# Patient Record
Sex: Male | Born: 1970 | Race: White | Hispanic: No | Smoking: Never smoker
Health system: Southern US, Community
[De-identification: ages and names within clinical notes are randomized; demographics above are authoritative.]

## PROBLEM LIST (undated history)

## (undated) DIAGNOSIS — F410 Panic disorder [episodic paroxysmal anxiety] without agoraphobia: Secondary | ICD-10-CM

## (undated) DIAGNOSIS — I872 Venous insufficiency (chronic) (peripheral): Secondary | ICD-10-CM

## (undated) DIAGNOSIS — K602 Anal fissure, unspecified: Secondary | ICD-10-CM

## (undated) DIAGNOSIS — G4730 Sleep apnea, unspecified: Secondary | ICD-10-CM

## (undated) DIAGNOSIS — M549 Dorsalgia, unspecified: Secondary | ICD-10-CM

## (undated) DIAGNOSIS — F1010 Alcohol abuse, uncomplicated: Secondary | ICD-10-CM

## (undated) DIAGNOSIS — I2510 Atherosclerotic heart disease of native coronary artery without angina pectoris: Secondary | ICD-10-CM

## (undated) DIAGNOSIS — R002 Palpitations: Secondary | ICD-10-CM

## (undated) DIAGNOSIS — I10 Essential (primary) hypertension: Secondary | ICD-10-CM

## (undated) DIAGNOSIS — R12 Heartburn: Secondary | ICD-10-CM

## (undated) DIAGNOSIS — R7303 Prediabetes: Secondary | ICD-10-CM

## (undated) DIAGNOSIS — K219 Gastro-esophageal reflux disease without esophagitis: Secondary | ICD-10-CM

## (undated) DIAGNOSIS — R569 Unspecified convulsions: Secondary | ICD-10-CM

## (undated) DIAGNOSIS — M7989 Other specified soft tissue disorders: Secondary | ICD-10-CM

## (undated) DIAGNOSIS — K921 Melena: Secondary | ICD-10-CM

## (undated) DIAGNOSIS — K580 Irritable bowel syndrome with diarrhea: Secondary | ICD-10-CM

## (undated) DIAGNOSIS — R079 Chest pain, unspecified: Secondary | ICD-10-CM

## (undated) DIAGNOSIS — E785 Hyperlipidemia, unspecified: Secondary | ICD-10-CM

## (undated) DIAGNOSIS — J45909 Unspecified asthma, uncomplicated: Secondary | ICD-10-CM

## (undated) DIAGNOSIS — R5600 Simple febrile convulsions: Secondary | ICD-10-CM

## (undated) DIAGNOSIS — R0602 Shortness of breath: Secondary | ICD-10-CM

## (undated) DIAGNOSIS — F329 Major depressive disorder, single episode, unspecified: Secondary | ICD-10-CM

## (undated) DIAGNOSIS — F419 Anxiety disorder, unspecified: Secondary | ICD-10-CM

## (undated) DIAGNOSIS — T7840XA Allergy, unspecified, initial encounter: Secondary | ICD-10-CM

## (undated) DIAGNOSIS — F1990 Other psychoactive substance use, unspecified, uncomplicated: Secondary | ICD-10-CM

## (undated) DIAGNOSIS — F32A Depression, unspecified: Secondary | ICD-10-CM

## (undated) HISTORY — DX: Unspecified asthma, uncomplicated: J45.909

## (undated) HISTORY — DX: Alcohol abuse, uncomplicated: F10.10

## (undated) HISTORY — DX: Depression, unspecified: F32.A

## (undated) HISTORY — DX: Major depressive disorder, single episode, unspecified: F32.9

## (undated) HISTORY — DX: Chest pain, unspecified: R07.9

## (undated) HISTORY — DX: Palpitations: R00.2

## (undated) HISTORY — PX: CARDIAC CATHETERIZATION: SHX172

## (undated) HISTORY — DX: Simple febrile convulsions: R56.00

## (undated) HISTORY — DX: Dorsalgia, unspecified: M54.9

## (undated) HISTORY — DX: Hyperlipidemia, unspecified: E78.5

## (undated) HISTORY — DX: Anal fissure, unspecified: K60.2

## (undated) HISTORY — DX: Prediabetes: R73.03

## (undated) HISTORY — DX: Melena: K92.1

## (undated) HISTORY — DX: Other specified soft tissue disorders: M79.89

## (undated) HISTORY — DX: Allergy, unspecified, initial encounter: T78.40XA

## (undated) HISTORY — DX: Sleep apnea, unspecified: G47.30

## (undated) HISTORY — DX: Panic disorder (episodic paroxysmal anxiety): F41.0

## (undated) HISTORY — PX: HEMORRHOID BANDING: SHX5850

## (undated) HISTORY — PX: SIGMOIDOSCOPY: SUR1295

## (undated) HISTORY — DX: Shortness of breath: R06.02

## (undated) HISTORY — DX: Heartburn: R12

## (undated) HISTORY — DX: Essential (primary) hypertension: I10

## (undated) HISTORY — DX: Gastro-esophageal reflux disease without esophagitis: K21.9

## (undated) HISTORY — DX: Venous insufficiency (chronic) (peripheral): I87.2

## (undated) HISTORY — DX: Anxiety disorder, unspecified: F41.9

## (undated) HISTORY — DX: Unspecified convulsions: R56.9

## (undated) HISTORY — DX: Other psychoactive substance use, unspecified, uncomplicated: F19.90

## (undated) HISTORY — DX: Irritable bowel syndrome with diarrhea: K58.0

---
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
## 2003-12-29 HISTORY — PX: COLONOSCOPY: SHX174

## 2012-11-22 ENCOUNTER — Ambulatory Visit (INDEPENDENT_AMBULATORY_CARE_PROVIDER_SITE_OTHER): Payer: BC Managed Care – PPO | Admitting: Family Medicine

## 2012-11-22 ENCOUNTER — Encounter: Payer: Self-pay | Admitting: Family Medicine

## 2012-11-22 VITALS — BP 136/82 | HR 72 | Temp 97.6°F | Resp 12 | Ht 69.0 in | Wt 278.0 lb

## 2012-11-22 DIAGNOSIS — F32A Depression, unspecified: Secondary | ICD-10-CM | POA: Insufficient documentation

## 2012-11-22 DIAGNOSIS — E669 Obesity, unspecified: Secondary | ICD-10-CM

## 2012-11-22 DIAGNOSIS — I10 Essential (primary) hypertension: Secondary | ICD-10-CM

## 2012-11-22 DIAGNOSIS — Z8719 Personal history of other diseases of the digestive system: Secondary | ICD-10-CM | POA: Insufficient documentation

## 2012-11-22 DIAGNOSIS — Z87898 Personal history of other specified conditions: Secondary | ICD-10-CM | POA: Insufficient documentation

## 2012-11-22 DIAGNOSIS — F410 Panic disorder [episodic paroxysmal anxiety] without agoraphobia: Secondary | ICD-10-CM | POA: Insufficient documentation

## 2012-11-22 DIAGNOSIS — F329 Major depressive disorder, single episode, unspecified: Secondary | ICD-10-CM | POA: Insufficient documentation

## 2012-11-22 DIAGNOSIS — Z0000 Encounter for general adult medical examination without abnormal findings: Secondary | ICD-10-CM

## 2012-11-22 DIAGNOSIS — K219 Gastro-esophageal reflux disease without esophagitis: Secondary | ICD-10-CM | POA: Insufficient documentation

## 2012-11-22 LAB — POCT URINALYSIS DIPSTICK
Bilirubin, UA: NEGATIVE
Blood, UA: NEGATIVE
Glucose, UA: NEGATIVE
Ketones, UA: NEGATIVE
Leukocytes, UA: NEGATIVE
Nitrite, UA: NEGATIVE
Protein, UA: NEGATIVE
Spec Grav, UA: 1.02
Urobilinogen, UA: 0.2
pH, UA: 7

## 2012-11-22 LAB — BASIC METABOLIC PANEL WITH GFR
BUN: 21 mg/dL (ref 6–23)
CO2: 26 meq/L (ref 19–32)
Calcium: 8.9 mg/dL (ref 8.4–10.5)
Chloride: 104 meq/L (ref 96–112)
Creatinine, Ser: 1.1 mg/dL (ref 0.4–1.5)
GFR: 76.75 mL/min
Glucose, Bld: 93 mg/dL (ref 70–99)
Potassium: 3.7 meq/L (ref 3.5–5.1)
Sodium: 138 meq/L (ref 135–145)

## 2012-11-22 LAB — HEPATIC FUNCTION PANEL
ALT: 38 U/L (ref 0–53)
AST: 29 U/L (ref 0–37)
Albumin: 4 g/dL (ref 3.5–5.2)
Alkaline Phosphatase: 65 U/L (ref 39–117)
Bilirubin, Direct: 0.1 mg/dL (ref 0.0–0.3)
Total Bilirubin: 0.9 mg/dL (ref 0.3–1.2)
Total Protein: 7.2 g/dL (ref 6.0–8.3)

## 2012-11-22 LAB — CBC WITH DIFFERENTIAL/PLATELET
Basophils Absolute: 0 10*3/uL (ref 0.0–0.1)
Eosinophils Absolute: 0.2 10*3/uL (ref 0.0–0.7)
Lymphocytes Relative: 27.3 % (ref 12.0–46.0)
MCHC: 33.6 g/dL (ref 30.0–36.0)
Neutrophils Relative %: 62 % (ref 43.0–77.0)
RBC: 5.01 Mil/uL (ref 4.22–5.81)
RDW: 12.7 % (ref 11.5–14.6)

## 2012-11-22 LAB — TSH: TSH: 1.44 u[IU]/mL (ref 0.35–5.50)

## 2012-11-22 LAB — LIPID PANEL
Cholesterol: 193 mg/dL (ref 0–200)
LDL Cholesterol: 129 mg/dL — ABNORMAL HIGH (ref 0–99)
Triglycerides: 151 mg/dL — ABNORMAL HIGH (ref 0.0–149.0)
VLDL: 30.2 mg/dL (ref 0.0–40.0)

## 2012-11-22 NOTE — Patient Instructions (Signed)
Work on weight loss and overall calorie restriction. We will call you with nutrition referral.

## 2012-11-22 NOTE — Progress Notes (Signed)
Subjective:    Patient ID: Hector [name], male    DOB: 11-Aug-1971, 41 y.o.   MRN: [id number]  HPI  New to establish. Patient requesting wellness visit. Past medical history reviewed. Past history of anal fissures. He's had anxiety disorder with what sounds like panic disorder. Occasional episode of depression. He is maintained on Prozac 20 mg daily for several years and this is doing a fairly good job suppressing anxiety symptoms. He has occasional breakthrough symptoms and has used low-dose alprazolam for that. He has history of GERD currently stable off medication. History of physiologic jaundice at birth. Hypertension treated with beta blocker- Bystolic 10 mg once daily. Patient also has history of febrile seizures in childhood but no seizure since then. Only surgery was wisdom teeth extraction.  Family history significant for mother with Alzheimer's disease. His father had history of hypertension and type 2 diabetes. Hyperlipidemia in both parents.  Gradual weight gain over the past several years. No regular exercise. Nonsmoker. Occasional alcohol use. Tetanus 2010. Flu vaccine earlier this year. Colonoscopy 2005 to evaluate his rectal bleeding with apparently no significant abnormalities.  Past Medical History  Diagnosis Date  . Hypertension   . Depression   . Anxiety   . Seizures     febrile  . Jaundice, newborn   . Blood in stool   . Panic disorder   . Allergy    Past Surgical History  Procedure Date  . Wisdom tooth extraction 1991    reports that he has never smoked. He does not have any smokeless tobacco history on file. His alcohol and drug histories not on file. family history includes Dementia in his mother; Diabetes in his father; Heart disease (age of onset:58) in his father; Hyperlipidemia in his father; Hypertension in his mother; Skin cancer in his mother; and Stroke in his father and mother. No Known Allergies    Review of Systems  Constitutional: Positive for  unexpected weight change (Weight gain, not really unexpected). Negative for fever, activity change, appetite change and fatigue.  HENT: Negative for ear pain, congestion and trouble swallowing.   Eyes: Negative for pain and visual disturbance.  Respiratory: Negative for cough, shortness of breath and wheezing.   Cardiovascular: Negative for chest pain and palpitations.  Gastrointestinal: Negative for nausea, vomiting, abdominal pain, diarrhea, constipation, blood in stool, abdominal distention and rectal pain.  Genitourinary: Negative for dysuria, hematuria and testicular pain.  Musculoskeletal: Negative for joint swelling and arthralgias.  Skin: Negative for rash.  Neurological: Negative for dizziness, syncope and headaches.  Hematological: Negative for adenopathy.  Psychiatric/Behavioral: Negative for confusion and dysphoric mood.       Objective:   Physical Exam  Constitutional: He is oriented to person, place, and time. He appears well-developed and well-nourished. No distress.       Obese pleasant gentleman in no distress  HENT:  Head: Normocephalic and atraumatic.  Right Ear: External ear normal.  Left Ear: External ear normal.  Mouth/Throat: Oropharynx is clear and moist.  Eyes: Conjunctivae normal and EOM are normal. Pupils are equal, round, and reactive to light.  Neck: Normal range of motion. Neck supple. No thyromegaly present.  Cardiovascular: Normal rate, regular rhythm and normal heart sounds.   No murmur heard. Pulmonary/Chest: No respiratory distress. He has no wheezes. He has no rales.  Abdominal: Soft. Bowel sounds are normal. He exhibits no distension and no mass. There is no tenderness. There is no rebound and no guarding.  Musculoskeletal: He exhibits no edema.  Lymphadenopathy:    He has no cervical adenopathy.  Neurological: He is alert and oriented to person, place, and time. He displays normal reflexes. No cranial nerve deficit.  Skin: No rash noted.    Psychiatric: He has a normal mood and affect.          Assessment & Plan:  Health maintenance. Patient has history of hypertension which has been marginally controlled. Consistent weight gain apparently over several years. Obtain screening labs. We talked at some length about strategies for weight loss. Set up nutrition referral. Routine followup 6 months  Anxiety disorder (?panic disorder) mostly controlled with Prozac with prn alprazolam.

## 2012-11-23 NOTE — Progress Notes (Signed)
Quick Note:  Pt informed on personally identified ______ 

## 2012-11-29 ENCOUNTER — Encounter: Payer: Self-pay | Admitting: Family Medicine

## 2012-12-06 ENCOUNTER — Encounter: Payer: Self-pay | Admitting: Family Medicine

## 2012-12-08 MED ORDER — METOPROLOL SUCCINATE ER 50 MG PO TB24: 50.0000 mg | ORAL_TABLET | Freq: Every day | ORAL | Status: DC

## 2012-12-08 NOTE — Telephone Encounter (Signed)
Pt informed Rx sent to [facility]

## 2012-12-30 ENCOUNTER — Encounter: Payer: BC Managed Care – PPO | Attending: Family Medicine | Admitting: Dietician

## 2012-12-30 ENCOUNTER — Encounter: Payer: Self-pay | Admitting: Dietician

## 2012-12-30 VITALS — Ht 69.5 in | Wt 279.6 lb

## 2012-12-30 DIAGNOSIS — Z713 Dietary counseling and surveillance: Secondary | ICD-10-CM | POA: Insufficient documentation

## 2012-12-30 DIAGNOSIS — E669 Obesity, unspecified: Secondary | ICD-10-CM

## 2012-12-30 NOTE — Patient Instructions (Addendum)
Goals:  Pt will incorporate 30 minutes for 5 days a week of physical activity such as walking. These should be done in at least 10 minutes increments ideally. Pt will reduce portion size of meats to 4-5 oz. (approximately 1.5 decks of cards), and follow plate method to make half of dinner plate vegetables, 1/4 starch, and 1/4 protein.  Pt will incorporate extra vegetables into meals that traditionally have little- such as large salads with pizza or lots of vegetable toppings with pizzas.  Because pt dislikes leftovers, he and his wife will cook smaller amounts of meats, starches for dinner to reduce waste, encourage eating of more vegetables for fullness. Pt will eat more fruit in response to cravings for sweets, eating fruit at least once per day.  Pt will choose leaner items for breakfast, such as an egg with fruit, or whole grain cereals such as Kashi and Honey Nut Cheerios. Pt may consider slim fast shake as meal replacement for on-the-go eating. Pt will pack lunch 3 days per week. In his lunch, pt will include a whole grain item (such as whole corn or corn tortilla, whole wheat bread, or baked potato), one or more veg/fruit, and one lean protein (fish, poultry, tofu, or loin cut of red meat).  Pt will continue to choose lighter items for dessert, and will control portion to single servings (one pudding cup, for example). Pt will choose lighter forms of fatty dressing, such as oil-based salad dressing and plain yogurt in favor of sour cream.

## 2012-12-30 NOTE — Progress Notes (Signed)
Medical Nutrition Therapy:  Appt start time: 1030 end time:  1130.  Assessment:  Primary concerns today: wt gain.   Pt reports ankle/foot injury during last attempt at wt reduction (2011- was down to 243 from about 270), which has reduced PA to essentially none, and gradual wt gain since then to current wt.  MEDICATIONS: see list.   DIETARY INTAKE:  Usual eating pattern includes 3 meals and 1 snacks per day.  Everyday foods include granola bars, diet sodas.  Avoided foods include shellfish, cauliflower, broccoli, carrots.  24-hr recall:  B ( AM): chicken biscuit, with clif bar or chewy granola bar. Sometimes has a smartones breakfast sandwich. Banana Snk ( AM): none  L ( PM): goes out basically every day for lunch: [employer] sub (italian night club on french bread + chips and pickle), indian (rice, naan, chix, rice pudding- 2 plates worth) or [place] food (chix or beef curries with rice- 2 plates), [place] food ([place] chix or beef, lots of chips and salsa, plus rice and refried beans). Snk ( PM): candy bar (normal size), but recently has stopped D ( PM): wife cooks- usually cooks vegetarian chili with frozen chix, pasta with red sauce, or light cream sauce, frozen pizza, tilapia to grill, breaded filet of fish, tuna casserole, honey mustard chix or meatloaf on occasion. Starches- mashed potatoes, frozen french fries, white rice or jasmine rice- mainly mashed potatoes and french fries as starch, corn, sometimes garlic bread. Veg- corn as a veg, tomatoes, green beans, asparagus, garlic, onions, spicy and mild peppers.  Snk ( PM): ice cream (light). Craves sweets after dinner, so ate froot loops. Sometimes eats granola bar. Fat free or sugar free pudding.  Beverages: unsweetened almond milk, artificially sweetened soda (with stevia). Sometimes gets regular soda when he goes out to eat. Drinks about 20 oz. Bottled water per day. Drinks some sparkling water.   Pt states he likes a lot of  fruits, but doesn't eat them regularly.   Pt states significant cravings for a number of foods, which he attempts to control.  Usual physical activity: essentially none.   Progress Towards Goal(s):  In progress.   Nutritional Diagnosis:  NI-1.5 Excessive energy intake As related to very low PA, large portion sizes.  As evidenced by BMI>30.    Intervention:  Nutrition counseling provided regarding portion sizes, meal composition, water, fruit and vegetable consumption. Pt counseled regarding choosing lower fat options, reducing kcal density of foods generally, controlling portions, especially of meat and starch.   Pt counseled regarding increasing physical activity as tolerated.   Goals:  Pt will incorporate 30 minutes for 5 days a week of physical activity such as walking. These should be done in at least 10 minutes increments ideally. Pt will reduce portion size of meats to 4-5 oz. (approximately 1.5 decks of cards), and follow plate method to make half of dinner plate vegetables, 1/4 starch, and 1/4 protein.  Pt will incorporate extra vegetables into meals that traditionally have little- such as large salads with pizza or lots of vegetable toppings with pizzas.  Because pt dislikes leftovers, he and his wife will cook smaller amounts of meats, starches for dinner to reduce waste, encourage eating of more vegetables for fullness. Pt will eat more fruit in response to cravings for sweets, eating fruit at least once per day.  Pt will choose leaner items for breakfast, such as an egg with fruit, or whole grain cereals such as Kashi and Honey Nut Cheerios. Pt may  consider slim fast shake as meal replacement for on-the-go eating. Pt will pack lunch 3 days per week. In his lunch, pt will include a whole grain item (such as whole corn or corn tortilla, whole wheat bread, or baked potato), one or more veg/fruit, and one lean protein (fish, poultry, tofu, or loin cut of red meat).  Pt will continue  to choose lighter items for dessert, and will control portion to single servings (one pudding cup, for example). Pt will choose lighter forms of fatty dressing, such as oil-based salad dressing and plain yogurt in favor of sour cream.  Handouts given during visit include:  serving size card  Plate method  Monitoring/Evaluation:  Dietary intake, exercise, portion control, and body weight in 1 month(s).

## 2013-02-03 ENCOUNTER — Ambulatory Visit: Payer: BC Managed Care – PPO | Admitting: Dietician

## 2013-02-12 ENCOUNTER — Other Ambulatory Visit: Payer: Self-pay

## 2013-04-24 ENCOUNTER — Telehealth: Payer: Self-pay | Admitting: Family Medicine

## 2013-04-24 MED ORDER — FLUOXETINE HCL 20 MG PO CAPS: 20.0000 mg | ORAL_CAPSULE | Freq: Every day | ORAL | Status: DC

## 2013-04-24 NOTE — Telephone Encounter (Signed)
[address] [place], [place] [address] p. [phone] f. [phone] To: [facility] (After Hours Triage) Fax: [phone] From: [facility] Date/ Time: 04/21/2013 6:15 PM Taken By: Jethro Bolus: [place] Facility: not collected Patient: Hector [name], Hector [name] DOB: July 16, 1971 Phone: [phone] Reason for Call: He is out of his Ativan 0.5mg  tabs. Has been taking an expired script all week and told his wife this morning. He called but didn't leave a message per [facility]. She said that he will be fine over the weekend, just needs to get a short term amount called to [facility] [address] Monday am and then a script faxed to [facility] for mail order. Also needs a script sent to [facility] for the Prozac. Has 6 pills left to get through the weekend. Neither of these scripts have previously been ordered by Dr. Caryl Never, just discussed on his initial visit. DMM/RN/CAN Regarding Appointment: Appt Date: Appt Time: Unknown Confidential

## 2013-04-24 NOTE — Telephone Encounter (Signed)
This was a new patient to establish on 11/22/12 and you had requested a return OV in 6 months.  These meds have not been filled by [place] yet, so he would be due for OV end of May.   Please advise

## 2013-04-24 NOTE — Telephone Encounter (Signed)
Refill Prozac for one month and schedule follow up. Our med list states alprazolam, not Ativan.

## 2013-05-16 ENCOUNTER — Telehealth: Payer: Self-pay | Admitting: Family Medicine

## 2013-05-16 MED ORDER — NEBIVOLOL HCL 10 MG PO TABS: 10.0000 mg | ORAL_TABLET | Freq: Every day | ORAL | Status: DC

## 2013-05-16 NOTE — Telephone Encounter (Signed)
Patient's wife called stating that her husband needs 3 of the bystolic 10 mg 1poqd sent to [facility] [address] until he receives his mail order rx. Please assist.

## 2013-05-26 ENCOUNTER — Other Ambulatory Visit: Payer: Self-pay | Admitting: Family Medicine

## 2013-05-29 ENCOUNTER — Encounter: Payer: Self-pay | Admitting: Family Medicine

## 2013-05-29 ENCOUNTER — Ambulatory Visit (INDEPENDENT_AMBULATORY_CARE_PROVIDER_SITE_OTHER): Payer: BC Managed Care – PPO | Admitting: Family Medicine

## 2013-05-29 VITALS — BP 120/78 | HR 68 | Temp 97.5°F | Resp 18 | Wt 272.0 lb

## 2013-05-29 DIAGNOSIS — I10 Essential (primary) hypertension: Secondary | ICD-10-CM

## 2013-05-29 DIAGNOSIS — F410 Panic disorder [episodic paroxysmal anxiety] without agoraphobia: Secondary | ICD-10-CM

## 2013-05-29 MED ORDER — LORAZEPAM 1 MG PO TABS: 1.0000 mg | ORAL_TABLET | Freq: Four times a day (QID) | ORAL | Status: DC | PRN

## 2013-05-29 MED ORDER — FLUOXETINE HCL 20 MG PO CAPS: ORAL_CAPSULE | ORAL | Status: DC

## 2013-05-29 MED ORDER — NEBIVOLOL HCL 10 MG PO TABS: 10.0000 mg | ORAL_TABLET | Freq: Every day | ORAL | Status: DC

## 2013-05-29 NOTE — Progress Notes (Signed)
  Subjective:    Patient ID: Hector [name], male    DOB: 02-06-1971, 42 y.o.   MRN: [id number]  HPI Medical followup. Patient has history of hypertension, panic disorder, GERD. Takes fluoxetine 20 mg daily. Panic disorder generally fairly well controlled. In the past has taken alprazolam as needed. Generally has about one panic attack every 2-4 weeks. He's also use benzodiazepines for thing like flying. Job requires travel.  Hypertension treated with by Bystolic 10 mg daily. Blood pressure well controlled. No dizziness. No side effects.  Patient is nonsmoker. Recently joined [employer].  Past Medical History  Diagnosis Date  . Hypertension   . Depression   . Anxiety   . Seizures     febrile  . Jaundice, newborn   . Blood in stool   . Panic disorder   . Allergy    Past Surgical History  Procedure Laterality Date  . Wisdom tooth extraction  1991    reports that he has never smoked. He does not have any smokeless tobacco history on file. His alcohol and drug histories are not on file. family history includes Dementia in his mother; Diabetes in his father; Heart disease (age of onset: 59) in his father; Hyperlipidemia in his father; Hypertension in his mother; Skin cancer in his mother; and Stroke in his father and mother. No Known Allergies    Review of Systems  Constitutional: Negative for fatigue.  Eyes: Negative for visual disturbance.  Respiratory: Negative for cough, chest tightness and shortness of breath.   Cardiovascular: Negative for chest pain, palpitations and leg swelling.  Neurological: Negative for dizziness, syncope, weakness, light-headedness and headaches.       Objective:   Physical Exam  Constitutional: He appears well-developed and well-nourished.  HENT:  Right Ear: External ear normal.  Left Ear: External ear normal.  Mouth/Throat: Oropharynx is clear and moist.  Neck: Neck supple. No thyromegaly present.  Cardiovascular: Normal rate and  regular rhythm.  Exam reveals no gallop.   No murmur heard. Pulmonary/Chest: Effort normal and breath sounds normal. No respiratory distress. He has no wheezes. He has no rales.  Musculoskeletal: He exhibits no edema.  Lymphadenopathy:    He has no cervical adenopathy.          Assessment & Plan:  #1 hypertension. Adequate control. Refill medication for one year #2 history of panic disorder. Generally well-controlled. Refill fluoxetine for one year. Limited lorazepam 1 mg when necessary for severe anxiety. He'll also use pre-flying

## 2013-11-02 ENCOUNTER — Other Ambulatory Visit: Payer: Self-pay | Admitting: Family Medicine

## 2013-11-02 ENCOUNTER — Other Ambulatory Visit: Payer: Self-pay

## 2013-12-11 ENCOUNTER — Encounter: Payer: Self-pay | Admitting: Physician Assistant

## 2013-12-11 ENCOUNTER — Ambulatory Visit (INDEPENDENT_AMBULATORY_CARE_PROVIDER_SITE_OTHER): Payer: BC Managed Care – PPO | Admitting: Physician Assistant

## 2013-12-11 VITALS — BP 128/74 | HR 65 | Temp 97.7°F | Resp 20 | Wt 275.0 lb

## 2013-12-11 DIAGNOSIS — J020 Streptococcal pharyngitis: Secondary | ICD-10-CM

## 2013-12-11 DIAGNOSIS — J029 Acute pharyngitis, unspecified: Secondary | ICD-10-CM

## 2013-12-11 MED ORDER — AMOXICILLIN 875 MG PO TABS: 875.0000 mg | ORAL_TABLET | Freq: Two times a day (BID) | ORAL | Status: DC

## 2013-12-11 NOTE — Patient Instructions (Signed)
Increase fluid intake.  Rest.  Take antibiotic as prescribed with food.  Saline nasal spray.  Daily claritin.  Warm liquids, salt-water gargles and Tylenol for throat pain.  Change out toothbrush in 48 hours of being on antibiotic.

## 2013-12-12 ENCOUNTER — Encounter: Payer: Self-pay | Admitting: Physician Assistant

## 2013-12-12 DIAGNOSIS — J020 Streptococcal pharyngitis: Secondary | ICD-10-CM | POA: Insufficient documentation

## 2013-12-12 NOTE — Assessment & Plan Note (Signed)
Rapid strep positive.  Rx Amoxicillin 875 BID x 10 days.  Probiotic.  Rest.  Increase fluid intake.  Tylenol, salt-water gargles for throat pain.  Humidifier in bedroom.  Saline nasal spray.  Patient contagious for 24 hours after initiation of antibiotic therapy.  Note given for work.  Change out toothbrush after being on antibiotics to avoid re-inoculation.

## 2013-12-12 NOTE — Progress Notes (Signed)
Patient ID: Hector [name], male   DOB: [birth year], 42 y.o.   MRN: [id number]  Patient presents to clinic today c/o 2 days of a severely sore throat, intermittent fevers and sinus pressure.  Patient also endorses a mild, dry cough since this morning.  Patient denies hx of asthma, denies shortness of breath or wheezing.  Denies sinus pain.  Endorses mild R ear pain.  Denies rash.  Daughter recently with strep throat.  Denies recent travel.   Past Medical History  Diagnosis Date  . Hypertension   . Depression   . Anxiety   . Seizures     febrile  . Jaundice, newborn   . Blood in stool   . Panic disorder   . Allergy     Current Outpatient Prescriptions on File Prior to Visit  Medication Sig Dispense Refill  . FLUoxetine (PROZAC) 20 MG capsule TAKE ONE CAPSULE BY MOUTH DAILY  15 capsule  0  . FLUoxetine (PROZAC) 20 MG capsule TAKE 1 CAPSULE DAILY  90 capsule  1  . LORazepam (ATIVAN) 1 MG tablet Take 1 tablet (1 mg total) by mouth every 6 (six) hours as needed for anxiety.  30 tablet  2  . nebivolol (BYSTOLIC) 10 MG tablet Take 1 tablet (10 mg total) by mouth daily.  90 tablet  1  . clotrimazole-betamethasone (LOTRISONE) cream Apply 1 application topically 2 (two) times daily as needed.        No current facility-administered medications on file prior to visit.    No Known Allergies  Family History  Problem Relation Age of Onset  . Dementia Mother   . Stroke Mother   . Hypertension Mother   . Skin cancer Mother     basal and squamos  . Diabetes Father   . Hyperlipidemia Father   . Stroke Father   . Heart disease Father 57    History   Social History  . Marital Status: Married    Spouse Name: N/A    Number of Children: N/A  . Years of Education: N/A   Social History Main Topics  . Smoking status: Never Smoker   . Smokeless tobacco: Not on file  . Alcohol Use: Not on file  . Drug Use: Not on file  . Sexual Activity: Not on file   Other Topics Concern  . Not on file    Social History Narrative  . No narrative on file   Review of Systems - See HPI.  All other ROS are negative.  Filed Vitals:   12/11/13 1642  BP: 128/74  Pulse: 65  Temp: 97.7 F (36.5 C)  Resp: 20   Physical Exam  Vitals reviewed. Constitutional: He is oriented to person, place, and time and well-developed, well-nourished, and in no distress.  HENT:  Head: Normocephalic and atraumatic.  Right Ear: External ear normal.  Left Ear: External ear normal.  Nose: Nose normal.  Mouth/Throat: Oropharyngeal exudate present.  TM within normal limits bilaterally.  No tenderness to percussion of sinuses noted.  Positive posterior oropharyngeal erythema, tonsillar adenopathy and tonsillar exudate.  Uvula midline.  No evidence of peritonsillar abscess on examination.  Eyes: Conjunctivae are normal.  Neck: Neck supple.  Cardiovascular: Normal rate, regular rhythm, normal heart sounds and intact distal pulses.   Pulmonary/Chest: Effort normal and breath sounds normal. No respiratory distress. He has no wheezes. He has no rales. He exhibits no tenderness.  Lymphadenopathy:    He has cervical adenopathy.  Neurological: He is alert and  oriented to person, place, and time.  Skin: Skin is warm and dry. No rash noted.  Psychiatric: Affect normal.     Recent Results (from the past 2160 hour(s))  POCT RAPID STREP A (OFFICE)     Status: Abnormal   Collection Time    12/11/13  5:09 PM      Result Value Range   Rapid Strep A Screen Positive (*) Negative    Assessment/Plan: Streptococcal sore throat Rapid strep positive.  Rx Amoxicillin 875 BID x 10 days.  Probiotic.  Rest.  Increase fluid intake.  Tylenol, salt-water gargles for throat pain.  Humidifier in bedroom.  Saline nasal spray.  Patient contagious for 24 hours after initiation of antibiotic therapy.  Note given for work.  Change out toothbrush after being on antibiotics to avoid re-inoculation.

## 2014-01-08 ENCOUNTER — Telehealth: Payer: Self-pay | Admitting: Family Medicine

## 2014-01-08 ENCOUNTER — Other Ambulatory Visit: Payer: Self-pay | Admitting: Family Medicine

## 2014-01-08 NOTE — Telephone Encounter (Signed)
Refill once 

## 2014-01-08 NOTE — Telephone Encounter (Signed)
Last visit 05/29/13 Last refill 05/29/13 #30 2 refill

## 2014-01-08 NOTE — Telephone Encounter (Addendum)
Pt request refill of LORazepam (ATIVAN) 1 MG tablet Pt has cpe scheduled 01/22/14 [facility]

## 2014-01-09 MED ORDER — LORAZEPAM 1 MG PO TABS: 1.0000 mg | ORAL_TABLET | Freq: Four times a day (QID) | ORAL | Status: DC | PRN

## 2014-01-09 NOTE — Telephone Encounter (Signed)
RX called into pharmacy

## 2014-01-15 ENCOUNTER — Other Ambulatory Visit: Payer: Self-pay | Admitting: Family Medicine

## 2014-01-17 ENCOUNTER — Other Ambulatory Visit (INDEPENDENT_AMBULATORY_CARE_PROVIDER_SITE_OTHER): Payer: BC Managed Care – PPO

## 2014-01-17 DIAGNOSIS — Z0000 Encounter for general adult medical examination without abnormal findings: Secondary | ICD-10-CM

## 2014-01-17 LAB — POCT URINALYSIS DIPSTICK
BILIRUBIN UA: NEGATIVE
Blood, UA: NEGATIVE
GLUCOSE UA: NEGATIVE
Ketones, UA: NEGATIVE
LEUKOCYTES UA: NEGATIVE
NITRITE UA: NEGATIVE
Protein, UA: NEGATIVE
Spec Grav, UA: >=1.03
Urobilinogen, UA: 0.2
pH, UA: 5.5

## 2014-01-17 LAB — CBC WITH DIFFERENTIAL/PLATELET
BASOS ABS: 0 10*3/uL (ref 0.0–0.1)
BASOS PCT: 0.2 % (ref 0.0–3.0)
EOS ABS: 0.2 10*3/uL (ref 0.0–0.7)
Eosinophils Relative: 2.7 % (ref 0.0–5.0)
HCT: 44.4 % (ref 39.0–52.0)
Hemoglobin: 15.5 g/dL (ref 13.0–17.0)
Lymphocytes Relative: 25 % (ref 12.0–46.0)
Lymphs Abs: 1.6 10*3/uL (ref 0.7–4.0)
MCHC: 34.9 g/dL (ref 30.0–36.0)
MCV: 87.8 fl (ref 78.0–100.0)
MONO ABS: 0.4 10*3/uL (ref 0.1–1.0)
Monocytes Relative: 6 % (ref 3.0–12.0)
NEUTROS ABS: 4.2 10*3/uL (ref 1.4–7.7)
NEUTROS PCT: 66.1 % (ref 43.0–77.0)
Platelets: 169 10*3/uL (ref 150.0–400.0)
RBC: 5.06 Mil/uL (ref 4.22–5.81)
RDW: 12.7 % (ref 11.5–14.6)
WBC: 6.3 10*3/uL (ref 4.5–10.5)

## 2014-01-17 LAB — BASIC METABOLIC PANEL
BUN: 15 mg/dL (ref 6–23)
CHLORIDE: 106 meq/L (ref 96–112)
CO2: 28 meq/L (ref 19–32)
CREATININE: 1.1 mg/dL (ref 0.4–1.5)
Calcium: 8.9 mg/dL (ref 8.4–10.5)
GFR: 79.59 mL/min (ref >60.00)
GLUCOSE: 88 mg/dL (ref 70–99)
Potassium: 3.9 mEq/L (ref 3.5–5.1)
Sodium: 140 mEq/L (ref 135–145)

## 2014-01-17 LAB — LIPID PANEL
CHOL/HDL RATIO: 5
Cholesterol: 175 mg/dL (ref 0–200)
HDL: 32.2 mg/dL — ABNORMAL LOW (ref >39.00)
LDL Cholesterol: 117 mg/dL — ABNORMAL HIGH (ref 0–99)
TRIGLYCERIDES: 131 mg/dL (ref 0.0–149.0)
VLDL: 26.2 mg/dL (ref 0.0–40.0)

## 2014-01-17 LAB — TSH: TSH: 1.8 u[IU]/mL (ref 0.35–5.50)

## 2014-01-17 LAB — HEPATIC FUNCTION PANEL
ALK PHOS: 70 U/L (ref 39–117)
ALT: 26 U/L (ref 0–53)
AST: 22 U/L (ref 0–37)
Albumin: 3.9 g/dL (ref 3.5–5.2)
BILIRUBIN DIRECT: 0.1 mg/dL (ref 0.0–0.3)
TOTAL PROTEIN: 7.1 g/dL (ref 6.0–8.3)
Total Bilirubin: 0.9 mg/dL (ref 0.3–1.2)

## 2014-01-22 ENCOUNTER — Encounter: Payer: Self-pay | Admitting: Family Medicine

## 2014-01-22 ENCOUNTER — Ambulatory Visit (INDEPENDENT_AMBULATORY_CARE_PROVIDER_SITE_OTHER): Payer: BC Managed Care – PPO | Admitting: Family Medicine

## 2014-01-22 VITALS — BP 128/70 | HR 58 | Temp 97.4°F | Ht 69.0 in | Wt 275.0 lb

## 2014-01-22 DIAGNOSIS — Z0000 Encounter for general adult medical examination without abnormal findings: Secondary | ICD-10-CM

## 2014-01-22 DIAGNOSIS — Z6839 Body mass index (BMI) 39.0-39.9, adult: Secondary | ICD-10-CM | POA: Insufficient documentation

## 2014-01-22 NOTE — Progress Notes (Signed)
   Subjective:    Patient ID: Hector [name], male    DOB: [birth year], 43 y.o.   MRN: [id number]  HPI Patient here for complete physical He has hypertension treated with Bystolic and fluoxetine for history of depression. Both are stable. His weight is relatively unchanged from last year. He does not exercise. He has previously done Weight Watchers but not currently. Poor compliance at times.  Family history is reviewed as below.  has a brother that has been diagnosed with ulcerative colitis. Patient has not had any diarrhea or stool changes. He had previous colonoscopy 2005 reportedly normal.  Past Medical History  Diagnosis Date  . Hypertension   . Depression   . Anxiety   . Seizures     febrile  . Jaundice, newborn   . Blood in stool   . Panic disorder   . Allergy    Past Surgical History  Procedure Laterality Date  . Wisdom tooth extraction  1991    reports that he has never smoked. He does not have any smokeless tobacco history on file. His alcohol and drug histories are not on file. family history includes Colitis in his brother; Dementia in his mother; Diabetes in his father; Heart disease (age of onset: 47) in his father; Hyperlipidemia in his father; Hypertension in his mother; Skin cancer in his mother; Stroke in his father and mother. No Known Allergies    Review of Systems  Constitutional: Negative for fever, activity change, appetite change and fatigue.  HENT: Negative for congestion, ear pain and trouble swallowing.   Eyes: Negative for pain and visual disturbance.  Respiratory: Negative for cough, shortness of breath and wheezing.   Cardiovascular: Negative for chest pain and palpitations.  Gastrointestinal: Negative for nausea, vomiting, abdominal pain, diarrhea, constipation, blood in stool, abdominal distention and rectal pain.  Endocrine: Negative for polydipsia and polyuria.  Genitourinary: Negative for dysuria, hematuria and testicular pain.    Musculoskeletal: Negative for arthralgias and joint swelling.  Skin: Negative for rash.  Neurological: Negative for dizziness, syncope and headaches.  Hematological: Negative for adenopathy.  Psychiatric/Behavioral: Negative for confusion and dysphoric mood.       Objective:   Physical Exam  Constitutional: He is oriented to person, place, and time. He appears well-developed and well-nourished. No distress.  HENT:  Head: Normocephalic and atraumatic.  Right Ear: External ear normal.  Left Ear: External ear normal.  Mouth/Throat: Oropharynx is clear and moist.  Eyes: Conjunctivae and EOM are normal. Pupils are equal, round, and reactive to light.  Neck: Normal range of motion. Neck supple. No thyromegaly present.  Cardiovascular: Normal rate, regular rhythm and normal heart sounds.   No murmur heard. Pulmonary/Chest: No respiratory distress. He has no wheezes. He has no rales.  Abdominal: Soft. Bowel sounds are normal. He exhibits no distension and no mass. There is no tenderness. There is no rebound and no guarding.  Genitourinary: Rectum normal and prostate normal.  Musculoskeletal: He exhibits no edema.  Lymphadenopathy:    He has no cervical adenopathy.  Neurological: He is alert and oriented to person, place, and time. He displays normal reflexes. No cranial nerve deficit.  Skin: No rash noted.  Psychiatric: He has a normal mood and affect.          Assessment & Plan:  Complete physical. Labs reviewed. History metabolic syndrome and triglycerides are now normal. We have strongly advocate weight loss and discussed some strategies. Continue current medications.

## 2014-01-22 NOTE — Patient Instructions (Signed)
Try to lose some weight. Continue current medications.

## 2014-01-22 NOTE — Progress Notes (Signed)
Pre visit review using our clinic review tool, if applicable. No additional management support is needed unless otherwise documented below in the visit note. 

## 2014-03-05 ENCOUNTER — Encounter: Payer: Self-pay | Admitting: Family

## 2014-03-05 ENCOUNTER — Ambulatory Visit (INDEPENDENT_AMBULATORY_CARE_PROVIDER_SITE_OTHER): Payer: BC Managed Care – PPO | Admitting: Family

## 2014-03-05 VITALS — BP 110/80 | HR 77 | Temp 97.3°F | Wt 259.0 lb

## 2014-03-05 DIAGNOSIS — A088 Other specified intestinal infections: Secondary | ICD-10-CM

## 2014-03-05 DIAGNOSIS — R197 Diarrhea, unspecified: Secondary | ICD-10-CM

## 2014-03-05 DIAGNOSIS — F411 Generalized anxiety disorder: Secondary | ICD-10-CM

## 2014-03-05 DIAGNOSIS — A084 Viral intestinal infection, unspecified: Secondary | ICD-10-CM

## 2014-03-05 DIAGNOSIS — R112 Nausea with vomiting, unspecified: Secondary | ICD-10-CM

## 2014-03-05 LAB — COMPREHENSIVE METABOLIC PANEL
ALBUMIN: 4.5 g/dL (ref 3.5–5.2)
ALK PHOS: 68 U/L (ref 39–117)
ALT: 26 U/L (ref 0–53)
AST: 18 U/L (ref 0–37)
BUN: 45 mg/dL — ABNORMAL HIGH (ref 6–23)
CO2: 20 mEq/L (ref 19–32)
Calcium: 9.2 mg/dL (ref 8.4–10.5)
Chloride: 109 mEq/L (ref 96–112)
Creatinine, Ser: 1.6 mg/dL — ABNORMAL HIGH (ref 0.4–1.5)
GFR: 49.12 mL/min — ABNORMAL LOW (ref >60.00)
Glucose, Bld: 110 mg/dL — ABNORMAL HIGH (ref 70–99)
POTASSIUM: 3.4 meq/L — AB (ref 3.5–5.1)
SODIUM: 138 meq/L (ref 135–145)
TOTAL PROTEIN: 7.9 g/dL (ref 6.0–8.3)
Total Bilirubin: 1 mg/dL (ref 0.3–1.2)

## 2014-03-05 LAB — CBC WITH DIFFERENTIAL/PLATELET
BASOS ABS: 0 10*3/uL (ref 0.0–0.1)
Basophils Relative: 0.3 % (ref 0.0–3.0)
EOS ABS: 0.2 10*3/uL (ref 0.0–0.7)
Eosinophils Relative: 2.4 % (ref 0.0–5.0)
HCT: 50.1 % (ref 39.0–52.0)
Hemoglobin: 16.9 g/dL (ref 13.0–17.0)
LYMPHS PCT: 19.4 % (ref 12.0–46.0)
Lymphs Abs: 1.7 10*3/uL (ref 0.7–4.0)
MCHC: 33.8 g/dL (ref 30.0–36.0)
MCV: 89.4 fl (ref 78.0–100.0)
Monocytes Absolute: 1 10*3/uL (ref 0.1–1.0)
Monocytes Relative: 10.9 % (ref 3.0–12.0)
NEUTROS PCT: 67 % (ref 43.0–77.0)
Neutro Abs: 5.9 10*3/uL (ref 1.4–7.7)
Platelets: 238 10*3/uL (ref 150.0–400.0)
RBC: 5.61 Mil/uL (ref 4.22–5.81)
RDW: 12.6 % (ref 11.5–14.6)
WBC: 8.9 10*3/uL (ref 4.5–10.5)

## 2014-03-05 MED ORDER — FLUOXETINE HCL 20 MG PO CAPS: ORAL_CAPSULE | ORAL | Status: DC

## 2014-03-05 MED ORDER — NEBIVOLOL HCL 10 MG PO TABS: ORAL_TABLET | ORAL | Status: DC

## 2014-03-05 MED ORDER — LORAZEPAM 1 MG PO TABS: 1.0000 mg | ORAL_TABLET | Freq: Four times a day (QID) | ORAL | Status: DC | PRN

## 2014-03-05 NOTE — Patient Instructions (Signed)
Viral Gastroenteritis Viral gastroenteritis is also known as stomach flu. This condition affects the stomach and intestinal tract. It can cause sudden diarrhea and vomiting. The illness typically lasts 3 to 8 days. Most people develop an immune response that eventually gets rid of the virus. While this natural response develops, the virus can make you quite ill. CAUSES  Many different viruses can cause gastroenteritis, such as rotavirus or noroviruses. You can catch one of these viruses by consuming contaminated food or water. You may also catch a virus by sharing utensils or other personal items with an infected person or by touching a contaminated surface. SYMPTOMS  The most common symptoms are diarrhea and vomiting. These problems can cause a severe loss of body fluids (dehydration) and a body salt (electrolyte) imbalance. Other symptoms may include:  Fever.  Headache.  Fatigue.  Abdominal pain. DIAGNOSIS  Your caregiver can usually diagnose viral gastroenteritis based on your symptoms and a physical exam. A stool sample may also be taken to test for the presence of viruses or other infections. TREATMENT  This illness typically goes away on its own. Treatments are aimed at rehydration. The most serious cases of viral gastroenteritis involve vomiting so severely that you are not able to keep fluids down. In these cases, fluids must be given through an intravenous line (IV). HOME CARE INSTRUCTIONS   Drink enough fluids to keep your urine clear or pale yellow. Drink small amounts of fluids frequently and increase the amounts as tolerated.  Ask your caregiver for specific rehydration instructions.  Avoid:  Foods high in sugar.  Alcohol.  Carbonated drinks.  Tobacco.  Juice.  Caffeine drinks.  Extremely hot or cold fluids.  Fatty, greasy foods.  Too much intake of anything at one time.  Dairy products until 24 to 48 hours after diarrhea stops.  You may consume probiotics.  Probiotics are active cultures of beneficial bacteria. They may lessen the amount and number of diarrheal stools in adults. Probiotics can be found in yogurt with active cultures and in supplements.  Wash your hands well to avoid spreading the virus.  Only take over-the-counter or prescription medicines for pain, discomfort, or fever as directed by your caregiver. Do not give aspirin to children. Antidiarrheal medicines are not recommended.  Ask your caregiver if you should continue to take your regular prescribed and over-the-counter medicines.  Keep all follow-up appointments as directed by your caregiver. SEEK IMMEDIATE MEDICAL CARE IF:   You are unable to keep fluids down.  You do not urinate at least once every 6 to 8 hours.  You develop shortness of breath.  You notice blood in your stool or vomit. This may look like coffee grounds.  You have abdominal pain that increases or is concentrated in one small area (localized).  You have persistent vomiting or diarrhea.  You have a fever.  The patient is a child younger than 3 months, and he or she has a fever.  The patient is a child older than 3 months, and he or she has a fever and persistent symptoms.  The patient is a child older than 3 months, and he or she has a fever and symptoms suddenly get worse.  The patient is a baby, and he or she has no tears when crying. MAKE SURE YOU:   Understand these instructions.  Will watch your condition.  Will get help right away if you are not doing well or get worse. Document Released: 12/14/2005 Document Revised: 03/07/2012 Document Reviewed: 09/30/2011  01/25/2012 °Exit[employer]® Patient Information ©2014 [employer]. ° °

## 2014-03-05 NOTE — Progress Notes (Signed)
Pre visit review using our clinic review tool, if applicable. No additional management support is needed unless otherwise documented below in the visit note. 

## 2014-03-05 NOTE — Progress Notes (Signed)
Subjective:    Patient ID: Hector [name], male    DOB: 1971/08/10, 43 y.o.   MRN: [id number]  HPI  43 year old white male, not, patient of Dr. Elease Hashimoto is in today with complaints of diarrhea x6 days and initially was improving and worsened yesterday. He had been eating a brats diet then had fast food and symptoms regressed. Reports having occasional abdominal pain that she rates 7/10. Described as dull pain. No blood in stool but stools are foul.   Review of Systems  Constitutional: Negative.   Respiratory: Negative.   Cardiovascular: Negative.   Genitourinary: Negative.   Musculoskeletal: Negative.   Skin: Negative.   Allergic/Immunologic: Negative.   Psychiatric/Behavioral: Negative.    Past Medical History  Diagnosis Date  . Hypertension   . Depression   . Anxiety   . Seizures     febrile  . Jaundice, newborn   . Blood in stool   . Panic disorder   . Allergy     History   Social History  . Marital Status: Married    Spouse Name: N/A    Number of Children: N/A  . Years of Education: N/A   Occupational History  . Not on file.   Social History Main Topics  . Smoking status: Never Smoker   . Smokeless tobacco: Not on file  . Alcohol Use: Not on file  . Drug Use: Not on file  . Sexual Activity: Not on file   Other Topics Concern  . Not on file   Social History Narrative  . No narrative on file    Past Surgical History  Procedure Laterality Date  . Wisdom tooth extraction  1991    Family History  Problem Relation Age of Onset  . Dementia Mother   . Stroke Mother   . Hypertension Mother   . Skin cancer Mother     basal and squamos  . Diabetes Father   . Hyperlipidemia Father   . Stroke Father   . Heart disease Father 58  . Colitis Brother     No Known Allergies  No current outpatient prescriptions on file prior to visit.   No current facility-administered medications on file prior to visit.    BP 110/80  Pulse 77  Temp(Src) 97.3 F  (36.3 C) (Oral)  Wt 259 lb (117.482 kg)chart    Objective:   Physical Exam  Constitutional: He is oriented to person, place, and time. He appears well-developed and well-nourished.  HENT:  Right Ear: External ear normal.  Left Ear: External ear normal.  Nose: Nose normal.  Mouth/Throat: Oropharynx is clear and moist.  Neck: Normal range of motion. Neck supple.  Cardiovascular: Normal rate, regular rhythm and normal heart sounds.   Pulmonary/Chest: Effort normal and breath sounds normal.  Abdominal: Soft. There is no tenderness. There is no rebound and no guarding.  Musculoskeletal: Normal range of motion.  Neurological: He is alert and oriented to person, place, and time.  Skin: Skin is warm and dry.  Psychiatric: He has a normal mood and affect.          Assessment & Plan:  Hector [name] was seen today for diarrhea, emesis and abdominal pain.  Diagnoses and associated orders for this visit:  Viral gastroenteritis - CBC with Differential - Clostridium difficile culture-fecal - CMP  Diarrhea  Nausea with vomiting  Anxiety state, unspecified  Other Orders - FLUoxetine (PROZAC) 20 MG capsule; TAKE 1 CAPSULE DAILY - nebivolol (BYSTOLIC) 10 MG tablet; TAKE 1  TABLET DAILY - LORazepam (ATIVAN) 1 MG tablet; Take 1 tablet (1 mg total) by mouth every 6 (six) hours as needed for anxiety.   Call the office with any questions or concerns. Recheck pending labs and sooner as needed.

## 2014-03-07 ENCOUNTER — Telehealth: Payer: Self-pay | Admitting: Family Medicine

## 2014-03-07 NOTE — Telephone Encounter (Signed)
Pt wife is calling requesting result  Of stool samples she dropped off yesterday.

## 2014-03-07 NOTE — Telephone Encounter (Signed)
Left message on VM, i have not received results

## 2014-03-07 NOTE — Telephone Encounter (Signed)
Wife is asking is stool sample results can be rushed. Message left on unidentified answering machine to call back.

## 2014-03-08 NOTE — Telephone Encounter (Signed)
Left message to advise pt's wife of note

## 2014-03-08 NOTE — Telephone Encounter (Signed)
A culture cannot be rushed, it is grown in a lab over time. We can send to GI asap.

## 2014-03-11 LAB — CLOSTRIDIUM DIFFICILE CULTURE-FECAL

## 2014-05-04 ENCOUNTER — Other Ambulatory Visit: Payer: Self-pay | Admitting: Family

## 2014-05-04 ENCOUNTER — Telehealth: Payer: Self-pay | Admitting: Family Medicine

## 2014-05-04 NOTE — Telephone Encounter (Signed)
Last visit 01/22/14 Last refill 03/05/14 #30 0 refill

## 2014-05-04 NOTE — Telephone Encounter (Signed)
Pt req rx LORazepam (ATIVAN) 1 MG tablet   Pt said he going out of town and would like  refill done today    Pharmacy ;[facility]  [address]

## 2014-05-04 NOTE — Telephone Encounter (Signed)
Refill once.  Should avoid regular use.

## 2014-05-07 MED ORDER — LORAZEPAM 1 MG PO TABS: 1.0000 mg | ORAL_TABLET | Freq: Four times a day (QID) | ORAL | Status: DC | PRN

## 2014-05-07 NOTE — Telephone Encounter (Signed)
Rx called in to pharmacy. 

## 2014-05-08 ENCOUNTER — Other Ambulatory Visit: Payer: Self-pay | Admitting: Family

## 2014-07-19 ENCOUNTER — Other Ambulatory Visit: Payer: Self-pay | Admitting: Family

## 2014-07-19 NOTE — Telephone Encounter (Signed)
Last visit 03/05/14 Last refill 05/07/14 #30 0 refill

## 2014-07-19 NOTE — Telephone Encounter (Signed)
Refill once.  Avoid regular use. 

## 2014-09-14 ENCOUNTER — Telehealth: Payer: Self-pay | Admitting: Family Medicine

## 2014-09-14 NOTE — Telephone Encounter (Signed)
Last visit 03/05/14 Last refill 07/20/14 #30 0 refill

## 2014-09-14 NOTE — Telephone Encounter (Signed)
[name] [address] requesting re-fill on LORazepam (ATIVAN) 1 MG tablet

## 2014-09-16 NOTE — Telephone Encounter (Signed)
Refill once.  Avoid regular use. 

## 2014-09-17 MED ORDER — LORAZEPAM 1 MG PO TABS: ORAL_TABLET | ORAL | Status: DC

## 2014-09-17 NOTE — Telephone Encounter (Signed)
RX called into pharmacy

## 2014-09-28 ENCOUNTER — Telehealth: Payer: Self-pay | Admitting: Family Medicine

## 2014-09-28 MED ORDER — FLUOXETINE HCL 20 MG PO CAPS: ORAL_CAPSULE | ORAL | Status: DC

## 2014-09-28 NOTE — Telephone Encounter (Signed)
Pt's rx will be delayed and he needs about 5 tabs  FLUoxetine (PROZAC) 20 MG capsule [facility] to [facility]

## 2014-09-28 NOTE — Telephone Encounter (Signed)
Rx sent to pharmacy   

## 2014-11-26 ENCOUNTER — Other Ambulatory Visit: Payer: Self-pay | Admitting: Family Medicine

## 2014-11-26 NOTE — Telephone Encounter (Signed)
Avoid regular use.  May refill #20 and use sparingly for severe breakthrough anxiety symptoms.

## 2014-11-26 NOTE — Telephone Encounter (Signed)
Last visit 03/05/14 Last refill 09/17/14 #30 0 refill

## 2014-12-31 ENCOUNTER — Other Ambulatory Visit: Payer: Self-pay | Admitting: Family

## 2015-01-04 ENCOUNTER — Telehealth: Payer: Self-pay | Admitting: Family Medicine

## 2015-01-04 MED ORDER — FLUOXETINE HCL 20 MG PO CAPS: 20.0000 mg | ORAL_CAPSULE | Freq: Every day | ORAL | Status: DC

## 2015-01-04 NOTE — Telephone Encounter (Signed)
Rx sent to pharmacy   

## 2015-01-04 NOTE — Telephone Encounter (Signed)
Pt said the following med will not ship until next week and is asking if 5 pills can be called in to    FLUoxetine (PROZAC) 20 MG capsule    Pharmacy [facility] [place]

## 2015-02-04 ENCOUNTER — Other Ambulatory Visit: Payer: Self-pay | Admitting: Family Medicine

## 2015-02-04 NOTE — Telephone Encounter (Signed)
Last visit 03/05/14 Saw [facility] Last refill 11/27/14 #20 0 refill

## 2015-02-04 NOTE — Telephone Encounter (Signed)
Should not be taking this regularly.  Refill once but use sparingly only for severe anxiety.

## 2015-02-05 NOTE — Telephone Encounter (Signed)
Rx faxed

## 2015-02-28 ENCOUNTER — Other Ambulatory Visit: Payer: Self-pay | Admitting: Family

## 2015-03-01 ENCOUNTER — Encounter: Payer: Self-pay | Admitting: Family Medicine

## 2015-03-01 ENCOUNTER — Ambulatory Visit (INDEPENDENT_AMBULATORY_CARE_PROVIDER_SITE_OTHER): Payer: BLUE CROSS/BLUE SHIELD | Admitting: Family Medicine

## 2015-03-01 ENCOUNTER — Ambulatory Visit: Payer: BLUE CROSS/BLUE SHIELD | Admitting: Family Medicine

## 2015-03-01 VITALS — BP 126/74 | HR 66 | Temp 97.5°F | Wt 283.0 lb

## 2015-03-01 DIAGNOSIS — R21 Rash and other nonspecific skin eruption: Secondary | ICD-10-CM

## 2015-03-01 DIAGNOSIS — F410 Panic disorder [episodic paroxysmal anxiety] without agoraphobia: Secondary | ICD-10-CM

## 2015-03-01 DIAGNOSIS — I10 Essential (primary) hypertension: Secondary | ICD-10-CM

## 2015-03-01 MED ORDER — FLUOXETINE HCL (PMDD) 10 MG PO CAPS: 1.0000 | ORAL_CAPSULE | Freq: Every day | ORAL | Status: DC

## 2015-03-01 MED ORDER — NEBIVOLOL HCL 10 MG PO TABS: ORAL_TABLET | ORAL | Status: DC

## 2015-03-01 NOTE — Progress Notes (Signed)
   Subjective:    Patient ID: Hector [name], male    DOB: January 08, 1971, 44 y.o.   MRN: [id number]  HPI Patient seen for medical follow-up. He has history of hypertension which is treated with bystolic. Blood pressure been controlled on this medication for several years. No side effects. He has had some weight gain during the past year. No regular exercise.  Intermittent skin rash lower legs. Dry skin. Has used cortisone creams in the past which helped.  History of anxiety. Possible panic disorder. Has been on Prozac for several years. No side effects. He has tried stopping this suddenly couple times the past but has had some anxiety symptoms within a couple days. We explained this is somewhat unusual because of the long half life of fluoxetine. He will like to consider tapering this back to 10 mg in possibly stopping in one month. Recent anxiety symptoms been stable.  Past Medical History  Diagnosis Date  . Hypertension   . Depression   . Anxiety   . Seizures     febrile  . Jaundice, newborn   . Blood in stool   . Panic disorder   . Allergy    Past Surgical History  Procedure Laterality Date  . Wisdom tooth extraction  1991    reports that he has never smoked. He does not have any smokeless tobacco history on file. His alcohol and drug histories are not on file. family history includes Colitis in his brother; Dementia in his mother; Diabetes in his father; Heart disease (age of onset: 32) in his father; Hyperlipidemia in his father; Hypertension in his mother; Skin cancer in his mother; Stroke in his father and mother. No Known Allergies    Review of Systems  Constitutional: Negative for fatigue.  Eyes: Negative for visual disturbance.  Respiratory: Negative for cough, chest tightness and shortness of breath.   Cardiovascular: Negative for chest pain, palpitations and leg swelling.  Neurological: Negative for dizziness, syncope, weakness, light-headedness and headaches.         Objective:   Physical Exam  Constitutional: He is oriented to person, place, and time. He appears well-developed and well-nourished.  HENT:  Right Ear: External ear normal.  Left Ear: External ear normal.  Mouth/Throat: Oropharynx is clear and moist.  Eyes: Pupils are equal, round, and reactive to light.  Neck: Neck supple. No thyromegaly present.  Cardiovascular: Normal rate and regular rhythm.  Exam reveals no gallop.   No murmur heard. Pulmonary/Chest: Effort normal and breath sounds normal. No respiratory distress. He has no wheezes. He has no rales.  Musculoskeletal: He exhibits no edema.  Neurological: He is alert and oriented to person, place, and time.  Skin:  Generally dry skin lower legs but no specific visible rash at this time  Psychiatric: He has a normal mood and affect. His behavior is normal.          Assessment & Plan:  #1 hypertension stable and at goal. Would recommend weight loss. Refill blood pressure medication for one year #2 history of chronic anxiety. Reported panic disorder. Patient is requesting taper off of fluoxetine. Fluoxetine 10 mg 1 daily and after one month consider discontinuing if no breakthrough symptoms. #3 recurrent skin rash lower leg. Suspect eczema. Continue steroid creams as needed

## 2015-03-01 NOTE — Patient Instructions (Signed)
Consider taking the Fluoxetine for one month at the 10 mg dose and then discontinue.

## 2015-03-01 NOTE — Progress Notes (Signed)
Pre visit review using our clinic review tool, if applicable. No additional management support is needed unless otherwise documented below in the visit note. 

## 2015-03-13 ENCOUNTER — Other Ambulatory Visit (INDEPENDENT_AMBULATORY_CARE_PROVIDER_SITE_OTHER): Payer: BLUE CROSS/BLUE SHIELD

## 2015-03-13 DIAGNOSIS — Z0000 Encounter for general adult medical examination without abnormal findings: Secondary | ICD-10-CM

## 2015-03-13 LAB — LIPID PANEL
Cholesterol: 195 mg/dL (ref 0–200)
HDL: 34.2 mg/dL — ABNORMAL LOW (ref >39.00)
LDL CALC: 133 mg/dL — AB (ref 0–99)
NonHDL: 160.8
Total CHOL/HDL Ratio: 6
Triglycerides: 138 mg/dL (ref 0.0–149.0)
VLDL: 27.6 mg/dL (ref 0.0–40.0)

## 2015-03-13 LAB — CBC WITH DIFFERENTIAL/PLATELET
BASOS ABS: 0 10*3/uL (ref 0.0–0.1)
Basophils Relative: 0.1 % (ref 0.0–3.0)
EOS ABS: 0.4 10*3/uL (ref 0.0–0.7)
Eosinophils Relative: 4.5 % (ref 0.0–5.0)
HCT: 45.3 % (ref 39.0–52.0)
Hemoglobin: 16.1 g/dL (ref 13.0–17.0)
LYMPHS PCT: 19.6 % (ref 12.0–46.0)
Lymphs Abs: 1.6 10*3/uL (ref 0.7–4.0)
MCHC: 35.4 g/dL (ref 30.0–36.0)
MCV: 86.7 fl (ref 78.0–100.0)
Monocytes Absolute: 0.6 10*3/uL (ref 0.1–1.0)
Monocytes Relative: 6.8 % (ref 3.0–12.0)
NEUTROS ABS: 5.6 10*3/uL (ref 1.4–7.7)
NEUTROS PCT: 69 % (ref 43.0–77.0)
PLATELETS: 167 10*3/uL (ref 150.0–400.0)
RBC: 5.23 Mil/uL (ref 4.22–5.81)
RDW: 12.6 % (ref 11.5–15.5)
WBC: 8.1 10*3/uL (ref 4.0–10.5)

## 2015-03-13 LAB — BASIC METABOLIC PANEL
BUN: 18 mg/dL (ref 6–23)
CALCIUM: 9.3 mg/dL (ref 8.4–10.5)
CO2: 28 mEq/L (ref 19–32)
Chloride: 104 mEq/L (ref 96–112)
Creatinine, Ser: 1.02 mg/dL (ref 0.40–1.50)
GFR: 84.56 mL/min (ref >60.00)
Glucose, Bld: 96 mg/dL (ref 70–99)
Potassium: 4.2 mEq/L (ref 3.5–5.1)
Sodium: 140 mEq/L (ref 135–145)

## 2015-03-13 LAB — HEPATIC FUNCTION PANEL
ALK PHOS: 86 U/L (ref 39–117)
ALT: 23 U/L (ref 0–53)
AST: 19 U/L (ref 0–37)
Albumin: 4.4 g/dL (ref 3.5–5.2)
BILIRUBIN DIRECT: 0.2 mg/dL (ref 0.0–0.3)
BILIRUBIN TOTAL: 0.9 mg/dL (ref 0.2–1.2)
TOTAL PROTEIN: 7.3 g/dL (ref 6.0–8.3)

## 2015-03-13 LAB — TSH: TSH: 1.78 u[IU]/mL (ref 0.35–4.50)

## 2015-03-18 ENCOUNTER — Encounter: Payer: Self-pay | Admitting: Family Medicine

## 2015-03-18 ENCOUNTER — Ambulatory Visit (INDEPENDENT_AMBULATORY_CARE_PROVIDER_SITE_OTHER): Payer: BLUE CROSS/BLUE SHIELD | Admitting: Family Medicine

## 2015-03-18 VITALS — BP 130/80 | HR 59 | Temp 97.7°F | Wt 274.0 lb

## 2015-03-18 DIAGNOSIS — Z0000 Encounter for general adult medical examination without abnormal findings: Secondary | ICD-10-CM

## 2015-03-18 NOTE — Progress Notes (Signed)
   Subjective:    Patient ID: Hector [name], male    DOB: March 15, 1971, 44 y.o.   MRN: [id number]  HPI Patient for complete physical. He has history of obesity, panic disorder, borderline hypertension, and GERD. He is currently tapering off fluoxetine and has had no breakthrough symptoms. He takes bystolic for elevated blood pressure He has not lost any weight during the past year. No exercise. Wife getting ready to have gastric bypass surgery and he plans to make some dramatic lifestyle changes with diet and exercise. Tetanus up-to-date.  Reviewed with no changes:  Past Medical History  Diagnosis Date  . Hypertension   . Depression   . Anxiety   . Seizures     febrile  . Jaundice, newborn   . Blood in stool   . Panic disorder   . Allergy    Past Surgical History  Procedure Laterality Date  . Wisdom tooth extraction  1991    reports that he has never smoked. He does not have any smokeless tobacco history on file. His alcohol and drug histories are not on file. family history includes Colitis in his brother; Dementia in his mother; Diabetes in his father; Heart disease (age of onset: 47) in his father; Hyperlipidemia in his father; Hypertension in his mother; Skin cancer in his mother; Stroke in his father and mother. No Known Allergies    Review of Systems  Constitutional: Negative for fever, activity change, appetite change and fatigue.  HENT: Negative for congestion, ear pain and trouble swallowing.   Eyes: Negative for pain and visual disturbance.  Respiratory: Negative for cough, shortness of breath and wheezing.   Cardiovascular: Negative for chest pain and palpitations.  Gastrointestinal: Negative for nausea, vomiting, abdominal pain, diarrhea, constipation, blood in stool, abdominal distention and rectal pain.  Genitourinary: Negative for dysuria, hematuria and testicular pain.  Musculoskeletal: Negative for joint swelling and arthralgias.  Skin: Negative for rash.    Neurological: Negative for dizziness, syncope and headaches.  Hematological: Negative for adenopathy.  Psychiatric/Behavioral: Negative for confusion and dysphoric mood.       Objective:   Physical Exam  Constitutional: He is oriented to person, place, and time. He appears well-developed and well-nourished. No distress.  HENT:  Head: Normocephalic and atraumatic.  Right Ear: External ear normal.  Left Ear: External ear normal.  Mouth/Throat: Oropharynx is clear and moist.  Eyes: Conjunctivae and EOM are normal. Pupils are equal, round, and reactive to light.  Neck: Normal range of motion. Neck supple. No thyromegaly present.  Cardiovascular: Normal rate, regular rhythm and normal heart sounds.   No murmur heard. Pulmonary/Chest: No respiratory distress. He has no wheezes. He has no rales.  Abdominal: Soft. Bowel sounds are normal. He exhibits no distension and no mass. There is no tenderness. There is no rebound and no guarding.  Musculoskeletal: He exhibits no edema.  Lymphadenopathy:    He has no cervical adenopathy.  Neurological: He is alert and oriented to person, place, and time. He displays normal reflexes. No cranial nerve deficit.  Skin: No rash noted.  Psychiatric: He has a normal mood and affect.          Assessment & Plan:  Complete physical. Labs reviewed. He has mild dyslipidemia with low HDL. Blood sugars normal. We have strongly advocated weight loss and establishing more consistent exercise. He has high risk for type 2 diabetes with positive family history.

## 2015-03-18 NOTE — Patient Instructions (Signed)
Try to lose some weight and establish more consistent exercise.

## 2015-03-18 NOTE — Progress Notes (Signed)
Pre visit review using our clinic review tool, if applicable. No additional management support is needed unless otherwise documented below in the visit note. 

## 2015-04-26 ENCOUNTER — Other Ambulatory Visit: Payer: Self-pay | Admitting: Family Medicine

## 2015-04-30 ENCOUNTER — Other Ambulatory Visit: Payer: Self-pay | Admitting: Family Medicine

## 2015-05-13 ENCOUNTER — Ambulatory Visit (INDEPENDENT_AMBULATORY_CARE_PROVIDER_SITE_OTHER): Payer: BLUE CROSS/BLUE SHIELD | Admitting: Family Medicine

## 2015-05-13 ENCOUNTER — Encounter: Payer: Self-pay | Admitting: Family Medicine

## 2015-05-13 VITALS — BP 126/76 | HR 86 | Temp 98.1°F | Wt 280.0 lb

## 2015-05-13 DIAGNOSIS — R600 Localized edema: Secondary | ICD-10-CM

## 2015-05-13 MED ORDER — LORAZEPAM 1 MG PO TABS: 1.0000 mg | ORAL_TABLET | Freq: Three times a day (TID) | ORAL | Status: DC | PRN

## 2015-05-13 MED ORDER — CEPHALEXIN 500 MG PO CAPS: 500.0000 mg | ORAL_CAPSULE | Freq: Three times a day (TID) | ORAL | Status: DC

## 2015-05-13 NOTE — Progress Notes (Signed)
   Subjective:    Patient ID: Hector [name], male    DOB: 02/11/1971, 44 y.o.   MRN: [id number]  HPI Patient seen with left lower extremity edema. He states that about 9 days ago he was up in [place] and noticed some redness and swelling went to an urgent care center. He was placed on doxycycline and after 2 days did not see any improvement. He had subsequent addition of Keflex and remains on both antibiotics at this time. He does state the redness is some improved but edema is not much better. Leg pain is slightly improved. Denies any calf pain. No thigh pain. He has a couple more days of Keflex and doxycycline remaining.  No adverse side effects from antibiotics. Denies any chest pains or pleuritic pain. No fevers or chills at this time  Past Medical History  Diagnosis Date  . Hypertension   . Depression   . Anxiety   . Seizures     febrile  . Jaundice, newborn   . Blood in stool   . Panic disorder   . Allergy    Past Surgical History  Procedure Laterality Date  . Wisdom tooth extraction  1991    reports that he has never smoked. He does not have any smokeless tobacco history on file. His alcohol and drug histories are not on file. family history includes Colitis in his brother; Dementia in his mother; Diabetes in his father; Heart disease (age of onset: 29) in his father; Hyperlipidemia in his father; Hypertension in his mother; Skin cancer in his mother; Stroke in his father and mother. No Known Allergies    Review of Systems  Constitutional: Negative for fever, chills and fatigue.  Respiratory: Negative for shortness of breath.   Cardiovascular: Positive for leg swelling. Negative for chest pain and palpitations.       Objective:   Physical Exam  Constitutional: He appears well-developed and well-nourished.  Neck: Neck supple. No JVD present.  Cardiovascular: Normal rate and regular rhythm.  Exam reveals no gallop.   No murmur heard. Pulmonary/Chest: Effort normal and  breath sounds normal. No respiratory distress. He has no wheezes. He has no rales.  Musculoskeletal: He exhibits edema.  Patient has obvious increase edema left lower extremity compared to right. No fever or warmth to touch. Good dorsalis pedis pulses bilaterally and good capillary refill. No left calf tenderness. He has some diffuse very mild erythema left lower extremity involving the leg. No increased warmth compared to the right          Assessment & Plan:  Left leg edema. Suspect this is predominantly resolving cellulitis. Check venous Doppler left lower extremity. Extend Keflex 500 mg 3 times a day for 7 more days. Frequent elevation. Follow-up for any fever or recurrence of erythema or worsening symptoms

## 2015-05-13 NOTE — Patient Instructions (Signed)

## 2015-05-13 NOTE — Progress Notes (Signed)
Pre visit review using our clinic review tool, if applicable. No additional management support is needed unless otherwise documented below in the visit note. 

## 2015-05-15 ENCOUNTER — Ambulatory Visit (HOSPITAL_COMMUNITY)
Admission: RE | Admit: 2015-05-15 | Discharge: 2015-05-15 | Disposition: A | Discharge status: Home / Self Care | Payer: BLUE CROSS/BLUE SHIELD | Source: Ambulatory Visit | Attending: Cardiology | Admitting: Cardiology

## 2015-05-15 DIAGNOSIS — R600 Localized edema: Secondary | ICD-10-CM

## 2015-05-24 ENCOUNTER — Telehealth (HOSPITAL_COMMUNITY): Payer: Self-pay | Admitting: *Deleted

## 2015-05-27 ENCOUNTER — Emergency Department (HOSPITAL_BASED_OUTPATIENT_CLINIC_OR_DEPARTMENT_OTHER)
Admit: 2015-05-27 | Discharge: 2015-05-27 | Disposition: A | Discharge status: Home / Self Care | Payer: BLUE CROSS/BLUE SHIELD | Attending: Emergency Medicine | Admitting: Emergency Medicine

## 2015-05-27 ENCOUNTER — Emergency Department (HOSPITAL_COMMUNITY)
Admission: EM | Admit: 2015-05-27 | Discharge: 2015-05-27 | Disposition: A | Discharge status: Home / Self Care | Payer: BLUE CROSS/BLUE SHIELD | Attending: Emergency Medicine | Admitting: Emergency Medicine

## 2015-05-27 ENCOUNTER — Encounter (HOSPITAL_COMMUNITY): Payer: Self-pay

## 2015-05-27 DIAGNOSIS — L97429 Non-pressure chronic ulcer of left heel and midfoot with unspecified severity: Secondary | ICD-10-CM | POA: Diagnosis not present

## 2015-05-27 DIAGNOSIS — F329 Major depressive disorder, single episode, unspecified: Secondary | ICD-10-CM | POA: Insufficient documentation

## 2015-05-27 DIAGNOSIS — Z8719 Personal history of other diseases of the digestive system: Secondary | ICD-10-CM | POA: Insufficient documentation

## 2015-05-27 DIAGNOSIS — I10 Essential (primary) hypertension: Secondary | ICD-10-CM | POA: Diagnosis not present

## 2015-05-27 DIAGNOSIS — Z79899 Other long term (current) drug therapy: Secondary | ICD-10-CM | POA: Diagnosis not present

## 2015-05-27 DIAGNOSIS — F410 Panic disorder [episodic paroxysmal anxiety] without agoraphobia: Secondary | ICD-10-CM | POA: Insufficient documentation

## 2015-05-27 DIAGNOSIS — M7989 Other specified soft tissue disorders: Secondary | ICD-10-CM

## 2015-05-27 DIAGNOSIS — I878 Other specified disorders of veins: Secondary | ICD-10-CM

## 2015-05-27 DIAGNOSIS — R2242 Localized swelling, mass and lump, left lower limb: Secondary | ICD-10-CM | POA: Diagnosis present

## 2015-05-27 LAB — CBC WITH DIFFERENTIAL/PLATELET
BASOS ABS: 0 10*3/uL (ref 0.0–0.1)
Basophils Relative: 0 % (ref 0–1)
EOS ABS: 0.2 10*3/uL (ref 0.0–0.7)
EOS PCT: 3 % (ref 0–5)
HCT: 39.3 % (ref 39.0–52.0)
Hemoglobin: 14 g/dL (ref 13.0–17.0)
LYMPHS ABS: 1.7 10*3/uL (ref 0.7–4.0)
Lymphocytes Relative: 27 % (ref 12–46)
MCH: 29.7 pg (ref 26.0–34.0)
MCHC: 35.6 g/dL (ref 30.0–36.0)
MCV: 83.3 fL (ref 78.0–100.0)
Monocytes Absolute: 0.3 10*3/uL (ref 0.1–1.0)
Monocytes Relative: 6 % (ref 3–12)
Neutro Abs: 4 10*3/uL (ref 1.7–7.7)
Neutrophils Relative %: 64 % (ref 43–77)
Platelets: 157 10*3/uL (ref 150–400)
RBC: 4.72 MIL/uL (ref 4.22–5.81)
RDW: 12 % (ref 11.5–15.5)
WBC: 6.2 10*3/uL (ref 4.0–10.5)

## 2015-05-27 LAB — COMPREHENSIVE METABOLIC PANEL
ALBUMIN: 3.7 g/dL (ref 3.5–5.0)
ALT: 17 U/L (ref 17–63)
AST: 19 U/L (ref 15–41)
Alkaline Phosphatase: 66 U/L (ref 38–126)
Anion gap: 8 (ref 5–15)
BILIRUBIN TOTAL: 0.9 mg/dL (ref 0.3–1.2)
BUN: 19 mg/dL (ref 6–20)
CHLORIDE: 109 mmol/L (ref 101–111)
CO2: 26 mmol/L (ref 22–32)
Calcium: 8.8 mg/dL — ABNORMAL LOW (ref 8.9–10.3)
Creatinine, Ser: 1.08 mg/dL (ref 0.61–1.24)
GFR calc Af Amer: >60 mL/min (ref >60)
Glucose, Bld: 105 mg/dL — ABNORMAL HIGH (ref 65–99)
Potassium: 3.9 mmol/L (ref 3.5–5.1)
SODIUM: 143 mmol/L (ref 135–145)
Total Protein: 6.7 g/dL (ref 6.5–8.1)

## 2015-05-27 NOTE — ED Provider Notes (Signed)
CSN: [id number]     Arrival date & time 05/27/15  1013 History   First MD Initiated Contact with Patient 05/27/15 1034     Chief Complaint  Patient presents with  . Cellulitis     (Consider location/radiation/quality/duration/timing/severity/associated sxs/prior Treatment) The history is provided by the patient. No language interpreter was used.  Hector [name] is a 44 year old male with past history of hypertension, depression, anxiety, jaundice, seizures, panic disorder presenting to the ED with cellulitis infection to the left lower extremity does been ongoing for approximately 3 weeks. Patient reported that when he was visiting [place] approximately 2 weeks ago he was started on 2 and biotics-doxycycline and Keflex that was taken for about 10 days. Patient reported that one standby [place] completed, the cellulitis still remained, reported that he was started on another week of Keflex that he finished proximal a 2-3 days ago. Patient reported that he also received a Doppler of his left lower extremity week ago that was negative for DVT. Patient reported that he continues to have redness, warmth to touch, swelling to the left lower extremity with increased pain. Stated that he called his primary care provider who recommended patient to come into the ED to be assessed in for IV antibiotics. Denied fever, nausea, vomiting, diarrhea, chest pain, shortness, difficulty breathing, weeping, drainage, bleeding, insect bites, numbness, tingling, loss of sensation, weakness. PCP Dr. Elease Hashimoto  Past Medical History  Diagnosis Date  . Hypertension   . Depression   . Anxiety   . Seizures     febrile  . Jaundice, newborn   . Blood in stool   . Panic disorder   . Allergy    Past Surgical History  Procedure Laterality Date  . Wisdom tooth extraction  1991   Family History  Problem Relation Age of Onset  . Dementia Mother   . Stroke Mother   . Hypertension Mother   . Skin cancer Mother     basal  and squamos  . Diabetes Father   . Hyperlipidemia Father   . Stroke Father   . Heart disease Father 66  . Colitis Brother    History  Substance Use Topics  . Smoking status: Never Smoker   . Smokeless tobacco: Not on file  . Alcohol Use: Not on file    Review of Systems  Constitutional: Negative for fever and chills.  Respiratory: Negative for shortness of breath.   Cardiovascular: Negative for chest pain.  Gastrointestinal: Negative for nausea and vomiting.  Skin: Positive for color change.  Neurological: Negative for weakness and numbness.      Allergies  Review of patient's allergies indicates no known allergies.  Home Medications   Prior to Admission medications   Medication Sig Start Date End Date Taking? Authorizing Provider  FLUoxetine (PROZAC) 10 MG capsule Take 10 mg by mouth daily.  03/01/15  Yes Historical Provider, MD  LORazepam (ATIVAN) 1 MG tablet Take 1 tablet (1 mg total) by mouth every 8 (eight) hours as needed. 05/13/15  Yes [name], MD  nebivolol (BYSTOLIC) 10 MG tablet TAKE 1 TABLET DAILY 03/01/15  Yes [name], MD   BP 118/64 mmHg  Pulse 64  Temp(Src) 97.8 F (36.6 C) (Oral)  Resp 16  Ht 5' 9"  (1.753 m)  Wt 277 lb (125.646 kg)  BMI 40.89 kg/m2  SpO2 99% Physical Exam  Constitutional: He is oriented to person, place, and time. He appears well-developed and well-nourished. No distress.  HENT:  Head: Normocephalic  and atraumatic.  Eyes: Conjunctivae and EOM are normal. Right eye exhibits no discharge. Left eye exhibits no discharge.  Neck: Normal range of motion. Neck supple.  Cardiovascular: Normal rate, regular rhythm and normal heart sounds.  Exam reveals no friction rub.   No murmur heard. Pulses:      Radial pulses are 2+ on the right side, and 2+ on the left side.       Dorsalis pedis pulses are 2+ on the right side, and 2+ on the left side.  Cap refill < 3 seconds  Pulmonary/Chest: Effort normal and breath sounds normal.  No respiratory distress. He has no wheezes. He has no rales.  Musculoskeletal: Normal range of motion.  Neurological: He is alert and oriented to person, place, and time. No cranial nerve deficit. He exhibits normal muscle tone. Coordination normal.  Skin: Skin is warm. He is not diaphoretic. There is erythema.  Swelling identified to left lower extremity with erythema identified to the dorsal aspect of the left foot and circumferentially around the left ankle. Warmth upon palpation and tenderness upon palpation. Negative ulcerations. Negative active drainage or bleeding, negative weeping.  Psychiatric: He has a normal mood and affect. His behavior is normal. Thought content normal.  Nursing note and vitals reviewed.   ED Course  Procedures (including critical care time)  Results for orders placed or performed during the hospital encounter of 05/27/15  CBC with Differential/Platelet  Result Value Ref Range   WBC 6.2 4.0 - 10.5 K/uL   RBC 4.72 4.22 - 5.81 MIL/uL   Hemoglobin 14.0 13.0 - 17.0 g/dL   HCT 39.3 39.0 - 52.0 %   MCV 83.3 78.0 - 100.0 fL   MCH 29.7 26.0 - 34.0 pg   MCHC 35.6 30.0 - 36.0 g/dL   RDW 12.0 11.5 - 15.5 %   Platelets 157 150 - 400 K/uL   Neutrophils Relative % 64 43 - 77 %   Neutro Abs 4.0 1.7 - 7.7 K/uL   Lymphocytes Relative 27 12 - 46 %   Lymphs Abs 1.7 0.7 - 4.0 K/uL   Monocytes Relative 6 3 - 12 %   Monocytes Absolute 0.3 0.1 - 1.0 K/uL   Eosinophils Relative 3 0 - 5 %   Eosinophils Absolute 0.2 0.0 - 0.7 K/uL   Basophils Relative 0 0 - 1 %   Basophils Absolute 0.0 0.0 - 0.1 K/uL  Comprehensive metabolic panel  Result Value Ref Range   Sodium 143 135 - 145 mmol/L   Potassium 3.9 3.5 - 5.1 mmol/L   Chloride 109 101 - 111 mmol/L   CO2 26 22 - 32 mmol/L   Glucose, Bld 105 (H) 65 - 99 mg/dL   BUN 19 6 - 20 mg/dL   Creatinine, Ser 1.08 0.61 - 1.24 mg/dL   Calcium 8.8 (L) 8.9 - 10.3 mg/dL   Total Protein 6.7 6.5 - 8.1 g/dL   Albumin 3.7 3.5 - 5.0 g/dL    AST 19 15 - 41 U/L   ALT 17 17 - 63 U/L   Alkaline Phosphatase 66 38 - 126 U/L   Total Bilirubin 0.9 0.3 - 1.2 mg/dL   GFR calc non Af Amer >60 >60 mL/min   GFR calc Af Amer >60 >60 mL/min   Anion gap 8 5 - 15    Labs Review Labs Reviewed  COMPREHENSIVE METABOLIC PANEL - Abnormal; Notable for the following:    Glucose, Bld 105 (*)    Calcium 8.8 (*)  All other components within normal limits  CBC WITH DIFFERENTIAL/PLATELET    Imaging Review No results found.   EKG Interpretation None       VASCULAR LAB PRELIMINARY PRELIMINARY PRELIMINARY PRELIMINARY  Left lower extremity venous Doppler completed.   Preliminary report: There is no DVT or SVT noted in the left lower extremity.  KANADY, CANDACE, RVT 05/27/2015, 11:46 AM  11:22 AM Dr. Eulis Foster, attending physician, at bedside assessing patient.  MDM   Final diagnoses:  Venous stasis  Left leg swelling    Medications - No data to display  Filed Vitals:   05/27/15 1230 05/27/15 1315 05/27/15 1400 05/27/15 1431  BP: 115/59 112/61 109/55 118/64  Pulse: 56 58 55 64  Temp:      TempSrc:      Resp:    16  Height:      Weight:      SpO2: 96% 97% 95% 99%   Patient presenting to the ED with cellulitic infection to the left lower extremity does been ongoing for approximately 3 weeks. Patient reports that he's been using into by a treatment for 2 weeks-first week of doxycycline and Keflex followed by another week of Keflex. Patient reports he continues to have pain, swelling, warmth upon palpation. CBC negative elevated leukocytosis. Hemoglobin 14.0, hematocrit 39.3. CMP unremarkable. Left lower extremity venous Doppler negative for DVT or SVT. Negative findings of DVT or SVT. Negative elevated leukocytosis. Patient is been stable while in ED setting, afebrile. Patient seen and assessed in the ED setting by attending physician, reported not to be cellulitis, but more so venous stasis possibility. Patient does not  meet SIRs criteria or septic criteria at this time. Patient stable, afebrile. Patient not septic appearing. Discharged patient. Discussed with patient to rest and stay hydrated. Discussed with patient to elevate leg - toes above nose at all times. Discussed with patient to avoid any physical or strenuous activity. Discussed with patient to closely monitor symptoms and if symptoms are to worsen or change to report back to the ED - strict return instructions given.  Patient agreed to plan of care, understood, all questions answered.   Jamse Mead, PA-C 05/27/15 [place], MD 05/27/15 [id number]

## 2015-05-27 NOTE — Progress Notes (Signed)
VASCULAR LAB PRELIMINARY  PRELIMINARY  PRELIMINARY  PRELIMINARY  Left lower extremity venous Doppler completed.    Preliminary report:  There is no DVT or SVT noted in the left lower extremity.  Anahis Furgeson, RVT 05/27/2015, 11:46 AM

## 2015-05-27 NOTE — ED Provider Notes (Signed)
  Face-to-face evaluation   History: He is here for evaluation of left lower leg swelling and redness. The swelling has been persistent for 3 or 4 weeks. No known trauma. Redness is less than previous, when he was diagnosed with cellulitis and treated for about 3 weeks ago. He's been on multiple courses of antibodies, for cellulitis of the left lower leg. No preceding similar symptoms.  Physical exam: Alert, calm, cooperative. Left lower leg, tender with edema, swelling, and trace erythema of the dorsum of the foot and anterior tibia. There are no areas of frank cellulitis. There is no proximal streaking.  15:40- discussed findings with the patient, Doppler negative for DVT. No firm clinical evidence for cellulitis.  Medical screening examination/treatment/procedure(s) were conducted as a shared visit with non-physician practitioner(s) and myself.  I personally evaluated the patient during the encounter  Daleen Bo, MD 05/27/15 [phone]

## 2015-05-27 NOTE — Discharge Instructions (Signed)
Please call your doctor for a followup appointment within 24-48 hours. When you talk to your doctor please let them know that you were seen in the emergency department and have them acquire all of your records so that they can discuss the findings with you and formulate a treatment plan to fully care for your new and ongoing problems. Please follow-up with your primary care provider early this week Please rest and elevate-toes above nose Please avoid any physical or strenuous activity  Please continue to monitor symptoms closely and if symptoms are to worsen or change (fever greater than 101, chills, sweating, nausea, vomiting, chest pain, shortness of breathe, difficulty breathing, weakness, numbness, tingling, worsening or changes to pain pattern, red streaks, increased swelling, hot to the touch, weeping, pus drainage, loss of sensation, fall, injury, groin swelling) please report back to the Emergency Department immediately.   Venous Stasis or Chronic Venous Insufficiency Chronic venous insufficiency, also called venous stasis, is a condition that affects the veins in the legs. The condition prevents blood from being pumped through these veins effectively. Blood may no longer be pumped effectively from the legs back to the heart. This condition can range from mild to severe. With proper treatment, you should be able to continue with an active life. CAUSES  Chronic venous insufficiency occurs when the vein walls become stretched, weakened, or damaged or when valves within the vein are damaged. Some common causes of this include:  High blood pressure inside the veins (venous hypertension).  Increased blood pressure in the leg veins from long periods of sitting or standing.  A blood clot that blocks blood flow in a vein (deep vein thrombosis).  Inflammation of a superficial vein (phlebitis) that causes a blood clot to form. RISK FACTORS Various things can make you more likely to develop chronic  venous insufficiency, including:  Family history of this condition.  Obesity.  Pregnancy.  Sedentary lifestyle.  Smoking.  Jobs requiring long periods of standing or sitting in one place.  Being a certain age. Women in their 63s and 29s and men in their 73s are more likely to develop this condition. SIGNS AND SYMPTOMS  Symptoms may include:   Varicose veins.  Skin breakdown or ulcers.  Reddened or discolored skin on the leg.  Brown, smooth, tight, and painful skin just above the ankle, usually on the inside surface (lipodermatosclerosis).  Swelling. DIAGNOSIS  To diagnose this condition, your health care provider will take a medical history and do a physical exam. The following tests may be ordered to confirm the diagnosis:  Duplex ultrasound--A procedure that produces a picture of a blood vessel and nearby organs and also provides information on blood flow through the blood vessel.  Plethysmography--A procedure that tests blood flow.  A venogram, or venography--A procedure used to look at the veins using X-ray and dye. TREATMENT The goals of treatment are to help you return to an active life and to minimize pain or disability. Treatment will depend on the severity of the condition. Medical procedures may be needed for severe cases. Treatment options may include:   Use of compression stockings. These can help with symptoms and lower the chances of the problem getting worse, but they do not cure the problem.  Sclerotherapy--A procedure involving an injection of a material that "dissolves" the damaged veins. Other veins in the network of blood vessels take over the function of the damaged veins.  Surgery to remove the vein or cut off blood flow through the vein (  vein stripping or laser ablation surgery).  Surgery to repair a valve. HOME CARE INSTRUCTIONS   Wear compression stockings as directed by your health care provider.  Only take over-the-counter or prescription  medicines for pain, discomfort, or fever as directed by your health care provider.  Follow up with your health care provider as directed. SEEK MEDICAL CARE IF:   You have redness, swelling, or increasing pain in the affected area.  You see a red streak or line that extends up or down from the affected area.  You have a breakdown or loss of skin in the affected area, even if the breakdown is small.  You have an injury to the affected area. SEEK IMMEDIATE MEDICAL CARE IF:   You have an injury and open wound in the affected area.  Your pain is severe and does not improve with medicine.  You have sudden numbness or weakness in the foot or ankle below the affected area, or you have trouble moving your foot or ankle.  You have a fever or persistent symptoms for more than 2-3 days.  You have a fever and your symptoms suddenly get worse. MAKE SURE YOU:   Understand these instructions.  Will watch your condition.  Will get help right away if you are not doing well or get worse. Document Released: 04/19/2007 Document Revised: 10/04/2013 Document Reviewed: 08/21/2013 [facility] [employer] Patient Information 2015 [place], [place]. This information is not intended to replace advice given to you by your health care provider. Make sure you discuss any questions you have with your health care provider.

## 2015-05-27 NOTE — ED Notes (Signed)
Pt reports he has confirmed cellulitis to the left lower leg and has been on antibiotics for three weeks.  Pt recently completed all antibiotic treatment and reports that last night his left lower leg and foot began to swell and become erythemas.  Pt spoke with his PMD who suggested that he come for IV antibiotics.

## 2015-05-28 ENCOUNTER — Telehealth: Payer: Self-pay

## 2015-05-28 ENCOUNTER — Encounter: Payer: Self-pay | Admitting: Family Medicine

## 2015-05-28 NOTE — Telephone Encounter (Signed)
PLEASE NOTE: All timestamps contained within this report are represented as [place] Standard Time. CONFIDENTIALTY NOTICE: This fax transmission is intended only for the addressee. It contains information that is legally privileged, confidential or otherwise protected from use or disclosure. If you are not the intended recipient, you are strictly prohibited from reviewing, disclosing, copying using or disseminating any of this information or taking any action in reliance on or regarding this information. If you have received this fax in error, please notify us immediately by telephone so that we can arrange for its return to [place]. Phone: [phone], Toll-Free: [phone], Fax: [phone] Page: 1 of 2 Call Id: [id number] [facility] Night - Client [place] Patient Name: Hector [name] Gender: Male DOB: 11-18-1971 Age: 44 Y 26 M 12 D Return Phone Number: [phone] (Primary) Address: [address] City/State/Zip: [place] [address] Client [facility] Night - Client Client Site [facility] - Night Physician Carolann Littler Contact Type Call Call Type Triage / [place] Name [facility] Relationship To Patient Spouse Return Phone Number [phone] (Primary) Chief Complaint Leg Pain Initial Comment Caller states her husband has had cellulitis for 3 weeks. The pt woke this morning and the left leg was red and tender and hurting again. The pt had been on antibiotics for 3 weeks. PreDisposition Go to Urgent Care/Walk-In Clinic Nurse Assessment Nurse: Denyse Amass, RN, Benjamine Mola Date/Time Eilene Ghazi Time): 05/26/2015 4:59:47 PM Confirm and document reason for call. If symptomatic, describe symptoms. ---Patient states he has been on an antibiotic for 3 weeks for a Cellulitis in his left lower leg. States his leg and foot are red, sore, hot again. He took Doxycycline and  Keflex both for 10 days. Then took Keflex again for another week. Has the patient traveled out of the country within the last 30 days? ---Not Applicable Does the patient require triage? ---Yes Related visit to physician within the last 2 weeks? ---Yes Does the PT have any chronic conditions? (i.e. diabetes, asthma, etc.) ---Yes List chronic conditions. ---HTN Depression Panic Disprder Guidelines Guideline Title Affirmed Question Affirmed Notes Nurse Date/Time (Eastern Time) Cellulitis on Antibiotic Follow-up Call [1] Taking antibiotic > 24 hours AND [2] cellulitis symptoms are WORSE (e.g., spreading redness, pain, swelling) [place], RN, Benjamine Mola 05/26/2015 5:15:09 PM Disp. Time Eilene Ghazi Time) Disposition Final User 05/26/2015 5:16:57 PM See Physician within 24 Hours Yes [place], RN, Lorin Glass Understands: Yes PLEASE NOTE: All timestamps contained within this report are represented as [place] Standard Time. CONFIDENTIALTY NOTICE: This fax transmission is intended only for the addressee. It contains information that is legally privileged, confidential or otherwise protected from use or disclosure. If you are not the intended recipient, you are strictly prohibited from reviewing, disclosing, copying using or disseminating any of this information or taking any action in reliance on or regarding this information. If you have received this fax in error, please notify us immediately by telephone so that we can arrange for its return to [place]. Phone: [phone], Toll-Free: [phone], Fax: [phone] Page: 2 of 2 Call Id: [id number] Disagree/Comply: Comply Care Advice Given Per Guideline SEE PHYSICIAN WITHIN 24 HOURS: * IF OFFICE WILL BE OPEN: You need to be examined within the next 24 hours. Call your doctor when the office opens, and make an appointment. * IF OFFICE WILL BE CLOSED AND NO PCP TRIAGE: You need to be examined within the next 24 hours. Go to _________ at your  convenience. PAIN MEDICINES: * For  pain relief, take acetaminophen, ibuprofen, or naproxen. * Use the lowest amount that makes your pain feel better. ACETAMINOPHEN (E.G., TYLENOL): * Take 650 mg (two 325 mg pills) by mouth every 4-6 hours as needed. Each Regular Strength Tylenol pill has 325 mg of acetaminophen. The most you should take each day is 3,250 mg (10 Regular Strength pills a day). * Another choice is to take 1,000 mg (two 500 mg pills) every 8 hours as needed. Each Extra Strength Tylenol pill has 500 mg of acetaminophen. The most you should take each day is 3,000 mg (6 Extra Strength pills a day). CALL BACK IF: * Fever over 100.5 F (38.1 C) * You become worse. After Care Instructions Given Call Event Type User Date / Time Description Referrals [facility] - ED

## 2015-05-28 NOTE — Telephone Encounter (Signed)
Seen in ER 05/27/15

## 2015-05-29 ENCOUNTER — Other Ambulatory Visit: Payer: Self-pay | Admitting: Family Medicine

## 2015-05-29 DIAGNOSIS — R600 Localized edema: Secondary | ICD-10-CM

## 2015-05-31 ENCOUNTER — Other Ambulatory Visit: Payer: Self-pay | Admitting: *Deleted

## 2015-05-31 MED ORDER — NEBIVOLOL HCL 10 MG PO TABS: ORAL_TABLET | ORAL | Status: DC

## 2015-06-07 ENCOUNTER — Other Ambulatory Visit: Payer: Self-pay

## 2015-06-07 DIAGNOSIS — R609 Edema, unspecified: Secondary | ICD-10-CM

## 2015-07-12 ENCOUNTER — Encounter: Payer: Self-pay | Admitting: Vascular Surgery

## 2015-07-16 ENCOUNTER — Encounter: Payer: BLUE CROSS/BLUE SHIELD | Admitting: Vascular Surgery

## 2015-07-16 ENCOUNTER — Encounter (HOSPITAL_COMMUNITY): Payer: BLUE CROSS/BLUE SHIELD

## 2015-07-22 ENCOUNTER — Other Ambulatory Visit: Payer: Self-pay | Admitting: Family Medicine

## 2015-07-22 NOTE — Telephone Encounter (Signed)
Last visit 05/13/15 Last refill 05/13/15 #20 0 refill

## 2015-07-23 NOTE — Telephone Encounter (Signed)
Refill once.  Avoid regular use. Use only pre-flying or for severe Panic symptoms.  If he is having frequent breakthrough anxiety symptoms, we should consider increasing his Fluoxetine

## 2015-08-23 ENCOUNTER — Encounter: Payer: Self-pay | Admitting: Family Medicine

## 2015-08-23 ENCOUNTER — Ambulatory Visit (INDEPENDENT_AMBULATORY_CARE_PROVIDER_SITE_OTHER): Payer: BLUE CROSS/BLUE SHIELD | Admitting: Family Medicine

## 2015-08-23 VITALS — BP 126/80 | HR 66 | Temp 97.5°F | Wt 280.0 lb

## 2015-08-23 DIAGNOSIS — S028XXA Fractures of other specified skull and facial bones, initial encounter for closed fracture: Secondary | ICD-10-CM | POA: Diagnosis not present

## 2015-08-23 DIAGNOSIS — S060X9A Concussion with loss of consciousness of unspecified duration, initial encounter: Secondary | ICD-10-CM | POA: Diagnosis not present

## 2015-08-23 DIAGNOSIS — M546 Pain in thoracic spine: Secondary | ICD-10-CM | POA: Diagnosis not present

## 2015-08-23 DIAGNOSIS — S0285XA Fracture of orbit, unspecified, initial encounter for closed fracture: Secondary | ICD-10-CM

## 2015-08-23 MED ORDER — NEBIVOLOL HCL 10 MG PO TABS: ORAL_TABLET | ORAL | Status: DC

## 2015-08-23 MED ORDER — AMOXICILLIN 875 MG PO TABS: 875.0000 mg | ORAL_TABLET | Freq: Two times a day (BID) | ORAL | Status: DC

## 2015-08-23 MED ORDER — FLUOXETINE HCL 10 MG PO CAPS: 10.0000 mg | ORAL_CAPSULE | Freq: Every day | ORAL | Status: DC

## 2015-08-23 MED ORDER — LORAZEPAM 1 MG PO TABS: 1.0000 mg | ORAL_TABLET | Freq: Three times a day (TID) | ORAL | Status: DC | PRN

## 2015-08-23 NOTE — Patient Instructions (Signed)
Concussion A concussion, or closed-head injury, is a brain injury caused by a direct blow to the head or by a quick and sudden movement (jolt) of the head or neck. Concussions are usually not life-threatening. Even so, the effects of a concussion can be serious. If you have had a concussion before, you are more likely to experience concussion-like symptoms after a direct blow to the head.  CAUSES  Direct blow to the head, such as from running into another player during a soccer game, being hit in a fight, or hitting your head on a hard surface.  A jolt of the head or neck that causes the brain to move back and forth inside the skull, such as in a car crash. SIGNS AND SYMPTOMS The signs of a concussion can be hard to notice. Early on, they may be missed by you, family members, and health care providers. You may look fine but act or feel differently. Symptoms are usually temporary, but they may last for days, weeks, or even longer. Some symptoms may appear right away while others may not show up for hours or days. Every head injury is different. Symptoms include:  Mild to moderate headaches that will not go away.  A feeling of pressure inside your head.  Having more trouble than usual:  Learning or remembering things you have heard.  Answering questions.  Paying attention or concentrating.  Organizing daily tasks.  Making decisions and solving problems.  Slowness in thinking, acting or reacting, speaking, or reading.  Getting lost or being easily confused.  Feeling tired all the time or lacking energy (fatigued).  Feeling drowsy.  Sleep disturbances.  Sleeping more than usual.  Sleeping less than usual.  Trouble falling asleep.  Trouble sleeping (insomnia).  Loss of balance or feeling lightheaded or dizzy.  Nausea or vomiting.  Numbness or tingling.  Increased sensitivity to:  Sounds.  Lights.  Distractions.  Vision problems or eyes that tire  easily.  Diminished sense of taste or smell.  Ringing in the ears.  Mood changes such as feeling sad or anxious.  Becoming easily irritated or angry for little or no reason.  Lack of motivation.  Seeing or hearing things other people do not see or hear (hallucinations). DIAGNOSIS Your health care provider can usually diagnose a concussion based on a description of your injury and symptoms. He or she will ask whether you passed out (lost consciousness) and whether you are having trouble remembering events that happened right before and during your injury. Your evaluation might include:  A brain scan to look for signs of injury to the brain. Even if the test shows no injury, you may still have a concussion.  Blood tests to be sure other problems are not present. TREATMENT  Concussions are usually treated in an emergency department, in urgent care, or at a clinic. You may need to stay in the hospital overnight for further treatment.  Tell your health care provider if you are taking any medicines, including prescription medicines, over-the-counter medicines, and natural remedies. Some medicines, such as blood thinners (anticoagulants) and aspirin, may increase the chance of complications. Also tell your health care provider whether you have had alcohol or are taking illegal drugs. This information may affect treatment.  Your health care provider will send you home with important instructions to follow.  How fast you will recover from a concussion depends on many factors. These factors include how severe your concussion is, what part of your brain was injured, your  age, and how healthy you were before the concussion.  Most people with mild injuries recover fully. Recovery can take time. In general, recovery is slower in older persons. Also, persons who have had a concussion in the past or have other medical problems may find that it takes longer to recover from their current injury. HOME  CARE INSTRUCTIONS General Instructions  Carefully follow the directions your health care provider gave you.  Only take over-the-counter or prescription medicines for pain, discomfort, or fever as directed by your health care provider.  Take only those medicines that your health care provider has approved.  Do not drink alcohol until your health care provider says you are well enough to do so. Alcohol and certain other drugs may slow your recovery and can put you at risk of further injury.  If it is harder than usual to remember things, write them down.  If you are easily distracted, try to do one thing at a time. For example, do not try to watch TV while fixing dinner.  Talk with family members or close friends when making important decisions.  Keep all follow-up appointments. Repeated evaluation of your symptoms is recommended for your recovery.  Watch your symptoms and tell others to do the same. Complications sometimes occur after a concussion. Older adults with a brain injury may have a higher risk of serious complications, such as a blood clot on the brain.  Tell your teachers, school nurse, school counselor, coach, athletic trainer, or work Freight forwarder about your injury, symptoms, and restrictions. Tell them about what you can or cannot do. They should watch for:  Increased problems with attention or concentration.  Increased difficulty remembering or learning new information.  Increased time needed to complete tasks or assignments.  Increased irritability or decreased ability to cope with stress.  Increased symptoms.  Rest. Rest helps the brain to heal. Make sure you:  Get plenty of sleep at night. Avoid staying up late at night.  Keep the same bedtime hours on weekends and weekdays.  Rest during the day. Take daytime naps or rest breaks when you feel tired.  Limit activities that require a lot of thought or concentration. These include:  Doing homework or job-related  work.  Watching TV.  Working on the computer.  Avoid any situation where there is potential for another head injury (football, hockey, soccer, basketball, martial arts, downhill snow sports and horseback riding). Your condition will get worse every time you experience a concussion. You should avoid these activities until you are evaluated by the appropriate follow-up health care providers. Returning To Your Regular Activities You will need to return to your normal activities slowly, not all at once. You must give your body and brain enough time for recovery.  Do not return to sports or other athletic activities until your health care provider tells you it is safe to do so.  Ask your health care provider when you can drive, ride a bicycle, or operate heavy machinery. Your ability to react may be slower after a brain injury. Never do these activities if you are dizzy.  Ask your health care provider about when you can return to work or school. Preventing Another Concussion It is very important to avoid another brain injury, especially before you have recovered. In rare cases, another injury can lead to permanent brain damage, brain swelling, or death. The risk of this is greatest during the first 7-10 days after a head injury. Avoid injuries by:  Wearing a seat  belt when riding in a car.  Drinking alcohol only in moderation.  Wearing a helmet when biking, skiing, skateboarding, skating, or doing similar activities.  Avoiding activities that could lead to a second concussion, such as contact or recreational sports, until your health care provider says it is okay.  Taking safety measures in your home.  Remove clutter and tripping hazards from floors and stairways.  Use grab bars in bathrooms and handrails by stairs.  Place non-slip mats on floors and in bathtubs.  Improve lighting in dim areas. SEEK MEDICAL CARE IF:  You have increased problems paying attention or  concentrating.  You have increased difficulty remembering or learning new information.  You need more time to complete tasks or assignments than before.  You have increased irritability or decreased ability to cope with stress.  You have more symptoms than before. Seek medical care if you have any of the following symptoms for more than 2 weeks after your injury:  Lasting (chronic) headaches.  Dizziness or balance problems.  Nausea.  Vision problems.  Increased sensitivity to noise or light.  Depression or mood swings.  Anxiety or irritability.  Memory problems.  Difficulty concentrating or paying attention.  Sleep problems.  Feeling tired all the time. SEEK IMMEDIATE MEDICAL CARE IF:  You have severe or worsening headaches. These may be a sign of a blood clot in the brain.  You have weakness (even if only in one hand, leg, or part of the face).  You have numbness.  You have decreased coordination.  You vomit repeatedly.  You have increased sleepiness.  One pupil is larger than the other.  You have convulsions.  You have slurred speech.  You have increased confusion. This may be a sign of a blood clot in the brain.  You have increased restlessness, agitation, or irritability.  You are unable to recognize people or places.  You have neck pain.  It is difficult to wake you up.  You have unusual behavior changes.  You lose consciousness. MAKE SURE YOU:  Understand these instructions.  Will watch your condition.  Will get help right away if you are not doing well or get worse. Document Released: 03/05/2004 Document Revised: 12/19/2013 Document Reviewed: 07/06/2013 [facility] Patient Information 2015 [place], [place]. This information is not intended to replace advice given to you by your health care provider. Make sure you discuss any questions you have with your health care provider.  Go ahead and start the antibiotics Follow up promptly for any  fever or increased redness and swelling around the left eye.

## 2015-08-23 NOTE — Progress Notes (Signed)
Pre visit review using our clinic review tool, if applicable. No additional management support is needed unless otherwise documented below in the visit note. 

## 2015-08-23 NOTE — Progress Notes (Signed)
Subjective:    Patient ID: Hector [name], male    DOB: 12/14/1971, 43 y.o.   MRN: [id number]  HPI Patient seen following concussion. This occurred yesterday. He was at a concert venue and was staying with a couple friends and had stepped out of the RV and either tripped and fell or possible syncope though he leans more towards being a trip episode. In any event, he has amnesia and no recollection of the fall or immediate events afterwards. He does recall that he was feeling very hot and had been out in the sun good part of the day and had very little drink. He had consumed 2 beers but was not intoxicated. No history of seizure.  He was initially evaluated by EMT personnel at the camp site and was not taken in. After one or 2 hours his friends noted that he seemed confused intermittently at that point he was taken by EMS to [facility]. CT of head revealed no evidence for hemorrhage. There was mention of fracture deformity medial left orbital wall projecting into the left anterior ethmoid sinus. There was question of whether this is chronic in nature. Patient was not placed on any antibiotics. He denies any other testing.  He did have some headaches yesterday but these are improved today. He is slightly slower thinking but no severe confusion and no further amnesia. Earlier today he was blowing his nose and closed off the right nostril and while  blowing had some swelling underneath the left eye. Normal extraocular movements. Denies any focal weakness. No balance issues. Tetanus booster was given in ER  Patient's had no dizziness whatsoever today. No orthostatic symptoms. No palpitations. He does take blood pressure medication with Bystolic but has not taken his medication today.  Past Medical History  Diagnosis Date  . Hypertension   . Depression   . Anxiety   . Seizures     febrile  . Jaundice, newborn   . Blood in stool   . Panic disorder   . Allergy    Past  Surgical History  Procedure Laterality Date  . Wisdom tooth extraction  1991    reports that he has never smoked. He does not have any smokeless tobacco history on file. His alcohol and drug histories are not on file. family history includes Colitis in his brother; Dementia in his mother; Diabetes in his father; Heart disease (age of onset: 51) in his father; Hyperlipidemia in his father; Hypertension in his mother; Skin cancer in his mother; Stroke in his father and mother. No Known Allergies    Review of Systems  Constitutional: Negative for fever, chills and appetite change.  HENT: Negative for ear discharge and ear pain.   Respiratory: Negative for cough and shortness of breath.   Cardiovascular: Negative for chest pain.  Gastrointestinal: Negative for abdominal pain.  Genitourinary: Negative for dysuria.  Neurological: Negative for dizziness, seizures, speech difficulty, weakness and light-headedness. Headaches: Yesterday but none today.       Objective:   Physical Exam  Constitutional: He is oriented to person, place, and time. He appears well-developed and well-nourished. No distress.  HENT:  Right Ear: External ear normal.  Left Ear: External ear normal.  Small abrasion left forehead region. No hematoma He has some mild bruising of the left periorbital region. Small amount of swelling involving area just below the left lower lid region  Nasal passages clear  Eyes: EOM are normal.  Extraocular movements are all normal  Neck: Neck supple.  Cardiovascular: Normal rate and regular rhythm.   Pulmonary/Chest: Effort normal and breath sounds normal. No respiratory distress. He has no wheezes. He has no rales.  Musculoskeletal: He exhibits no edema.  No left-sided rib tenderness.  Lymphadenopathy:    He has no cervical adenopathy.  Neurological: He is alert and oriented to person, place, and time. No cranial nerve deficit.  Psychiatric: He has a normal mood and affect. His  behavior is normal. Judgment and thought content normal.          Assessment & Plan:  #1 concussion with loss of consciousness yesterday. Question is whether he tripped and fell versus orthostasis versus syncope from other cause.  CT head as above with no evidence for hemorrhage. He seems fairly clear today and headache is improved. We talked about avoidance of repeat head injury to reduce second impact syndrome. We discussed importance of rest both cognitively and physically #2 left medial orbital wall fracture communicating into the left anterior ethmoid sinus. We've recommended starting antibiotics prophylaxis with amoxicillin 875 mg twice daily for 10 days. Follow-up immediately free signs of infection such as redness or warmth or fever #3 loss of consciousness. Etiology unclear as above.  We check standing blood pressure and this was 142/80. We do not have full extent of workup in ED but apparently according to patient he thinks this was fairly minimal. He does not describe anything to selling seizure. He has never had any history of syncope. He thinks he may of been slightly dehydrated yesterday. We recommended immediate follow-up if he has any further dizziness but he seems very stable this time #4 left back pain. Suspect muscular. He does not have any specific tenderness over his rib cage area. He'll try over-the-counter Advil or Aleve

## 2015-08-26 ENCOUNTER — Encounter: Payer: Self-pay | Admitting: Vascular Surgery

## 2015-08-27 ENCOUNTER — Encounter: Payer: Self-pay | Admitting: Vascular Surgery

## 2015-08-27 ENCOUNTER — Ambulatory Visit (HOSPITAL_COMMUNITY)
Admission: RE | Admit: 2015-08-27 | Discharge: 2015-08-27 | Disposition: A | Discharge status: Home / Self Care | Payer: BLUE CROSS/BLUE SHIELD | Source: Ambulatory Visit | Attending: Vascular Surgery | Admitting: Vascular Surgery

## 2015-08-27 ENCOUNTER — Ambulatory Visit (INDEPENDENT_AMBULATORY_CARE_PROVIDER_SITE_OTHER): Payer: BLUE CROSS/BLUE SHIELD | Admitting: Vascular Surgery

## 2015-08-27 VITALS — BP 132/80 | HR 50 | Temp 98.1°F | Resp 16 | Ht 69.0 in | Wt 277.3 lb

## 2015-08-27 DIAGNOSIS — R609 Edema, unspecified: Secondary | ICD-10-CM

## 2015-08-27 DIAGNOSIS — I8312 Varicose veins of left lower extremity with inflammation: Secondary | ICD-10-CM

## 2015-08-27 DIAGNOSIS — I8392 Asymptomatic varicose veins of left lower extremity: Secondary | ICD-10-CM | POA: Insufficient documentation

## 2015-08-27 NOTE — Progress Notes (Signed)
Patient name: Hector [name] MRN: [id number] DOB: July 29, 1971 Sex: male   Referred by: Elease Hashimoto  Reason for referral:  Chief Complaint  Patient presents with  . Venous Insufficiency    Has had episodes of cellulitis/ rash on both legs. Swelling in both legs which goes down during he night.     HISTORY OF PRESENT ILLNESS: Patient resents today for discussion of bilateral lower extremity swelling and left leg cellulitis. Also notes some itching and rash his bilateral lower extremities as well. Has a history of the past several months with an episode of cellulitis and swelling in his left leg and his calf region. This did respond to [facility] but has had persistent swelling. He feels this is more so in his left leg in his right leg although he does have swelling bilaterally. Does not have any history of DVT or varicosities. Does report what sounds like venous stasis ulceration in his father. He has attempted elevation of his legs for improvement of this and has not worn any compression garments. He does report that his swelling is resolving overnight and is progressive throughout the day.  Past Medical History  Diagnosis Date  . Hypertension   . Depression   . Anxiety   . Seizures     febrile  . Jaundice, newborn   . Blood in stool   . Panic disorder   . Allergy     Past Surgical History  Procedure Laterality Date  . Wisdom tooth extraction  1991    Social History   Social History  . Marital Status: Married    Spouse Name: N/A  . Number of Children: N/A  . Years of Education: N/A   Occupational History  . Not on file.   Social History Main Topics  . Smoking status: Never Smoker   . Smokeless tobacco: Not on file  . Alcohol Use: Not on file  . Drug Use: Not on file  . Sexual Activity: Not on file   Other Topics Concern  . Not on file   Social History Narrative    Family History  Problem Relation Age of Onset  . Dementia Mother   . Stroke Mother   .  Hypertension Mother   . Skin cancer Mother     basal and squamos  . Diabetes Father   . Hyperlipidemia Father   . Stroke Father   . Heart disease Father 74  . Colitis Brother     Allergies as of 08/27/2015  . (No Known Allergies)    Current Outpatient Prescriptions on File Prior to Visit  Medication Sig Dispense Refill  . amoxicillin (AMOXIL) 875 MG tablet Take 1 tablet (875 mg total) by mouth 2 (two) times daily. 20 tablet 0  . FLUoxetine (PROZAC) 10 MG capsule Take 1 capsule (10 mg total) by mouth daily. 7 capsule 0  . LORazepam (ATIVAN) 1 MG tablet Take 1 tablet (1 mg total) by mouth every 8 (eight) hours as needed. 5 tablet 0  . nebivolol (BYSTOLIC) 10 MG tablet TAKE 1 TABLET DAILY 7 tablet 0   No current facility-administered medications on file prior to visit.     REVIEW OF SYSTEMS:  Positives indicated with an "X"  CARDIOVASCULAR:  [ ]  chest pain   [ ]  chest pressure   [ ]  palpitations   [ ]  orthopnea   [ ]  dyspnea on exertion   [ ]  claudication   [ ]  rest pain   [ ]  DVT   [ ]   phlebitis PULMONARY:   [ ]  productive cough   [ ]  asthma   [ ]  wheezing NEUROLOGIC:   [ ]  weakness  [ ]  paresthesias  [ ]  aphasia  [ ]  amaurosis  [ ]  dizziness HEMATOLOGIC:   [ ]  bleeding problems   [ ]  clotting disorders MUSCULOSKELETAL:  [ ]  joint pain   [ ]  joint swelling GASTROINTESTINAL: [ ]   blood in stool  [ ]   hematemesis GENITOURINARY:  [ ]   dysuria  [ ]   hematuria PSYCHIATRIC:  [ ]  history of major depression INTEGUMENTARY:  [ ]  rashes  [ ]  ulcers CONSTITUTIONAL:  [ ]  fever   [ ]  chills  PHYSICAL EXAMINATION:  General: The patient is a well-nourished male, in no acute distress. Vital signs are BP 132/80 mmHg  Pulse 50  Temp(Src) 98.1 F (36.7 C) (Oral)  Resp 16  Ht 5' 9"  (1.753 m)  Wt 277 lb 4.8 oz (125.782 kg)  BMI 40.93 kg/m2  SpO2 97% Pulmonary: There is a good air exchange Musculoskeletal: There are no major deformities.  There is no significant extremity  pain. Neurologic: No focal weakness or paresthesias are detected, Skin: There are no ulcer or rashes noted. Psychiatric: The patient has normal affect. Cardiovascular: 2+ dorsalis pedis pulses bilaterally Does have mild pitting edema bilaterally with swelling extending onto his feet bilaterally. No varicosities or telangiectasia noted. No hemosiderin deposits.   [facility] Vascular Lab Studies:  Ordered and Independently Reviewed he underwent study in his left formal duplex of his left leg only. This does show dilatation of his great saphenous vein up to 5-1/2-6 mm. Reflux in his saphenous vein was noted. No dilatation or reflux in the small saphenous vein. He did not have a formal duplex of his right. I did image this with SonoSite ultrasound and he does have significant dilatation of the saphenous vein throughout her right leg as well. On the left he does have deep venous reflux in his common femoral vein, femoral vein and popliteal vein  Impression and Plan:  Symptomatic venous hypertension related to superficial and deep vein reflux. I had a very long discussion regarding the significance of this. He has not worn compression. I did ask explain the importance of elevation and compression for symptom control. Also explained that this can progress to the level of skin breakdown which his father has experienced in her goal is to prevent the ongoing progression. Given a prescription today for thigh high 20-30 mmHg graduated compression garments and struck him on the use of this and elevation. We will see him again in 3 months for continued discussion. He could potentially benefit from ablation of his great saphenous vein for correction of superficial reflux. We'll defer recommendation of this pending his response to compression garments elevation.    Curt Jews [facility] Office: [phone]

## 2015-10-03 ENCOUNTER — Other Ambulatory Visit: Payer: Self-pay | Admitting: Family Medicine

## 2015-10-03 MED ORDER — NEBIVOLOL HCL 10 MG PO TABS: ORAL_TABLET | ORAL | Status: DC

## 2015-10-03 NOTE — Telephone Encounter (Signed)
Pt needs new rx  bystolic 10 mg #49 w/[facility]ills [facility]

## 2015-10-03 NOTE — Telephone Encounter (Signed)
Rx sent 

## 2015-10-25 ENCOUNTER — Telehealth: Payer: Self-pay | Admitting: Family Medicine

## 2015-10-25 ENCOUNTER — Other Ambulatory Visit: Payer: Self-pay | Admitting: Family Medicine

## 2015-10-25 MED ORDER — FLUOXETINE HCL 10 MG PO CAPS: 10.0000 mg | ORAL_CAPSULE | Freq: Every day | ORAL | Status: DC

## 2015-10-25 NOTE — Telephone Encounter (Signed)
Pt last visit 08/23/15 and last Rx refill 08/23/15 #7

## 2015-10-25 NOTE — Telephone Encounter (Signed)
OK to refill until mail rx arrives.

## 2015-10-25 NOTE — Telephone Encounter (Signed)
Rx was call in. Left a voice mail for the pt

## 2015-10-25 NOTE — Telephone Encounter (Signed)
Pt request refill FLUoxetine (PROZAC) 10 MG capsule Wife states pt takes 1 /day  [facility]  Pt needs 7 tabs sent to local pharm to get him through until rx arrives in mail.  [facility] [address]

## 2015-11-13 ENCOUNTER — Other Ambulatory Visit: Payer: Self-pay | Admitting: Family Medicine

## 2015-11-13 NOTE — Telephone Encounter (Signed)
Please advise on refill how many tablets? It says 5 but was given 20 last time.

## 2015-11-14 NOTE — Telephone Encounter (Signed)
May refill #20.   Avoid regular use.

## 2015-11-25 ENCOUNTER — Other Ambulatory Visit: Payer: Self-pay | Admitting: Family Medicine

## 2015-11-29 ENCOUNTER — Encounter: Payer: Self-pay | Admitting: Vascular Surgery

## 2015-12-03 ENCOUNTER — Ambulatory Visit: Payer: BLUE CROSS/BLUE SHIELD | Admitting: Vascular Surgery

## 2015-12-10 ENCOUNTER — Encounter: Payer: Self-pay | Admitting: Internal Medicine

## 2015-12-25 ENCOUNTER — Other Ambulatory Visit: Payer: Self-pay | Admitting: Family Medicine

## 2016-02-10 ENCOUNTER — Other Ambulatory Visit (INDEPENDENT_AMBULATORY_CARE_PROVIDER_SITE_OTHER): Payer: BLUE CROSS/BLUE SHIELD

## 2016-02-10 ENCOUNTER — Ambulatory Visit (INDEPENDENT_AMBULATORY_CARE_PROVIDER_SITE_OTHER): Payer: BLUE CROSS/BLUE SHIELD | Admitting: Internal Medicine

## 2016-02-10 ENCOUNTER — Encounter: Payer: Self-pay | Admitting: Internal Medicine

## 2016-02-10 VITALS — BP 154/100 | HR 60 | Ht 69.0 in | Wt 283.2 lb

## 2016-02-10 DIAGNOSIS — R112 Nausea with vomiting, unspecified: Secondary | ICD-10-CM

## 2016-02-10 DIAGNOSIS — K580 Irritable bowel syndrome with diarrhea: Secondary | ICD-10-CM | POA: Diagnosis not present

## 2016-02-10 DIAGNOSIS — R195 Other fecal abnormalities: Secondary | ICD-10-CM

## 2016-02-10 DIAGNOSIS — R140 Abdominal distension (gaseous): Secondary | ICD-10-CM

## 2016-02-10 DIAGNOSIS — R197 Diarrhea, unspecified: Secondary | ICD-10-CM

## 2016-02-10 LAB — CBC WITH DIFFERENTIAL/PLATELET
BASOS ABS: 0 10*3/uL (ref 0.0–0.1)
Basophils Relative: 0.5 % (ref 0.0–3.0)
EOS ABS: 0.1 10*3/uL (ref 0.0–0.7)
Eosinophils Relative: 2.3 % (ref 0.0–5.0)
HCT: 46 % (ref 39.0–52.0)
Hemoglobin: 15.7 g/dL (ref 13.0–17.0)
LYMPHS ABS: 2 10*3/uL (ref 0.7–4.0)
LYMPHS PCT: 30.5 % (ref 12.0–46.0)
MCHC: 34.2 g/dL (ref 30.0–36.0)
MCV: 88.2 fl (ref 78.0–100.0)
MONO ABS: 0.4 10*3/uL (ref 0.1–1.0)
Monocytes Relative: 6.8 % (ref 3.0–12.0)
NEUTROS PCT: 59.9 % (ref 43.0–77.0)
Neutro Abs: 3.9 10*3/uL (ref 1.4–7.7)
PLATELETS: 172 10*3/uL (ref 150.0–400.0)
RBC: 5.21 Mil/uL (ref 4.22–5.81)
RDW: 12.9 % (ref 11.5–15.5)
WBC: 6.6 10*3/uL (ref 4.0–10.5)

## 2016-02-10 LAB — COMPREHENSIVE METABOLIC PANEL
ALT: 23 U/L (ref 0–53)
AST: 19 U/L (ref 0–37)
Albumin: 4.3 g/dL (ref 3.5–5.2)
Alkaline Phosphatase: 72 U/L (ref 39–117)
BILIRUBIN TOTAL: 0.7 mg/dL (ref 0.2–1.2)
BUN: 22 mg/dL (ref 6–23)
CO2: 28 meq/L (ref 19–32)
Calcium: 9.6 mg/dL (ref 8.4–10.5)
Chloride: 108 mEq/L (ref 96–112)
Creatinine, Ser: 1.14 mg/dL (ref 0.40–1.50)
GFR: 74.06 mL/min (ref >60.00)
GLUCOSE: 105 mg/dL — AB (ref 70–99)
Potassium: 4.4 mEq/L (ref 3.5–5.1)
Sodium: 143 mEq/L (ref 135–145)
Total Protein: 7.4 g/dL (ref 6.0–8.3)

## 2016-02-10 LAB — TSH: TSH: 1.78 u[IU]/mL (ref 0.35–4.50)

## 2016-02-10 LAB — HIGH SENSITIVITY CRP: CRP, High Sensitivity: 1.31 mg/L (ref 0.000–5.000)

## 2016-02-10 LAB — IGA: IgA: 365 mg/dL (ref 68–378)

## 2016-02-10 NOTE — Progress Notes (Signed)
Patient ID: Hector [name], male   DOB: 02-Jun-1971, 45 y.o.   MRN: [id number] HPI: Hector [name] is a 45 year old male with a past medical history of hypertension, sleep apnea, obesity, anxiety and depression who seen in consultation at the request of Dr. Elease Hashimoto to evaluate episodes of nausea, vomiting and diarrhea. He is here alone today. He reports that he has had 3-4 episodes of intense nausea, vomiting and diarrhea which lasts up to 48 hours over the last 2-3 years. This last occurred in November 2016 which prompted him to ask for referral. Prior to that he had an episode in April 2015 and April 2016. Outside of these episodes he feels generally well. He does have postprandial loose stools which is long-standing for him. This occurs 3-4 times per day. His stools are rarely formed. He denies blood in his stool or melena. He does still with frequent gas and some bloating. He has had no weight loss in fact some weight gain about 15 pounds in the last several months. He states he does not eat a very healthy diet. He denies nausea, vomiting. No dysphagia or odynophagia. No issue with frequent heartburn. He does have issue with lower extremity rash which is been blamed on eczema. Family history notable for brother with ulcerative colitis. A father with colon polyps.  He had a previous sigmoidoscopy in 2005 and there was a question of a polyp. This lead to a full colonoscopy which he reports was normal without polyps being found/removed.    Past Medical History  Diagnosis Date  . Hypertension   . Depression   . Anxiety   . Seizures (HCC)     febrile  . Jaundice, newborn   . Blood in stool   . Panic disorder   . Allergy   . Sleep apnea   . Anal fissure     Past Surgical History  Procedure Laterality Date  . Wisdom tooth extraction  1991  . Hemorrhoid banding      Outpatient Prescriptions Prior to Visit  Medication Sig Dispense Refill  . FLUoxetine (PROZAC) 10 MG capsule TAKE 1 CAPSULE (10 MG  TOTAL) BY MOUTH DAILY. 30 capsule 5  . LORazepam (ATIVAN) 1 MG tablet TAKE 1 TABLET BY MOUTH EVERY 8 HOURS AS NEEDED 20 tablet 0  . nebivolol (BYSTOLIC) 10 MG tablet TAKE 1 TABLET DAILY 90 tablet 3  . amoxicillin (AMOXIL) 875 MG tablet Take 1 tablet (875 mg total) by mouth 2 (two) times daily. 20 tablet 0  . FLUoxetine (PROZAC) 10 MG capsule Take 1 capsule (10 mg total) by mouth daily. 7 capsule 0  . FLUoxetine (PROZAC) 20 MG capsule TAKE 1 CAPSULE DAILY 90 capsule 1   No facility-administered medications prior to visit.    No Known Allergies  Family History  Problem Relation Age of Onset  . Dementia Mother   . Stroke Mother   . Hypertension Mother   . Skin cancer Mother     basal and squamos  . Diabetes Father   . Hyperlipidemia Father   . Stroke Father   . Heart disease Father 79  . Colitis Brother   . Colon polyps Father   . Colon polyps Brother     Social History  Substance Use Topics  . Smoking status: Never Smoker   . Smokeless tobacco: Never Used  . Alcohol Use: 0.0 oz/week    0 Standard drinks or equivalent per week     Comment: 1-5 per week    ROS:  As per history of present illness, otherwise negative  BP 154/100 mmHg  Pulse 60  Ht 5' 9"  (1.753 m)  Wt 283 lb 4 oz (128.481 kg)  BMI 41.81 kg/m2 Constitutional: Well-developed and well-nourished. No distress. HEENT: Normocephalic and atraumatic. Oropharynx is clear and moist. No oropharyngeal exudate. Conjunctivae are normal.  No scleral icterus. Neck: Neck supple. Trachea midline. Cardiovascular: Normal rate, regular rhythm and intact distal pulses. No M/R/G Pulmonary/chest: Effort normal and breath sounds normal. No wheezing, rales or rhonchi. Abdominal: Soft, obese, nontender, nondistended. Bowel sounds active throughout. There are no masses palpable. No hepatosplenomegaly. Extremities: no clubbing, cyanosis, or edema Lymphadenopathy: No cervical adenopathy noted. Neurological: Alert and oriented to person  place and time. Skin: Skin is warm and dry. Excoriation right inner lower extremity from scratching, sandpaper red papules without ulceration Psychiatric: Normal mood and affect. Behavior is normal.  RELEVANT LABS AND IMAGING: CBC    Component Value Date/Time   WBC 6.2 05/27/2015 1050   RBC 4.72 05/27/2015 1050   HGB 14.0 05/27/2015 1050   HCT 39.3 05/27/2015 1050   PLT 157 05/27/2015 1050   MCV 83.3 05/27/2015 1050   MCH 29.7 05/27/2015 1050   MCHC 35.6 05/27/2015 1050   RDW 12.0 05/27/2015 1050   LYMPHSABS 1.7 05/27/2015 1050   MONOABS 0.3 05/27/2015 1050   EOSABS 0.2 05/27/2015 1050   BASOSABS 0.0 05/27/2015 1050    CMP     Component Value Date/Time   NA 143 05/27/2015 1050   K 3.9 05/27/2015 1050   CL 109 05/27/2015 1050   CO2 26 05/27/2015 1050   GLUCOSE 105* 05/27/2015 1050   BUN 19 05/27/2015 1050   CREATININE 1.08 05/27/2015 1050   CALCIUM 8.8* 05/27/2015 1050   PROT 6.7 05/27/2015 1050   ALBUMIN 3.7 05/27/2015 1050   AST 19 05/27/2015 1050   ALT 17 05/27/2015 1050   ALKPHOS 66 05/27/2015 1050   BILITOT 0.9 05/27/2015 1050   GFRNONAA >60 05/27/2015 1050   GFRAA >60 05/27/2015 1050    ASSESSMENT/PLAN: 45 year old male with a past medical history of hypertension, sleep apnea, obesity, anxiety and depression who seen in consultation at the request of Dr. Elease Hashimoto to evaluate episodes of nausea, vomiting and diarrhea.  1. Episodic nausea/vomiting/diarrhea/IBS -- he has had discrete episodic nausea and vomiting with diarrhea 3-4 times in the last several years. This is short-lived and most likely related to an infectious etiology. In between he has not had severe symptoms though he does have frequent loose stools associated with gas and bloating. I expect he has an element of irritable bowel and cannot exclude bacterial overgrowth. I recommended checking a CBC, CMP, celiac panel, CRP, TSH and H. pylori stool antigen. If testing is negative I recommend rifaximin 550  mg 3 times a day 14 days for IBS with diarrhea. I would also follow this with VSL 32 capsules daily 1 month and 1 capsule daily thereafter. Follow-up in 3-4 months. Sooner if necessary      XY:BF[address], [address] [place], [place] [id number]

## 2016-02-10 NOTE — Patient Instructions (Signed)
Your physician has requested that you go to the basement for the following lab work before leaving today: CRP, CBC, CMP, TSH, Celiac, H Pylori Stool  If your labs are negative, we may try you on Xifaxan antibiotic, followed by VSL #3  Please follow up with Dr Hilarie Fredrickson in 3-4 months.

## 2016-02-11 ENCOUNTER — Other Ambulatory Visit: Payer: BLUE CROSS/BLUE SHIELD

## 2016-02-11 LAB — TISSUE TRANSGLUTAMINASE, IGA: Tissue Transglutaminase Ab, IgA: 1 U/mL (ref <4)

## 2016-02-12 LAB — HELICOBACTER PYLORI  SPECIAL ANTIGEN: H. PYLORI Antigen: NOT DETECTED

## 2016-02-25 ENCOUNTER — Other Ambulatory Visit: Payer: Self-pay | Admitting: Family Medicine

## 2016-03-02 ENCOUNTER — Telehealth: Payer: Self-pay | Admitting: Internal Medicine

## 2016-03-02 ENCOUNTER — Encounter: Payer: Self-pay | Admitting: *Deleted

## 2016-03-02 NOTE — Telephone Encounter (Signed)
Left voicemail for patient to call back. 

## 2016-03-02 NOTE — Telephone Encounter (Signed)
Okay to go forward with Xifaxan rx?

## 2016-03-02 NOTE — Telephone Encounter (Signed)
Yes can precede with rifaximin 550 mg TID x 14 days for IBS-D

## 2016-03-02 NOTE — Telephone Encounter (Signed)
Patient returned phone call. [facility] # [phone]

## 2016-03-02 NOTE — Telephone Encounter (Signed)
Information faxed to [facility]. Waiting to advise patient when he calls back.

## 2016-03-02 NOTE — Telephone Encounter (Signed)
Patient advised of information below and verbalizes understanding.

## 2016-03-03 ENCOUNTER — Telehealth: Payer: Self-pay | Admitting: Internal Medicine

## 2016-03-03 NOTE — Telephone Encounter (Signed)
Noted  

## 2016-03-04 ENCOUNTER — Telehealth: Payer: Self-pay | Admitting: *Deleted

## 2016-03-04 ENCOUNTER — Telehealth: Payer: Self-pay | Admitting: Internal Medicine

## 2016-03-04 MED ORDER — RIFAXIMIN 550 MG PO TABS: 550.0000 mg | ORAL_TABLET | Freq: Three times a day (TID) | ORAL | Status: DC

## 2016-03-04 NOTE — Telephone Encounter (Signed)
[facility] advised.

## 2016-03-04 NOTE — Telephone Encounter (Signed)
Patient prefers rx to [facility] for [facility]an instead of [facility]. Rx sent to [facility].

## 2016-03-27 ENCOUNTER — Telehealth: Payer: Self-pay | Admitting: Internal Medicine

## 2016-03-27 NOTE — Telephone Encounter (Signed)
Patient advised of VSL dosing and he scheduled a 4 month follow up appointment with Dr Hilarie Fredrickson.

## 2016-05-19 ENCOUNTER — Other Ambulatory Visit: Payer: Self-pay | Admitting: Family Medicine

## 2016-05-19 NOTE — Telephone Encounter (Signed)
Last OV was 08/23/2015 Last refill 02/25/2016 #20 No pending appt Please advise

## 2016-05-20 NOTE — Telephone Encounter (Signed)
Refill once.  Continue to avoid regular use.  Only uses for severe breakthrough Panic anxiety symptoms.

## 2016-06-01 ENCOUNTER — Ambulatory Visit (INDEPENDENT_AMBULATORY_CARE_PROVIDER_SITE_OTHER): Payer: BLUE CROSS/BLUE SHIELD | Admitting: Internal Medicine

## 2016-06-01 ENCOUNTER — Encounter: Payer: Self-pay | Admitting: Internal Medicine

## 2016-06-01 VITALS — BP 120/82 | HR 88 | Ht 69.0 in | Wt 291.4 lb

## 2016-06-01 DIAGNOSIS — R195 Other fecal abnormalities: Secondary | ICD-10-CM

## 2016-06-01 DIAGNOSIS — K589 Irritable bowel syndrome without diarrhea: Secondary | ICD-10-CM

## 2016-06-01 NOTE — Progress Notes (Signed)
Subjective:    Patient ID: [name], male    DOB: 25-Mar-1971, 45 y.o.   MRN: [id number]  HPI Hiroki Wint is a 45 year old male with history of episodic nausea with vomiting, diarrhea and IBS who is here for follow-up. He also has a history of hypertension, obesity, sleep apnea, anxiety and depression. He is here alone today after initially being seen on 02/10/2016. He was treated with a course of rifaximin 550 mg 3 times a day 14 days for IBS with diarrhea. VSL was recommended as a probiotic thereafter but he only took this for 1 or 2 days. He notes improvement both in his gas, bloating and belching. His loose stools, predominantly postprandial, are better. He has eliminated sorbitol from his diet. He still will have loose stools 2-3 times per day on most days but they have been less bothersome for him. Lately his stools have been greenish in color. He denies blood in his stool or melena. He's had no further episodes of nausea or vomiting. Appetite has been good but he admits to eating foods that are "not really good for me".  Review of Systems As per history of present illness, otherwise negative  Current Medications, Allergies, Past Medical History, Past Surgical History, Family History and Social History were reviewed in [employer] record.     Objective:   Physical Exam BP 120/82 mmHg  Pulse 88  Ht 5' 9"  (1.753 m)  Wt 291 lb 6.4 oz (132.178 kg)  BMI 43.01 kg/m2 Constitutional: Well-developed and well-nourished. No distress. HEENT: Normocephalic and atraumatic. Conjunctivae are normal.  No scleral icterus. Neck: Neck supple. Trachea midline. Cardiovascular: Normal rate, regular rhythm and intact distal pulses. No M/R/G Pulmonary/chest: Effort normal and breath sounds normal.  Abdominal: Soft, obese, nontender, nondistended. Bowel sounds active throughout.  Extremities: no clubbing, cyanosis, or edema Neurological: Alert and oriented to person place and  time. Skin: Skin is warm and dry.  Psychiatric: Normal mood and affect. Behavior is normal.  CBC    Component Value Date/Time   WBC 6.6 02/10/2016 1050   RBC 5.21 02/10/2016 1050   HGB 15.7 02/10/2016 1050   HCT 46.0 02/10/2016 1050   PLT 172.0 02/10/2016 1050   MCV 88.2 02/10/2016 1050   MCH 29.7 05/27/2015 1050   MCHC 34.2 02/10/2016 1050   RDW 12.9 02/10/2016 1050   LYMPHSABS 2.0 02/10/2016 1050   MONOABS 0.4 02/10/2016 1050   EOSABS 0.1 02/10/2016 1050   BASOSABS 0.0 02/10/2016 1050    CMP     Component Value Date/Time   NA 143 02/10/2016 1050   K 4.4 02/10/2016 1050   CL 108 02/10/2016 1050   CO2 28 02/10/2016 1050   GLUCOSE 105* 02/10/2016 1050   BUN 22 02/10/2016 1050   CREATININE 1.14 02/10/2016 1050   CALCIUM 9.6 02/10/2016 1050   PROT 7.4 02/10/2016 1050   ALBUMIN 4.3 02/10/2016 1050   AST 19 02/10/2016 1050   ALT 23 02/10/2016 1050   ALKPHOS 72 02/10/2016 1050   BILITOT 0.7 02/10/2016 1050   GFRNONAA >60 05/27/2015 1050   GFRAA >60 05/27/2015 1050    Celiac panel neg CRP normal H pylori Stool Ag neg     Assessment & Plan:  45 year old male with history of episodic nausea with vomiting, diarrhea and IBS who is here for follow-up.   1. IBS with loose stools/episodic n/v -- He seems to have benefited from rifaximin therapy. He has not committed to probiotic to this point.  He is having some intermittent loose stools but no other alarm symptoms. I have recommended that he begin Benefiber 1 tablespoon daily to try to help bulk his stool and prevent the postprandial urgency he can experience. Other option should this be unhelpful would be repeat, longer trial of probiotic such as VSL 3 or trial of bile acid sequestrant.  I will have him follow-up in 3-4 months to ensure he is improving 15 minutes spent with the patient today. Greater than 50% was spent in counseling and coordination of care with the patient

## 2016-06-01 NOTE — Patient Instructions (Signed)
Please purchase the following medications over the counter and take as directed: Benefiber 1 tablespoon daily  Call our office in 1 month with an update on how you are doing. Our office number is [phone].  Follow up with Dr Hilarie Fredrickson in 6 months.  If you are age 45 or older, your body mass index should be between 23-30. Your Body mass index is 43.01 kg/(m^2). If this is out of the aforementioned range listed, please consider follow up with your Primary Care Provider.  If you are age 73 or younger, your body mass index should be between 19-25. Your Body mass index is 43.01 kg/(m^2). If this is out of the aformentioned range listed, please consider follow up with your Primary Care Provider.

## 2016-06-09 ENCOUNTER — Other Ambulatory Visit: Payer: Self-pay | Admitting: General Practice

## 2016-06-09 MED ORDER — FLUOXETINE HCL 10 MG PO CAPS: ORAL_CAPSULE | ORAL | Status: DC

## 2016-07-09 ENCOUNTER — Encounter: Payer: Self-pay | Admitting: Family Medicine

## 2016-07-15 ENCOUNTER — Telehealth: Payer: Self-pay | Admitting: Family Medicine

## 2016-07-15 NOTE — Telephone Encounter (Signed)
Needs office follow up soon.  If he is having more frequent anxiety symptoms, we need to get in to discuss getting back on Fluoxetine or similar med.  May refill #15 but this should be used sparingly for severe symptoms.

## 2016-07-15 NOTE — Telephone Encounter (Signed)
Pt needs a refill on lorazepam 1 mg #20

## 2016-07-15 NOTE — Telephone Encounter (Signed)
Last refill 05/20/2016 #20, 0rf Last OV 08/23/2015 No pending scheduled Please advise

## 2016-07-16 ENCOUNTER — Telehealth: Payer: Self-pay | Admitting: General Practice

## 2016-07-16 MED ORDER — LORAZEPAM 1 MG PO TABS: ORAL_TABLET | ORAL | Status: DC

## 2016-07-16 NOTE — Addendum Note (Signed)
Addended by: Agnes Lawrence on: 07/16/2016 05:28 PM   Modules accepted: Orders

## 2016-07-16 NOTE — Telephone Encounter (Signed)
I left a message for the pt to return my call.

## 2016-07-16 NOTE — Telephone Encounter (Signed)
See prior note

## 2016-07-16 NOTE — Telephone Encounter (Signed)
Patient called back and I informed him of the message below and he is aware the Rx was called to his pharmacy.  He stated he does need to make an appt as he has been having chest pain and bilateral elbow pain recently.  States the pain he would rate as a 3 in his chest lasts for approximately 20 min at a time, denies any shortness of breath, denies dizziness or nausea and does not feel lightheaded.  I scheduled the pt an appt for tomorrow with Dr Elease Hashimoto and advised him to go to the ER if he develops any severe chest pain with nausea, shortness of breath or other symptoms in the meantime and he agreed.

## 2016-07-16 NOTE — Telephone Encounter (Signed)
Forwarded to Dr Elease Hashimoto as Juluis Rainier.

## 2016-07-17 ENCOUNTER — Ambulatory Visit (INDEPENDENT_AMBULATORY_CARE_PROVIDER_SITE_OTHER): Payer: BLUE CROSS/BLUE SHIELD | Admitting: Family Medicine

## 2016-07-17 ENCOUNTER — Encounter: Payer: Self-pay | Admitting: Family Medicine

## 2016-07-17 VITALS — BP 110/80 | HR 76 | Temp 97.8°F | Ht 69.0 in | Wt 287.0 lb

## 2016-07-17 DIAGNOSIS — R0789 Other chest pain: Secondary | ICD-10-CM | POA: Diagnosis not present

## 2016-07-17 MED ORDER — NITROGLYCERIN 0.4 MG SL SUBL: 0.4000 mg | SUBLINGUAL_TABLET | SUBLINGUAL | Status: DC | PRN

## 2016-07-17 NOTE — Patient Instructions (Signed)
-  go ahead with baby aspirin one daily -avoid heavy exertional activity for now - we will be setting up further cardiology evaluation - If you have any recurrent or persistent chest pain call 911 or go to ER -consider start OTC Zantac or Pepcid.
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
## 2016-07-17 NOTE — Progress Notes (Signed)
Pre visit review using our clinic review tool, if applicable. No additional management support is needed unless otherwise documented below in the visit note. 

## 2016-07-21 ENCOUNTER — Encounter: Payer: Self-pay | Admitting: *Deleted

## 2016-07-21 ENCOUNTER — Ambulatory Visit (INDEPENDENT_AMBULATORY_CARE_PROVIDER_SITE_OTHER): Payer: BLUE CROSS/BLUE SHIELD | Admitting: Interventional Cardiology

## 2016-07-21 VITALS — BP 160/88 | HR 50 | Ht 69.0 in | Wt 283.6 lb

## 2016-07-21 DIAGNOSIS — I10 Essential (primary) hypertension: Secondary | ICD-10-CM

## 2016-07-21 DIAGNOSIS — E785 Hyperlipidemia, unspecified: Secondary | ICD-10-CM | POA: Diagnosis not present

## 2016-07-21 DIAGNOSIS — R079 Chest pain, unspecified: Secondary | ICD-10-CM

## 2016-07-21 NOTE — Patient Instructions (Addendum)
Medication Instructions:   Your physician recommends that you continue on your current medications as directed. Please refer to the Current Medication list given to you today.   If you need a refill on your cardiac medications before your next appointment, please call your pharmacy.  Labwork: NONE ORDER TODAY    Testing/Procedures: Your physician has requested that you have a stress echocardiogram. For further information please visit [email or web address]. Please follow instruction sheet as given.     Follow-Up: 3  WEEKS AFTER STRESS ECHO WITH AN AVAILABLE APP   Any Other Special Instructions Will Be Listed Below (If Applicable).

## 2016-07-21 NOTE — Addendum Note (Signed)
Addended by: [name] on: 07/21/2016 08:19 AM   Modules accepted: Orders

## 2016-07-21 NOTE — Progress Notes (Signed)
Cardiology Office Note  NEW PATIENT    Date:  07/21/2016   ID:  [name], DOB 01-19-1971, MRN [id number]  PCP:  [name], MD  Cardiologist:  Dr. Irish Lack, new     Chief Complaint  Patient presents with  . Chest Pain    and bil arm pain       History of Present Illness: Hector [name] is a 45 y.o. male who presents at request of Dr. Elease Hashimoto  for 2 episodes of chest pressure. In their office the EKG with more prominent T wave inversion in V2-3.    He has hx of HTN, anxiety and OSA but could not tolerate the mask.  Pt describes two episodes of chest pressure, the first he was angry and stressed and was moving a mattress.  Developed chest pressure and bil arm aching.  No nausea or SOB - with rest it resolved in 15 min.   The second episode about a week later at grassho[employer] game walking up and down stairs and very hot.  He then developed chest pressure and again bil. Arm discomfort.   Last 15-20 min with no associated symptoms.  He also has 10-15 sec of significant chest indigestion at night that may awaken him from sleep.    + FH of heart disease in his father and stroke in both parents.  His cholesterol has been stable.     Past Medical History:  Diagnosis Date  . Allergy   . Anal fissure   . Anxiety   . Blood in stool   . Depression   . Hypertension   . Irritable bowel syndrome with diarrhea   . Jaundice, newborn   . Panic disorder   . Seizures (HCC)    febrile  . Sleep apnea     Past Surgical History:  Procedure Laterality Date  . HEMORRHOID BANDING    . WISDOM TOOTH EXTRACTION  1991     Current Outpatient Prescriptions  Medication Sig Dispense Refill  . FLUoxetine (PROZAC) 10 MG capsule TAKE 1 CAPSULE (10 MG TOTAL) BY MOUTH DAILY. 90 capsule 0  . LORazepam (ATIVAN) 1 MG tablet Take 1 mg by mouth every 8 (eight) hours as needed for anxiety.    . nebivolol (BYSTOLIC) 10 MG tablet TAKE 1 TABLET DAILY 90 tablet 3  . nitroGLYCERIN (NITROSTAT) 0.4 MG  SL tablet Place 1 tablet (0.4 mg total) under the tongue every 5 (five) minutes as needed for chest pain. 20 tablet 3   No current facility-administered medications for this visit.     Allergies:   Review of patient's allergies indicates no known allergies.    Social History:  The patient  reports that he has never smoked. He has never used smokeless tobacco. He reports that he drinks alcohol. He reports that he uses drugs, including Marijuana.   Family History:  The patient's family history includes Colitis in his brother; Colon polyps in his brother and father; Dementia in his mother; Diabetes in his father; Heart attack in his father; Heart disease (age of onset: 4) in his father; Hyperlipidemia in his father; Hypertension in his mother; Skin cancer in his mother; Stroke in his father and mother.    ROS:  General:no colds or fevers, no weight changes Skin:no rashes or ulcers HEENT:no blurred vision, no congestion CV:see HPI PUL:see HPI GI:no diarrhea constipation or melena, no indigestion occ bright blood in stool from hemorrhoid  GU:no hematuria, no dysuria MS:no joint pain, no claudication, hx HNP  also hx of venous insuff.  Neuro:no syncope, no lightheadedness Endo:no diabetes, no thyroid disease  Wt Readings from Last 3 Encounters:  07/21/16 283 lb 9.6 oz (128.6 kg)  07/17/16 287 lb (130.2 kg)  06/01/16 291 lb 6.4 oz (132.2 kg)     PHYSICAL EXAM: VS:  BP (!) 160/88   Pulse (!) 50   Ht 5' 9"  (1.753 m)   Wt 283 lb 9.6 oz (128.6 kg)   BMI 41.88 kg/m  , BMI Body mass index is 41.88 kg/m. General:Pleasant affect, NAD Skin:Warm and dry, brisk capillary refill HEENT:normocephalic, sclera clear, mucus membranes moist Neck:supple, no JVD, no bruits  Heart:S1S2 RRR without murmur, gallup, rub or click Lungs:clear without rales, rhonchi, or wheezes QAE:SLPNP, soft, non tender, + BS, do not palpate liver spleen or masses Ext:tr lower ext edema, 2+ pedal pulses, 2+ radial  pulses Neuro:alert and oriented X 3, MAE, follows commands, + facial symmetry    EKG:  EKG is ordered today. The ekg ordered today demonstrates S brady rate of 50, t wave inversions in V1-3.    Recent Labs: 02/10/2016: ALT 23; BUN 22; Creatinine, Ser 1.14; Hemoglobin 15.7; Platelets 172.0; Potassium 4.4; Sodium 143; TSH 1.78    Lipid Panel    Component Value Date/Time   CHOL 195 03/13/2015 0908   TRIG 138.0 03/13/2015 0908   HDL 34.20 (L) 03/13/2015 0908   CHOLHDL 6 03/13/2015 0908   VLDL 27.6 03/13/2015 0908   LDLCALC 133 (H) 03/13/2015 0908       Other studies Reviewed: Additional studies/ records that were reviewed today include: previous EKG.   ASSESSMENT AND PLAN:  1. Chest pain,  Episodic with hx HTN obesity and + FH.  Dr. Irish Lack saw him and plan is for echo stress test.  If positive will plan for cath or if symptoms continue if normal he would need cath.    2. HTN elevated today but anxious with cardiology appt.   3. Hyperlipidemia see above if stress + would recommend statin.    Current medicines are reviewed with the patient today.  The patient Has no concerns regarding medicines.  The following changes have been made:  See above Labs/ tests ordered today include:see above  Disposition:   FU:  see above  Signed, Cecilie Kicks, NP  07/21/2016 1:32 PM    [facility] [place], [place], [place], [place] Phone: [phone]; Fax: [phone]   I have examined the patient and reviewed assessment and plan and discussed with patient.  Agree with above as stated.  We had a discussion regarding cath vs. Stress.  Will start with stress echo.  If abnormal, he will need cath.  He will need lifestyle changes going forward.   Larae Grooms

## 2016-08-10 ENCOUNTER — Telehealth (HOSPITAL_COMMUNITY): Payer: Self-pay | Admitting: Cardiology

## 2016-08-10 NOTE — Telephone Encounter (Signed)
Called pt and spoke with him to give him directions for Stress Echo that will be performed on 8/16. He voiced understanding.

## 2016-08-12 ENCOUNTER — Ambulatory Visit (HOSPITAL_BASED_OUTPATIENT_CLINIC_OR_DEPARTMENT_OTHER): Payer: BLUE CROSS/BLUE SHIELD

## 2016-08-12 ENCOUNTER — Ambulatory Visit (HOSPITAL_COMMUNITY): Payer: BLUE CROSS/BLUE SHIELD | Attending: Cardiology

## 2016-08-12 DIAGNOSIS — E669 Obesity, unspecified: Secondary | ICD-10-CM | POA: Insufficient documentation

## 2016-08-12 DIAGNOSIS — Z6841 Body Mass Index (BMI) 40.0 and over, adult: Secondary | ICD-10-CM | POA: Insufficient documentation

## 2016-08-12 DIAGNOSIS — R079 Chest pain, unspecified: Secondary | ICD-10-CM

## 2016-08-12 DIAGNOSIS — R0989 Other specified symptoms and signs involving the circulatory and respiratory systems: Secondary | ICD-10-CM

## 2016-08-12 NOTE — Telephone Encounter (Signed)
Close encounter 

## 2016-08-14 ENCOUNTER — Telehealth: Payer: Self-pay | Admitting: Interventional Cardiology

## 2016-08-14 ENCOUNTER — Telehealth: Payer: Self-pay | Admitting: *Deleted

## 2016-08-14 NOTE — Telephone Encounter (Signed)
LMOVM TO CALL BACK FOR RESULTS 

## 2016-08-14 NOTE — Telephone Encounter (Signed)
-----   Message from Consuelo Pandy, [place] sent at 08/13/2016  4:40 PM EDT ----- Normal stress echo. Please notify patient of results.

## 2016-08-14 NOTE — Telephone Encounter (Signed)
New message ° ° ° ° °Pt returning nurse call. Please call.  °

## 2016-08-14 NOTE — Telephone Encounter (Signed)
SPOKE TO PT ABOUT RESULTS AND VERBALIZED UNDERSTANDING AND RECONFIRMED APPOINTMENT TIME WTH DANA DUNN ON 09/02/2016

## 2016-08-18 ENCOUNTER — Encounter: Payer: Self-pay | Admitting: Physician Assistant

## 2016-09-02 ENCOUNTER — Ambulatory Visit (INDEPENDENT_AMBULATORY_CARE_PROVIDER_SITE_OTHER): Payer: BLUE CROSS/BLUE SHIELD | Admitting: Physician Assistant

## 2016-09-02 ENCOUNTER — Encounter: Payer: Self-pay | Admitting: Physician Assistant

## 2016-09-02 VITALS — BP 126/90 | HR 76 | Ht 69.0 in | Wt 283.1 lb

## 2016-09-02 DIAGNOSIS — R079 Chest pain, unspecified: Secondary | ICD-10-CM | POA: Diagnosis not present

## 2016-09-02 DIAGNOSIS — E7800 Pure hypercholesterolemia, unspecified: Secondary | ICD-10-CM | POA: Insufficient documentation

## 2016-09-02 DIAGNOSIS — I10 Essential (primary) hypertension: Secondary | ICD-10-CM

## 2016-09-02 NOTE — Progress Notes (Signed)
Cardiology Office Note    Date:  09/02/2016  ID:  Hector [name], DOB 07/27/1971, MRN [id number] PCP:  [name], MD  Cardiologist:  Irish Lack   Chief Complaint: f/u chest pain  History of Present Illness:  Hector [name] is a 45 y.o. male with history of anxiety, depression, HTN, IBS, seizures, sleep apnea, morbid obesity, resitng sinus bradycardia who saw Cecilie Kicks NP on 07/21/16 with episodic chest pain. At OV his BP was elevated to 160/88 but he was anxious for the appointment so no med changes were made. Last LDL 02/2015 133, HDL 34. Stress echo 08/12/16 was normal.  He returns for follow-up today. He is feeling better. No further chest pain. He has begun to ease back into his normal level of activity and has not had any further chest or arm discomfort. No SOB. No recent syncope. He does report intermittent spotty BRBPR - has seen GI in the past for hemorrhoid banding and anal fissure. No melena, hematemesis, or large volume losses. Has h/o diarrhea but more recently constipated. Plans to start eating more fiber.   Past Medical History:  Diagnosis Date  . Allergy   . Anal fissure   . Anxiety   . Blood in stool   . Depression   . Hypertension   . Irritable bowel syndrome with diarrhea   . Jaundice, newborn   . Panic disorder   . Seizures (HCC)    febrile  . Sleep apnea     Past Surgical History:  Procedure Laterality Date  . HEMORRHOID BANDING    . WISDOM TOOTH EXTRACTION  1991    Current Medications: Current Outpatient Prescriptions  Medication Sig Dispense Refill  . FLUoxetine (PROZAC) 10 MG capsule TAKE 1 CAPSULE (10 MG TOTAL) BY MOUTH DAILY. 90 capsule 0  . LORazepam (ATIVAN) 1 MG tablet Take 1 mg by mouth every 8 (eight) hours as needed for anxiety.    . nebivolol (BYSTOLIC) 10 MG tablet TAKE 1 TABLET DAILY 90 tablet 3  . nitroGLYCERIN (NITROSTAT) 0.4 MG SL tablet Place 1 tablet (0.4 mg total) under the tongue every 5 (five) minutes as needed for chest pain.  20 tablet 3   No current facility-administered medications for this visit.      Allergies:   Review of patient's allergies indicates no known allergies.   Social History   Social History  . Marital status: Married    Spouse name: N/A  . Number of children: 1  . Years of education: N/A   Occupational History  . Herbalist    Social History Main Topics  . Smoking status: Never Smoker  . Smokeless tobacco: Never Used  . Alcohol use 0.0 oz/week     Comment: 1-5 per week  . Drug use:     Types: Marijuana  . Sexual activity: Not Asked   Other Topics Concern  . None   Social History Narrative  . None     Family History:  The patient's family history includes Colitis in his brother; Colon polyps in his brother and father; Dementia in his mother; Diabetes in his father; Heart attack in his father; Heart disease (age of onset: 19) in his father; Hyperlipidemia in his father; Hypertension in his mother; Skin cancer in his mother; Stroke in his father and mother.   ROS:   Please see the history of present illness.  All other systems are reviewed and otherwise negative.    PHYSICAL EXAM:   VS:  BP  126/90 (BP Location: Right Arm, Patient Position: Sitting, Cuff Size: Large)   Pulse 76   Ht 5' 9"  (1.753 m)   Wt 283 lb 1.9 oz (128.4 kg)   BMI 41.81 kg/m   BMI: Body mass index is 41.81 kg/m. GEN: Well nourished, well developed obese WM, in no acute distress  HEENT: normocephalic, atraumatic Neck: no JVD, carotid bruits, or masses Cardiac: RRR; no murmurs, rubs, or gallops, no edema  Respiratory:  clear to auscultation bilaterally, normal work of breathing GI: soft, nontender, nondistended, + BS MS: no deformity or atrophy  Skin: warm and dry, no rash Neuro:  Alert and Oriented x 3, Strength and sensation are intact, follows commands Psych: euthymic mood, full affect  Wt Readings from Last 3 Encounters:  09/02/16 283 lb 1.9 oz (128.4 kg)  07/21/16 283 lb 9.6 oz  (128.6 kg)  07/17/16 287 lb (130.2 kg)      Studies/Labs Reviewed:   EKG:  EKG was not ordered today.  Recent Labs: 02/10/2016: ALT 23; BUN 22; Creatinine, Ser 1.14; Hemoglobin 15.7; Platelets 172.0; Potassium 4.4; Sodium 143; TSH 1.78   Lipid Panel    Component Value Date/Time   CHOL 195 03/13/2015 0908   TRIG 138.0 03/13/2015 0908   HDL 34.20 (L) 03/13/2015 0908   CHOLHDL 6 03/13/2015 0908   VLDL 27.6 03/13/2015 0908   LDLCALC 133 (H) 03/13/2015 0908    Additional studies/ records that were reviewed today include: Summarized above.    ASSESSMENT & PLAN:   1. Chest pain - resolved. Stress echo normal. Continue lifestyle modification. Observation for warning sx reviewed. 2. HTN - generally controlled. Advised he monitor BP episodically and call doctor if running >509 systolic or >32 diastolic. 3. Elevated cholesterol - further monitoring by primary care. 4. Morbid obesity - we discussed that risk factor modification is the best way at this point to prevent future CV problems.  Disposition: F/u with Dr. Irish Lack PRN. He also mentioned periodic spotty BRBPR - no large volume bleeding or melena reported. Has GI Dr. Hilarie Fredrickson - I advised he contact him to discuss further workup. The patient plans to add more fiber to diet.  Medication Adjustments/Labs and Tests Ordered: Current medicines are reviewed at length with the patient today.  Concerns regarding medicines are outlined above. Medication changes, Labs and Tests ordered today are summarized above and listed in the Patient Instructions accessible in Encounters.   Raechel Ache PA-C  09/02/2016 3:08 PM    [facility] [place], [place], [place]  [address] Phone: [phone]; Fax: [phone]

## 2016-09-02 NOTE — Patient Instructions (Signed)
Medication Instructions:  Your physician recommends that you continue on your current medications as directed. Please refer to the Current Medication list given to you today.   Labwork: NONE ORDERED  Testing/Procedures: NONE ORDERED  Follow-Up: Your physician recommends that you schedule a follow-up appointment in: AS NEEDED   Any Other Special Instructions Will Be Listed Below (If Applicable).  Call if your blood pressure is running higher than 140/90 on a regular basis   If you need a refill on your cardiac medications before your next appointment, please call your pharmacy.

## 2016-09-04 ENCOUNTER — Other Ambulatory Visit: Payer: Self-pay | Admitting: Family Medicine

## 2016-09-04 NOTE — Telephone Encounter (Signed)
Rx refill sent to pharmacy. 

## 2016-09-08 ENCOUNTER — Other Ambulatory Visit: Payer: Self-pay | Admitting: Family Medicine

## 2016-09-08 NOTE — Telephone Encounter (Signed)
Rx refill sent to pharmacy. 

## 2016-10-01 ENCOUNTER — Other Ambulatory Visit: Payer: Self-pay | Admitting: Family Medicine

## 2016-10-01 NOTE — Telephone Encounter (Signed)
Last OV 07-17-2016 Last refill 07-21-2016 #15, 0rf Please advise

## 2016-10-01 NOTE — Telephone Encounter (Signed)
Refill once.  Avoid regular use. 

## 2016-10-02 NOTE — Telephone Encounter (Signed)
Lorazepam 1 mg tab Last refilled 07/21/16 Last OV 07/17/16 Please advise

## 2016-10-04 NOTE — Telephone Encounter (Signed)
This has already been answered.  Not sure why this is coming back.  OK to refill once.  Avoid regular use.

## 2016-10-06 ENCOUNTER — Telehealth: Payer: Self-pay | Admitting: Family Medicine

## 2016-10-06 NOTE — Telephone Encounter (Signed)
Last refill 07/16/2016 #15 Looks like medication was d/c off list but put back on by a different office.  Please advise

## 2016-10-06 NOTE — Telephone Encounter (Signed)
Refill once #20 tablets.  Avoid regular use.

## 2016-10-06 NOTE — Telephone Encounter (Signed)
Patient contacted his pharmacy last week about a refill on Lorazepam, however the pharmacy has not received anything back.  Patient is going out of town and needs to get his refill before leaving.  [facility] [address]

## 2016-10-08 MED ORDER — LORAZEPAM 1 MG PO TABS: 1.0000 mg | ORAL_TABLET | Freq: Three times a day (TID) | ORAL | 0 refills | Status: DC | PRN

## 2016-10-08 NOTE — Telephone Encounter (Signed)
Done

## 2016-10-08 NOTE — Telephone Encounter (Signed)
Verbally called in for patient.

## 2016-10-08 NOTE — Telephone Encounter (Signed)
Please handle this thanks

## 2016-12-06 ENCOUNTER — Other Ambulatory Visit: Payer: Self-pay | Admitting: Family Medicine

## 2017-01-08 ENCOUNTER — Telehealth: Payer: Self-pay | Admitting: Family Medicine

## 2017-01-08 NOTE — Telephone Encounter (Signed)
Patient needs these Rx refilled:  LORazepam (ATIVAN) 1 MG tablet  FLUoxetine (PROZAC) 10 MG capsule 90 day supply  Sent to: [id number] [place], [place] - [phone](Phone) [phone](Fax)

## 2017-01-11 NOTE — Telephone Encounter (Signed)
Refill Prozac for 6 months.  May refill #20 Lorazepam- should only be taking for severe anxiety symptoms.

## 2017-01-11 NOTE — Telephone Encounter (Signed)
Last OV 07-17-2016  Last Lorazepam refill 10-08-2016 #30, 0rf Please advise

## 2017-01-12 MED ORDER — LORAZEPAM 1 MG PO TABS: 1.0000 mg | ORAL_TABLET | Freq: Three times a day (TID) | ORAL | 0 refills | Status: DC | PRN

## 2017-01-12 MED ORDER — FLUOXETINE HCL 10 MG PO CAPS: 10.0000 mg | ORAL_CAPSULE | Freq: Every day | ORAL | 1 refills | Status: DC

## 2017-01-12 NOTE — Telephone Encounter (Signed)
Medications refilled for patient.

## 2017-04-07 ENCOUNTER — Other Ambulatory Visit: Payer: Self-pay | Admitting: Family Medicine

## 2017-04-07 NOTE — Telephone Encounter (Signed)
He should not be taking this.  If he is still having breakthrough anxiety we can increase his Prozac to 20 mg daily.  We need to take the Lorazepam off med list.  We have discussed in past and happy to do so again if needed.

## 2017-04-07 NOTE — Telephone Encounter (Signed)
LORazepam (ATIVAN) 1 MG tablet  last refill 01/12/17.  Last office visit 07/17/16.  Okay to fill?

## 2017-04-08 NOTE — Telephone Encounter (Signed)
I left a message for the pt to return my call.

## 2017-04-08 NOTE — Telephone Encounter (Signed)
Pt returning your call

## 2017-04-08 NOTE — Telephone Encounter (Signed)
I called the pt and informed him of the message below and he  declined increasing the dose of Prozac.  He is aware the Lorazepam was denied and this was removed from his list.

## 2017-04-09 ENCOUNTER — Other Ambulatory Visit: Payer: Self-pay | Admitting: *Deleted

## 2017-04-09 MED ORDER — FLUOXETINE HCL 10 MG PO CAPS: 10.0000 mg | ORAL_CAPSULE | Freq: Every day | ORAL | 1 refills | Status: DC

## 2017-04-13 ENCOUNTER — Encounter: Payer: BLUE CROSS/BLUE SHIELD | Admitting: Family Medicine

## 2017-07-27 ENCOUNTER — Telehealth: Payer: Self-pay | Admitting: Family Medicine

## 2017-07-27 NOTE — Telephone Encounter (Signed)
Yes.  OK to refill.

## 2017-07-27 NOTE — Telephone Encounter (Signed)
Last refill 01/12/17 "Refill Prozac for 6 months.  May refill #20 Lorazepam- should only be taking for severe anxiety symptoms" and last office visit 07/17/16.  Okay to refill?

## 2017-07-27 NOTE — Telephone Encounter (Signed)
Should not be taking the Lorazepam.  If anxiety not controlled with  Fluoxetine set up follow up.

## 2017-07-27 NOTE — Telephone Encounter (Signed)
Spoke with patient and he is going on a flight next week and would like to know if he can have 10 tablets?

## 2017-07-27 NOTE — Telephone Encounter (Signed)
° ° °  Pt call to ask for a refill on Ativan, I am not showing this on pt file.

## 2017-07-28 MED ORDER — LORAZEPAM 1 MG PO TABS: 1.0000 mg | ORAL_TABLET | Freq: Three times a day (TID) | ORAL | 0 refills | Status: DC | PRN

## 2017-07-28 NOTE — Telephone Encounter (Signed)
Rx called in and patient is aware

## 2017-07-28 NOTE — Addendum Note (Signed)
Addended by: Westley Hummer B on: 07/28/2017 10:09 AM   Modules accepted: Orders

## 2017-09-16 ENCOUNTER — Encounter: Payer: Self-pay | Admitting: Family Medicine

## 2017-09-21 ENCOUNTER — Ambulatory Visit (INDEPENDENT_AMBULATORY_CARE_PROVIDER_SITE_OTHER): Payer: BLUE CROSS/BLUE SHIELD | Admitting: Family Medicine

## 2017-09-21 ENCOUNTER — Encounter: Payer: Self-pay | Admitting: Family Medicine

## 2017-09-21 VITALS — BP 124/84 | HR 64 | Temp 98.2°F | Ht 69.5 in | Wt 290.1 lb

## 2017-09-21 DIAGNOSIS — M7711 Lateral epicondylitis, right elbow: Secondary | ICD-10-CM

## 2017-09-21 DIAGNOSIS — I10 Essential (primary) hypertension: Secondary | ICD-10-CM | POA: Diagnosis not present

## 2017-09-21 DIAGNOSIS — J069 Acute upper respiratory infection, unspecified: Secondary | ICD-10-CM | POA: Diagnosis not present

## 2017-09-21 DIAGNOSIS — F410 Panic disorder [episodic paroxysmal anxiety] without agoraphobia: Secondary | ICD-10-CM | POA: Diagnosis not present

## 2017-09-21 MED ORDER — NITROGLYCERIN 0.4 MG SL SUBL: 0.4000 mg | SUBLINGUAL_TABLET | SUBLINGUAL | 0 refills | Status: DC | PRN

## 2017-09-21 MED ORDER — LORAZEPAM 1 MG PO TABS: 1.0000 mg | ORAL_TABLET | Freq: Three times a day (TID) | ORAL | 0 refills | Status: DC | PRN

## 2017-09-21 NOTE — Progress Notes (Signed)
Subjective:     Patient ID: Hector [name], male   DOB: 1971-05-12, 46 y.o.   MRN: [id number]  HPI Patient was initially scheduled for complete physical today. However, he was running late and he also had several acute issues and chronic issues he wished to follow-up so we decided to convert this to evaluation and management visit. He will schedule physical later. We discussed the following issues today  New problem of right elbow pain. At the going on for a few months. Denies any injury. Right-hand dominant. Location is lateral epicondylar region. Symptoms somewhat inconsistent but worse with gripping. Is not tried any icing. No neck pain. No weakness. Pain is relatively mild today but moderate at times  URI symptoms. Onset couple days ago sore throat and nasal congestion. No cough. Increased malaise. No fevers or chills.  History of panic disorder. Takes fluoxetine currently on low-dose of 10 mg daily. Anxiety symptoms generally well-controlled. Occasionally takes lorazepam for severe breakthrough symptoms. Denies any depression symptoms.  Past Medical History:  Diagnosis Date  . Allergy   . Anal fissure   . Anxiety   . Blood in stool   . Depression   . Hypertension   . Irritable bowel syndrome with diarrhea   . Jaundice, newborn   . Panic disorder   . Seizures (HCC)    febrile  . Sleep apnea    Past Surgical History:  Procedure Laterality Date  . [place]    reports that he has never smoked. He has never used smokeless tobacco. He reports that he drinks alcohol. He reports that he uses drugs, including Marijuana. family history includes Colitis in his brother; Colon polyps in his brother and father; Dementia in his mother; Diabetes in his father; Heart attack in his father; Heart disease (age of onset: 39) in his father; Hyperlipidemia in his father; Hypertension in his mother; Skin cancer in his mother; Stroke in his father and  mother. No Known Allergies   Review of Systems  Constitutional: Negative for fatigue.  Eyes: Negative for visual disturbance.  Respiratory: Negative for cough, chest tightness and shortness of breath.   Cardiovascular: Negative for chest pain, palpitations and leg swelling.  Neurological: Negative for dizziness, syncope, weakness, light-headedness and headaches.  Psychiatric/Behavioral: Negative for dysphoric mood. The patient is nervous/anxious.        Objective:   Physical Exam  Constitutional: He is oriented to person, place, and time. He appears well-developed and well-nourished.  HENT:  Right Ear: External ear normal.  Left Ear: External ear normal.  Mouth/Throat: Oropharynx is clear and moist.  Eyes: Pupils are equal, round, and reactive to light.  Neck: Neck supple. No thyromegaly present.  Cardiovascular: Normal rate and regular rhythm.   Pulmonary/Chest: Effort normal and breath sounds normal. No respiratory distress. He has no wheezes. He has no rales.  Musculoskeletal: He exhibits no edema.  Right elbow full range of motion. Minimal tenderness right lateral epicondylar region. No pain with suppination or pronation.  Neurological: He is alert and oriented to person, place, and time.       Assessment:     #1 right lateral epicondylitis  #2 history of panic disorder on fluoxetine  #3 viral URI  #4 hypertension stable and at goal    Plan:     -Strongly encouraged to lose some weight -Regarding URI symptoms no indication for antibiotic. Treat symptomatically. Stay well-hydrated. Tylenol as needed for body aches and  sore throat -Try icing for right lateral elbow pain. Consider tennis elbow strap. Consider steroid injection if not improving over several weeks -Continue fluoxetine for anxiety symptom prevention. Limited refill of lorazepam for severe breakthrough symptoms  [name] MD [facility]

## 2017-09-21 NOTE — Patient Instructions (Signed)
Tennis Elbow Tennis elbow (lateral epicondylitis) is inflammation of the outer tendons of your forearm close to your elbow. Your tendons attach your muscles to your bones. The outer tendons of your forearm are used to extend your wrist, and they attach on the outside part of your elbow. Tennis elbow is often found in people who play tennis, but anyone may get the condition from repeatedly extending the wrist or turning the forearm. What are the causes? This condition is caused by repeatedly extending your wrist and using your hands. It can result from sports or work that requires repetitive forearm movements. Tennis elbow may also be caused by an injury. What increases the risk? You have a higher risk of developing tennis elbow if you play tennis or another racquet sport. You also have a higher risk if you frequently use your hands for work. This condition is also more likely to develop in:  Musicians.  Carpenters, painters, and plumbers.  Cooks.  Cashiers.  People who work in [employer].  Architect workers.  Butchers.  People who use computers.  What are the signs or symptoms? Symptoms of this condition include:  Pain and tenderness in your forearm and the outer part of your elbow. You may only feel the pain when you use your arm, or you may feel it even when you are not using your arm.  A burning feeling that runs from your elbow through your arm.  Weak grip in your hands.  How is this diagnosed? This condition may be diagnosed by medical history and physical exam. You may also have other tests, including:  X-rays.  MRI.  How is this treated? Your health care provider will recommend lifestyle adjustments, such as resting and icing your arm. Treatment may also include:  Medicines for inflammation. This may include shots of cortisone if your pain continues.  Physical therapy. This may include massage or exercises.  An elbow brace.  Surgery may eventually be  recommended if your pain does not go away with treatment. Follow these instructions at home: Activity  Rest your elbow and wrist as directed by your health care provider. Try to avoid any activities that caused the problem until your health care provider says that you can do them again.  If a physical therapist teaches you exercises, do all of them as directed.  If you lift an object, lift it with your palm facing upward. This lowers the stress on your elbow. Lifestyle  If your tennis elbow is caused by sports, check your equipment and make sure that: ? You are using it correctly. ? It is the best fit for you.  If your tennis elbow is caused by work, take breaks frequently, if you are able. Talk with your manager about how to best perform tasks in a way that is safe. ? If your tennis elbow is caused by computer use, talk with your manager about any changes that can be made to your work environment. General instructions  If directed, apply ice to the painful area: ? Put ice in a plastic bag. ? Place a towel between your skin and the bag. ? Leave the ice on for 20 minutes, 2-3 times per day.  Take medicines only as directed by your health care provider.  If you were given a brace, wear it as directed by your health care provider.  Keep all follow-up visits as directed by your health care provider. This is important. Contact a health care provider if:  Your pain does not  get better with treatment.  Your pain gets worse.  You have numbness or weakness in your forearm, hand, or fingers. This information is not intended to replace advice given to you by your health care provider. Make sure you discuss any questions you have with your health care provider. Document Released: 12/14/2005 Document Revised: 08/13/2016 Document Reviewed: 12/10/2014 Elsevier Interactive Patient Education  [employer].

## 2017-10-04 ENCOUNTER — Other Ambulatory Visit: Payer: Self-pay | Admitting: Family Medicine

## 2017-10-05 ENCOUNTER — Other Ambulatory Visit: Payer: Self-pay | Admitting: *Deleted

## 2017-12-22 ENCOUNTER — Telehealth: Payer: Self-pay | Admitting: Family Medicine

## 2017-12-22 NOTE — Telephone Encounter (Signed)
Last OV: 09/21/17 Last Refilled: 09/21/17 #15 tabs.  Rx is for Q8H PRN.   Please advise on refill request.

## 2017-12-22 NOTE — Telephone Encounter (Signed)
Pt is in the office requesting Lorazepam 1 MG will be going out of town 12/23/17 to see ill father (that had triple bypass surgerya) in [place] and would like to have some for his trip will be returning to [place] on 12/30/17.  [facility] [address].

## 2017-12-23 ENCOUNTER — Other Ambulatory Visit: Payer: Self-pay | Admitting: Family Medicine

## 2017-12-23 MED ORDER — LORAZEPAM 1 MG PO TABS: 1.0000 mg | ORAL_TABLET | Freq: Three times a day (TID) | ORAL | 0 refills | Status: DC | PRN

## 2017-12-23 NOTE — Telephone Encounter (Signed)
May refill once.

## 2017-12-23 NOTE — Telephone Encounter (Signed)
Called in medication #15 tabs to pt's pharmacy. A detailed message was left on pts voice mail making him aware of refill.

## 2018-04-04 ENCOUNTER — Other Ambulatory Visit: Payer: Self-pay | Admitting: Family Medicine

## 2018-04-04 NOTE — Telephone Encounter (Signed)
Copied from [place]. Topic: General - Other >> Apr 04, 2018  1:42 PM Darl Householder, RMA wrote: Reason for CRM: Medication refill request for LORazepam (ATIVAN) 1 MG tablet to be [facility] to [facility] [address]

## 2018-04-04 NOTE — Telephone Encounter (Signed)
LOV: 09/21/17 Next OV: 04/13/18  Dr. Elease Hashimoto  [facility] [address]

## 2018-04-06 MED ORDER — LORAZEPAM 1 MG PO TABS: 1.0000 mg | ORAL_TABLET | Freq: Three times a day (TID) | ORAL | 0 refills | Status: DC | PRN

## 2018-04-06 NOTE — Telephone Encounter (Signed)
Call in #30 with no rf

## 2018-04-13 ENCOUNTER — Ambulatory Visit (INDEPENDENT_AMBULATORY_CARE_PROVIDER_SITE_OTHER): Payer: BLUE CROSS/BLUE SHIELD | Admitting: Family Medicine

## 2018-04-13 ENCOUNTER — Encounter: Payer: Self-pay | Admitting: Family Medicine

## 2018-04-13 VITALS — BP 120/84 | HR 62 | Temp 97.7°F | Ht 70.0 in | Wt 299.5 lb

## 2018-04-13 DIAGNOSIS — Z8249 Family history of ischemic heart disease and other diseases of the circulatory system: Secondary | ICD-10-CM

## 2018-04-13 DIAGNOSIS — Z0000 Encounter for general adult medical examination without abnormal findings: Secondary | ICD-10-CM

## 2018-04-13 LAB — CBC WITH DIFFERENTIAL/PLATELET
Basophils Absolute: 0 10*3/uL (ref 0.0–0.1)
Basophils Relative: 0.6 % (ref 0.0–3.0)
EOS PCT: 3.6 % (ref 0.0–5.0)
Eosinophils Absolute: 0.3 10*3/uL (ref 0.0–0.7)
HCT: 44.1 % (ref 39.0–52.0)
Hemoglobin: 15.5 g/dL (ref 13.0–17.0)
LYMPHS ABS: 2 10*3/uL (ref 0.7–4.0)
Lymphocytes Relative: 26.7 % (ref 12.0–46.0)
MCHC: 35 g/dL (ref 30.0–36.0)
MCV: 86 fl (ref 78.0–100.0)
MONO ABS: 0.6 10*3/uL (ref 0.1–1.0)
MONOS PCT: 8 % (ref 3.0–12.0)
NEUTROS ABS: 4.6 10*3/uL (ref 1.4–7.7)
NEUTROS PCT: 61.1 % (ref 43.0–77.0)
PLATELETS: 193 10*3/uL (ref 150.0–400.0)
RBC: 5.13 Mil/uL (ref 4.22–5.81)
RDW: 13 % (ref 11.5–15.5)
WBC: 7.6 10*3/uL (ref 4.0–10.5)

## 2018-04-13 LAB — BASIC METABOLIC PANEL
BUN: 22 mg/dL (ref 6–23)
CALCIUM: 9.2 mg/dL (ref 8.4–10.5)
CHLORIDE: 105 meq/L (ref 96–112)
CO2: 29 meq/L (ref 19–32)
CREATININE: 0.91 mg/dL (ref 0.40–1.50)
GFR: 95.12 mL/min (ref >60.00)
GLUCOSE: 77 mg/dL (ref 70–99)
Potassium: 4.3 mEq/L (ref 3.5–5.1)
Sodium: 142 mEq/L (ref 135–145)

## 2018-04-13 LAB — HEPATIC FUNCTION PANEL
ALBUMIN: 4.1 g/dL (ref 3.5–5.2)
ALT: 19 U/L (ref 0–53)
AST: 12 U/L (ref 0–37)
Alkaline Phosphatase: 69 U/L (ref 39–117)
Bilirubin, Direct: 0.1 mg/dL (ref 0.0–0.3)
Total Bilirubin: 0.4 mg/dL (ref 0.2–1.2)
Total Protein: 6.8 g/dL (ref 6.0–8.3)

## 2018-04-13 LAB — LIPID PANEL
Cholesterol: 186 mg/dL (ref 0–200)
HDL: 33.7 mg/dL — AB (ref >39.00)
LDL Cholesterol: 114 mg/dL — ABNORMAL HIGH (ref 0–99)
NONHDL: 152.58
TRIGLYCERIDES: 195 mg/dL — AB (ref 0.0–149.0)
Total CHOL/HDL Ratio: 6
VLDL: 39 mg/dL (ref 0.0–40.0)

## 2018-04-13 LAB — TSH: TSH: 2.02 u[IU]/mL (ref 0.35–4.50)

## 2018-04-13 MED ORDER — FLUOXETINE HCL 10 MG PO CAPS
10.0000 mg | ORAL_CAPSULE | Freq: Every day | ORAL | 1 refills | Status: DC
Start: 2018-04-13 — End: 2018-06-10

## 2018-04-13 MED ORDER — TRIAMCINOLONE ACETONIDE 0.1 % EX CREA: 1.0000 "application " | TOPICAL_CREAM | Freq: Two times a day (BID) | CUTANEOUS | 2 refills | Status: DC | PRN

## 2018-04-13 MED ORDER — NEBIVOLOL HCL 10 MG PO TABS: ORAL_TABLET | ORAL | 1 refills | Status: DC

## 2018-04-13 NOTE — Progress Notes (Signed)
Cor calciuim Subjective:     Patient ID: Hector [name], male   DOB: September 29, 1971, 47 y.o.   MRN: [id number]  HPI Patient seen for physical exam. He has history of morbid obesity, hyperlipidemia, hypertension, history of panic disorder, and history of recurrent depression. Depression symptoms currently stable. Very sedentary. No regular exercise.   Strong family history of coronary disease in his father in his 79s and mother 6s. Patient had episode of chest pain years ago and had stress echo then which showed no ischemia.   Recurrent issue of pruritic rash lower legs. Went to dermatologist and was told use moisturizer. Tried over-the-counter hydrocortisone without much relief.  Past Medical History:  Diagnosis Date  . Allergy   . Anal fissure   . Anxiety   . Blood in stool   . Depression   . Hypertension   . Irritable bowel syndrome with diarrhea   . Jaundice, newborn   . Panic disorder   . Seizures (HCC)    febrile  . Sleep apnea    Past Surgical History:  Procedure Laterality Date  . [place]    reports that he has never smoked. He has never used smokeless tobacco. He reports that he drinks alcohol. He reports that he has current or past drug history. Drug: Marijuana. family history includes Colitis in his brother; Colon polyps in his brother and father; Dementia in his mother; Diabetes in his father; Heart attack in his father; Heart disease (age of onset: 14) in his father; Hyperlipidemia in his father; Hypertension in his mother; Skin cancer in his mother; Stroke in his father and mother. No Known Allergies     Review of Systems  Constitutional: Positive for fatigue. Negative for activity change, appetite change and fever.  HENT: Negative for congestion, ear pain and trouble swallowing.   Eyes: Negative for pain and visual disturbance.  Respiratory: Negative for cough, shortness of breath and wheezing.   Cardiovascular:  Negative for chest pain and palpitations.  Gastrointestinal: Negative for abdominal distention, abdominal pain, blood in stool, constipation, diarrhea, nausea, rectal pain and vomiting.  Genitourinary: Negative for dysuria, hematuria and testicular pain.  Musculoskeletal: Negative for arthralgias and joint swelling.  Skin: Positive for rash.  Neurological: Negative for dizziness, syncope and headaches.  Hematological: Negative for adenopathy.  Psychiatric/Behavioral: Negative for confusion and dysphoric mood.       Objective:   Physical Exam  Constitutional: He is oriented to person, place, and time. He appears well-developed and well-nourished. No distress.  HENT:  Head: Normocephalic and atraumatic.  Right Ear: External ear normal.  Left Ear: External ear normal.  Mouth/Throat: Oropharynx is clear and moist.  Eyes: Pupils are equal, round, and reactive to light. Conjunctivae and EOM are normal.  Neck: Normal range of motion. Neck supple. No thyromegaly present.  Cardiovascular: Normal rate, regular rhythm and normal heart sounds.  No murmur heard. Pulmonary/Chest: No respiratory distress. He has no wheezes. He has no rales.  Abdominal: Soft. Bowel sounds are normal. He exhibits no distension and no mass. There is no tenderness. There is no rebound and no guarding.  Musculoskeletal: He exhibits no edema.  Lymphadenopathy:    He has no cervical adenopathy.  Neurological: He is alert and oriented to person, place, and time. He displays normal reflexes. No cranial nerve deficit.  Skin: Rash noted.  Patient has some excoriations right lower leg. No saline as changes. Nonspecific dermatitis changes right lower medial  leg  Psychiatric: He has a normal mood and affect.       Assessment:     Complete physical. Patient has chronic issues including morbid obesity, hyperlipidemia, hypertension. Positive family history of CAD  Nonspecific dermatitis lower legs.    Plan:     -Recommend  screening lab work -Strongly advise losing some weight -We discussed possible coronary calcium score to help further risk stratify -Trial of triamcinolone 0.1% cream twice daily as needed for leg rash and avoid scratching as much as possible  [name] MD [facility]

## 2018-04-13 NOTE — Patient Instructions (Signed)
Coronary Calcium Scan A coronary calcium scan is an imaging test used to look for deposits of calcium and other fatty materials (plaques) in the inner lining of the blood vessels of the heart (coronary arteries). These deposits of calcium and plaques can partly clog and narrow the coronary arteries without producing any symptoms or warning signs. This puts a person at risk for a heart attack. This test can detect these deposits before symptoms develop. Tell a health care provider about:  Any allergies you have.  All medicines you are taking, including vitamins, herbs, eye drops, creams, and over-the-counter medicines.  Any problems you or family members have had with anesthetic medicines.  Any blood disorders you have.  Any surgeries you have had.  Any medical conditions you have.  Whether you are pregnant or may be pregnant. What are the risks? Generally, this is a safe procedure. However, problems may occur, including:  Harm to a pregnant woman and her unborn baby. This test involves the use of radiation. Radiation exposure can be dangerous to a pregnant woman and her unborn baby. If you are pregnant, you generally should not have this procedure done.  Slight increase in the risk of cancer. This is because of the radiation involved in the test.  What happens before the procedure? No preparation is needed for this procedure. What happens during the procedure?  You will undress and remove any jewelry around your neck or chest.  You will put on a hospital gown.  Sticky electrodes will be placed on your chest. The electrodes will be connected to an electrocardiogram (ECG) machine to record a tracing of the electrical activity of your heart.  A CT scanner will take pictures of your heart. During this time, you will be asked to lie still and hold your breath for 2-3 seconds while a picture of your heart is being taken. The procedure may vary among health care providers and  hospitals. What happens after the procedure?  You can get dressed.  You can return to your normal activities.  It is up to you to get the results of your test. Ask your health care provider, or the department that is doing the test, when your results will be ready. Summary  A coronary calcium scan is an imaging test used to look for deposits of calcium and other fatty materials (plaques) in the inner lining of the blood vessels of the heart (coronary arteries).  Generally, this is a safe procedure. Tell your health care provider if you are pregnant or may be pregnant.  No preparation is needed for this procedure.  A CT scanner will take pictures of your heart.  You can return to your normal activities after the scan is done. This information is not intended to replace advice given to you by your health care provider. Make sure you discuss any questions you have with your health care provider. Document Released: 06/11/2008 Document Revised: 11/02/2016 Document Reviewed: 11/02/2016 Elsevier Interactive Patient Education  2017 [place].  Try to lose some weight We will set up coronary calcium scan to help with risk stratification.

## 2018-06-10 ENCOUNTER — Other Ambulatory Visit: Payer: Self-pay | Admitting: Family Medicine

## 2018-09-14 ENCOUNTER — Encounter: Payer: Self-pay | Admitting: Family Medicine

## 2018-09-16 ENCOUNTER — Encounter: Payer: Self-pay | Admitting: Family Medicine

## 2018-09-16 ENCOUNTER — Encounter: Payer: Self-pay | Admitting: Neurology

## 2018-09-16 DIAGNOSIS — R51 Headache: Secondary | ICD-10-CM

## 2018-09-16 DIAGNOSIS — R413 Other amnesia: Secondary | ICD-10-CM

## 2018-09-16 DIAGNOSIS — Z8249 Family history of ischemic heart disease and other diseases of the circulatory system: Secondary | ICD-10-CM

## 2018-09-16 DIAGNOSIS — I10 Essential (primary) hypertension: Secondary | ICD-10-CM

## 2018-09-16 DIAGNOSIS — R079 Chest pain, unspecified: Secondary | ICD-10-CM

## 2018-09-16 DIAGNOSIS — R519 Headache, unspecified: Secondary | ICD-10-CM

## 2018-09-16 DIAGNOSIS — R4189 Other symptoms and signs involving cognitive functions and awareness: Secondary | ICD-10-CM

## 2018-09-16 DIAGNOSIS — E7800 Pure hypercholesterolemia, unspecified: Secondary | ICD-10-CM

## 2018-09-16 DIAGNOSIS — R4689 Other symptoms and signs involving appearance and behavior: Secondary | ICD-10-CM

## 2018-10-04 ENCOUNTER — Encounter: Payer: Self-pay | Admitting: *Deleted

## 2018-10-19 ENCOUNTER — Ambulatory Visit: Payer: BLUE CROSS/BLUE SHIELD | Admitting: Physician Assistant

## 2018-10-19 ENCOUNTER — Encounter: Payer: Self-pay | Admitting: Physician Assistant

## 2018-10-19 VITALS — BP 128/98 | HR 61 | Ht 70.0 in | Wt 304.0 lb

## 2018-10-19 DIAGNOSIS — R079 Chest pain, unspecified: Secondary | ICD-10-CM | POA: Diagnosis not present

## 2018-10-19 DIAGNOSIS — R072 Precordial pain: Secondary | ICD-10-CM

## 2018-10-19 DIAGNOSIS — I10 Essential (primary) hypertension: Secondary | ICD-10-CM

## 2018-10-19 DIAGNOSIS — Z8249 Family history of ischemic heart disease and other diseases of the circulatory system: Secondary | ICD-10-CM

## 2018-10-19 DIAGNOSIS — E7800 Pure hypercholesterolemia, unspecified: Secondary | ICD-10-CM

## 2018-10-19 NOTE — Progress Notes (Signed)
Cardiology Office Note    Date:  10/19/2018   ID:  Hector [name], DOB 1971/03/31, MRN [id number]  PCP:  [name], MD  Cardiologist: Larae Grooms, MD EPS: None  No chief complaint on file.   History of Present Illness:  Hector [name] is a 47 y.o. male with history of chest pain, morbid obesity, hypertension, hyperlipidemia, family history of CAD father with MI at 93, mother with stents in her 89s, anxiety depression, sleep apnea, seizures,.  Stress echo 08/12/2016 was normal.  Recommended follow-up with Dr. Irish Lack as needed.   Hector [name] is a 47 y.o. male who is being seen today for the evaluation of chest pain at the request of Burchette, Alinda Sierras, MD.  Try to order a calcium score but was denied because he need to see cardiology.  Has had a few episodes of chest tightness while watching TV.Lasted 10 min and eased with deep breathing. Thought is was heartburn. No regular exercise. 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  Very sedentary.  DOE with little activity-doing laundry or getting dressed.  LDL was 114 triglycerides 195.  PCP 1 to start him on a statin but patient was reluctant.  Drinks sodas every day.   Past Medical History:  Diagnosis Date  . Allergy   . Anal fissure   . Anxiety   . Blood in stool   . Depression   . Hypertension   . Irritable bowel syndrome with diarrhea   . Jaundice, newborn   . Panic disorder   . Seizures (HCC)    febrile  . Sleep apnea     Past Surgical History:  Procedure Laterality Date  . HEMORRHOID BANDING    . WISDOM TOOTH EXTRACTION  1991    Current Medications: Current Meds  Medication Sig  . FLUoxetine (PROZAC) 10 MG capsule TAKE 1 CAPSULE BY MOUTH EVERY DAY  . LORazepam (ATIVAN) 1 MG tablet Take 1 tablet (1 mg total) by mouth every 8 (eight) hours as needed for anxiety.  . nebivolol (BYSTOLIC) 10 MG tablet TAKE 1 TABLET BY MOUTH EVERY DAY NEEDS APPT FOR REFILLS  . nitroGLYCERIN  (NITROSTAT) 0.4 MG SL tablet Place 1 tablet (0.4 mg total) under the tongue every 5 (five) minutes as needed for chest pain.  Marland Kitchen triamcinolone cream (KENALOG) 0.1 % Apply 1 application topically 2 (two) times daily as needed.     Allergies:   Patient has no known allergies.   Social History   Socioeconomic History  . Marital status: Married    Spouse name: Not on file  . Number of children: 1  . Years of education: Not on file  . Highest education level: Not on file  Occupational History  . Occupation: Herbalist  Social Needs  . Financial resource strain: Not on file  . Food insecurity:    Worry: Not on file    Inability: Not on file  . Transportation needs:    Medical: Not on file    Non-medical: Not on file  Tobacco Use  . Smoking status: Never Smoker  . Smokeless tobacco: Never Used  Substance and Sexual Activity  . Alcohol use: Yes    Alcohol/week: 0.0 standard drinks    Comment: 1-5 per week  . Drug use: Yes    Types: Marijuana  . Sexual activity: Not on file  Lifestyle  . Physical activity:    Days per week: Not  on file    Minutes per session: Not on file  . Stress: Not on file  Relationships  . Social connections:    Talks on phone: Not on file    Gets together: Not on file    Attends religious service: Not on file    Active member of club or organization: Not on file    Attends meetings of clubs or organizations: Not on file    Relationship status: Not on file  Other Topics Concern  . Not on file  Social History Narrative  . Not on file     Family History:  The patient's family history includes Basal cell carcinoma in his mother; Colitis in his brother; Colon polyps in his brother and father; Dementia in his mother; Diabetes in his father; Heart attack in his father; Heart disease (age of onset: 34) in his father; Hyperlipidemia in his father; Hypertension in his mother; Skin cancer in his mother; Squamous cell carcinoma in his mother; Stroke in  his father and mother.   ROS:   Please see the history of present illness.    Review of Systems  Constitution: Negative.  HENT: Negative.   Cardiovascular: Positive for chest pain and leg swelling.  Respiratory: Negative.   Endocrine: Negative.   Hematologic/Lymphatic: Negative.   Musculoskeletal: Negative.   Gastrointestinal: Negative.   Genitourinary: Negative.   Neurological: Positive for dizziness.  Psychiatric/Behavioral: Positive for depression. The patient is nervous/anxious.    All other systems reviewed and are negative.   PHYSICAL EXAM:   VS:  BP (!) 128/98 (BP Location: Right Arm, Patient Position: Sitting, Cuff Size: Large)   Pulse 61   Ht 5' 10"  (1.778 m)   Wt (!) 304 lb (137.9 kg)   BMI 43.62 kg/m   Physical Exam  GEN: Obese, in no acute distress  Neck: no JVD, carotid bruits, or masses Cardiac:RRR; no murmurs, rubs, or gallops  Respiratory:  clear to auscultation bilaterally, normal work of breathing GI: soft, nontender, nondistended, + BS Ext: without cyanosis, clubbing, or edema, Good distal pulses bilaterally Neuro:  Alert and Oriented x 3 Psych: euthymic mood, full affect  Wt Readings from Last 3 Encounters:  10/19/18 (!) 304 lb (137.9 kg)  04/13/18 299 lb 8 oz (135.9 kg)  09/21/17 290 lb 1.6 oz (131.6 kg)      Studies/Labs Reviewed:   EKG:  EKG is ordered today.  The ekg ordered today demonstrates normal sinus rhythm, normal EKG  Recent Labs: 04/13/2018: ALT 19; BUN 22; Creatinine, Ser 0.91; Hemoglobin 15.5; Platelets 193.0; Potassium 4.3; Sodium 142; TSH 2.02   Lipid Panel    Component Value Date/Time   CHOL 186 04/13/2018 1413   TRIG 195.0 (H) 04/13/2018 1413   HDL 33.70 (L) 04/13/2018 1413   CHOLHDL 6 04/13/2018 1413   VLDL 39.0 04/13/2018 1413   LDLCALC 114 (H) 04/13/2018 1413    Additional studies/ records that were reviewed today include:  Stress echo 08/12/2016 Study Conclusions   - Stress ECG conclusions: There were no stress  arrhythmias or   conduction abnormalities. The stress ECG was negative for   ischemia. - Staged echo: There was no echocardiographic evidence for   stress-induced ischemia. - Impressions: Normal stress echo   Impressions:   - Normal stress echo      ASSESSMENT:    1. Chest pain, unspecified type   2. Essential hypertension   3. Severe obesity (BMI >= 40) (HCC)   4. Elevated cholesterol   5. Family  history of early CAD   6. Precordial chest pain      PLAN:  In order of problems listed above:  History of chest pain with normal stress echo 2017-now with occasional chest pain.  Multiple CV risk factors with both parents having MIs and stents in the 66s, hypertension, obesity, untreated HLD.  Recommend coronary CTA and calcium store.  Follow-up pending results of above.  Essential hypertension blood pressure well controlled  Severe obesity at least 30 minutes of exercise and weight loss recommended.  Cut sodas of his diet.  hyperlipidemia LDL 114 and triglycerides 195.  Reluctant to take a statin.  Will await coronary CT calcium score.  Family history of early CAD    Medication Adjustments/Labs and Tests Ordered: Current medicines are reviewed at length with the patient today.  Concerns regarding medicines are outlined above.  Medication changes, Labs and Tests ordered today are listed in the Patient Instructions below. There are no Patient Instructions on file for this visit.   Sumner Boast, PA-C  10/19/2018 12:53 PM    [facility] [place], [place], [place]  [address] Phone: [phone]; Fax: [phone]

## 2018-10-19 NOTE — Patient Instructions (Signed)
Medication Instructions:  Your physician recommends that you continue on your current medications as directed. Please refer to the Current Medication list given to you today.  If you need a refill on your cardiac medications before your next appointment, please call your pharmacy.   Lab work: Your physician recommends that you return for lab work Artist) prior to your Cardiac CT  If you have labs (blood work) drawn today and your tests are completely normal, you will receive your results only by: Marland Kitchen MyChart Message (if you have MyChart) OR . A paper copy in the mail If you have any lab test that is abnormal or we need to change your treatment, we will call you to review the results.  Testing/Procedures: Your physician has requested that you have cardiac CT. Cardiac computed tomography (CT) is a painless test that uses an x-ray machine to take clear, detailed pictures of your heart. For further information please visit [email or web address][email or web address]. Please follow instruction sheet as given.   Follow-Up: Based on test results  Any Other Special Instructions Will Be Listed Below (If Applicable). Other recommendations: 1. You should be getting 30 minutes of exercise daily to promote weightloss 2. Decrease the amount of sodas that you are drinking   CARDIAC CT INSTRUCTIONS Please arrive at the [facility] main entrance of [facility] on ____at _______ (30-45 minutes prior to test start time)  [facility] [address] [place], [place] [address] [phone]  Proceed to the [facility] Radiology Department (First Floor).  Please follow these instructions carefully (unless otherwise directed):  Hold all erectile dysfunction medications at least 48 hours prior to test.  On the Night Before the Test: . Be sure to Drink plenty of water. . Do not consume any caffeinated/decaffeinated beverages or chocolate 12 hours prior to your test. . Do not take any antihistamines 12  hours prior to your test.   On the Day of the Test: . Drink plenty of water. Do not drink any water within one hour of the test. . Do not eat any food 4 hours prior to the test. . You may take your regular medications prior to the test.  . Take your bystolic two hours prior to test.    After the Test: . Drink plenty of water. . After receiving IV contrast, you may experience a mild flushed feeling. This is normal. . On occasion, you may experience a mild rash up to 24 hours after the test. This is not dangerous. If this occurs, you can take Benadryl 25 mg and increase your fluid intake. . If you experience trouble breathing, this can be serious. If it is severe call 911 IMMEDIATELY. If it is mild, please call our office. . If you take any of these medications: Glipizide/Metformin, Avandament, Glucavance, please do not take 48 hours after completing test.

## 2018-10-24 DIAGNOSIS — D1801 Hemangioma of skin and subcutaneous tissue: Secondary | ICD-10-CM | POA: Diagnosis not present

## 2018-10-24 DIAGNOSIS — I872 Venous insufficiency (chronic) (peripheral): Secondary | ICD-10-CM | POA: Diagnosis not present

## 2018-10-24 DIAGNOSIS — D225 Melanocytic nevi of trunk: Secondary | ICD-10-CM | POA: Diagnosis not present

## 2018-10-24 DIAGNOSIS — L821 Other seborrheic keratosis: Secondary | ICD-10-CM | POA: Diagnosis not present

## 2018-11-14 ENCOUNTER — Other Ambulatory Visit (INDEPENDENT_AMBULATORY_CARE_PROVIDER_SITE_OTHER): Payer: BLUE CROSS/BLUE SHIELD

## 2018-11-14 ENCOUNTER — Encounter: Payer: Self-pay | Admitting: Neurology

## 2018-11-14 ENCOUNTER — Ambulatory Visit: Payer: BLUE CROSS/BLUE SHIELD | Admitting: Neurology

## 2018-11-14 VITALS — BP 142/90 | HR 60 | Ht 70.0 in | Wt 300.0 lb

## 2018-11-14 DIAGNOSIS — R6889 Other general symptoms and signs: Secondary | ICD-10-CM | POA: Diagnosis not present

## 2018-11-14 DIAGNOSIS — R4789 Other speech disturbances: Secondary | ICD-10-CM | POA: Diagnosis not present

## 2018-11-14 DIAGNOSIS — R413 Other amnesia: Secondary | ICD-10-CM | POA: Diagnosis not present

## 2018-11-14 LAB — VITAMIN B12: Vitamin B-12: 580 pg/mL (ref 211–911)

## 2018-11-14 NOTE — Patient Instructions (Signed)
We will check B12 Further recommendations pending results.  If B12 unremarkable, we will pursue neurocognitive testing at [place].
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
## 2018-11-15 ENCOUNTER — Telehealth: Payer: Self-pay | Admitting: *Deleted

## 2018-11-15 ENCOUNTER — Encounter: Payer: Self-pay | Admitting: *Deleted

## 2018-11-15 NOTE — Telephone Encounter (Signed)
-----   Message from Pieter Partridge, DO sent at 11/15/2018  6:27 AM EST ----- B12 is normal

## 2018-11-15 NOTE — Telephone Encounter (Signed)
Results sent via My Chart.

## 2018-12-02 ENCOUNTER — Other Ambulatory Visit: Payer: Self-pay | Admitting: Family Medicine

## 2018-12-14 ENCOUNTER — Ambulatory Visit (HOSPITAL_COMMUNITY)
Admission: RE | Admit: 2018-12-14 | Discharge: 2018-12-14 | Disposition: A | Discharge status: Home / Self Care | Payer: BLUE CROSS/BLUE SHIELD | Source: Ambulatory Visit | Attending: Physician Assistant | Admitting: Physician Assistant

## 2018-12-14 ENCOUNTER — Ambulatory Visit (HOSPITAL_COMMUNITY): Admission: RE | Admit: 2018-12-14 | Payer: BLUE CROSS/BLUE SHIELD | Source: Ambulatory Visit

## 2018-12-14 DIAGNOSIS — R072 Precordial pain: Secondary | ICD-10-CM | POA: Insufficient documentation

## 2018-12-14 DIAGNOSIS — E7800 Pure hypercholesterolemia, unspecified: Secondary | ICD-10-CM | POA: Insufficient documentation

## 2018-12-14 DIAGNOSIS — I10 Essential (primary) hypertension: Secondary | ICD-10-CM | POA: Diagnosis not present

## 2018-12-14 DIAGNOSIS — Z8249 Family history of ischemic heart disease and other diseases of the circulatory system: Secondary | ICD-10-CM | POA: Diagnosis not present

## 2018-12-14 MED ORDER — NITROGLYCERIN 0.4 MG SL SUBL
SUBLINGUAL_TABLET | SUBLINGUAL | Status: AC
  Filled 2018-12-14: qty 2

## 2018-12-14 MED ORDER — IOPAMIDOL (ISOVUE-370) INJECTION 76%
100.0000 mL | Freq: Once | INTRAVENOUS | Status: AC | PRN
  Administered 2018-12-14: 100 mL via INTRAVENOUS

## 2018-12-14 MED ORDER — NITROGLYCERIN 0.4 MG SL SUBL
0.8000 mg | SUBLINGUAL_TABLET | Freq: Once | SUBLINGUAL | Status: AC
  Administered 2018-12-14: 0.8 mg via SUBLINGUAL
  Filled 2018-12-14: qty 25

## 2018-12-15 ENCOUNTER — Telehealth: Payer: Self-pay

## 2018-12-15 DIAGNOSIS — I2510 Atherosclerotic heart disease of native coronary artery without angina pectoris: Secondary | ICD-10-CM

## 2018-12-15 MED ORDER — ATORVASTATIN CALCIUM 40 MG PO TABS: 40.0000 mg | ORAL_TABLET | Freq: Every day | ORAL | 3 refills | Status: DC

## 2018-12-15 NOTE — Telephone Encounter (Signed)
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  Would recommend lipitor 40 mg once daily to help reduce progression of this. Repeat lipids and lft's in 3 months

## 2018-12-15 NOTE — Telephone Encounter (Signed)
The patient has been notified of the result and recommendations to start lipitor 40 mg QD. Patient will have fasting lipids and lfts on 03/17/19. P verbalized understanding to all instruction and thanked me for the call. Rx sent to preferred pharmacy.

## 2018-12-22 ENCOUNTER — Ambulatory Visit: Payer: BLUE CROSS/BLUE SHIELD | Admitting: Family Medicine

## 2018-12-22 ENCOUNTER — Encounter: Payer: Self-pay | Admitting: Family Medicine

## 2018-12-22 VITALS — BP 150/88 | HR 66 | Temp 98.6°F | Ht 70.0 in | Wt 298.0 lb

## 2018-12-22 DIAGNOSIS — J029 Acute pharyngitis, unspecified: Secondary | ICD-10-CM

## 2018-12-22 DIAGNOSIS — H66002 Acute suppurative otitis media without spontaneous rupture of ear drum, left ear: Secondary | ICD-10-CM

## 2018-12-22 LAB — POCT RAPID STREP A (OFFICE): Rapid Strep A Screen: NEGATIVE

## 2018-12-22 MED ORDER — AMOXICILLIN-POT CLAVULANATE 875-125 MG PO TABS: 1.0000 | ORAL_TABLET | Freq: Two times a day (BID) | ORAL | 0 refills | Status: DC

## 2018-12-22 NOTE — Progress Notes (Signed)
Patient ID: Hector [name], male   DOB: November 26, 1971, 47 y.o.   MRN: [id number]  PCP: Hector Post, MD  Subjective:  Hector [name] is a 47 y.o. year old very pleasant male patient who presents with  symptoms including nasal congestion, sore throat, ear pain, intermittent cough that is minimally productive of yellow/green sputum -started: 2 weeks ago, symptoms are not improivng -previous treatments: Ibuprofen,  vitamin C have provided limited benefit -sick contacts/travel/risks: denies flu exposure. Daughter who is 64 yo has strep -Hx of: GERD No recent antibiotic use. No influenza vaccine this season He is not a smoker  ROS-denies fever, SOB, NVD, tooth pain  Pertinent Past Medical History- HTN  Medications- reviewed  Current Outpatient Medications  Medication Sig Dispense Refill  . atorvastatin (LIPITOR) 40 MG tablet Take 1 tablet (40 mg total) by mouth daily. 90 tablet 3  . FLUoxetine (PROZAC) 10 MG capsule TAKE 1 CAPSULE BY MOUTH EVERY DAY 90 capsule 1  . LORazepam (ATIVAN) 1 MG tablet Take 1 tablet (1 mg total) by mouth every 8 (eight) hours as needed for anxiety. 30 tablet 0  . nebivolol (BYSTOLIC) 10 MG tablet TAKE 1 TABLET BY MOUTH ONCE DAILY. NEED APPT FOR REFILLS 90 tablet 1  . nitroGLYCERIN (NITROSTAT) 0.4 MG SL tablet Place 1 tablet (0.4 mg total) under the tongue every 5 (five) minutes as needed for chest pain. 20 tablet 0  . triamcinolone cream (KENALOG) 0.1 % Apply 1 application topically 2 (two) times daily as needed. 30 g 2   No current facility-administered medications for this visit.     Objective: BP (!) 150/88 (BP Location: Left Arm, Patient Position: Sitting, Cuff Size: Large)   Pulse 66   Temp 98.6 F (37 C) (Oral)   Ht 5' 10"  (1.778 m)   Wt 298 lb (135.2 kg)   SpO2 96%   BMI 42.76 kg/m  Gen: NAD, resting comfortably, obese HEENT: Turbinates erythematous, Right TM normal, Left TM with erythema and mild bulging present, pharynx mildly erythematous with no   exudate or edema, no sinus tenderness CV: RRR no murmurs rubs or gallops Lungs: CTAB no crackles, wheeze, rhonchi Ext: no edema Skin: warm, dry, no rash Neuro: grossly normal, moves all extremities  Assessment/Plan: 1. Acute suppurative otitis media of left ear without spontaneous rupture of tympanic membrane, recurrence not specified Exam and history are most consistent with AOM. We discussed treatment options and will provide Augmentin x 7 days for symptoms. He can also use a probiotic during treatment.  Advised patient on supportive measures:  Get rest, drink plenty of fluids, and use tylenol or ibuprofen as needed for pain. Follow up if fever >101, if symptoms worsen or if symptoms are not improved in 3 days. Patient verbalizes understanding.   - amoxicillin-clavulanate (AUGMENTIN) 875-125 MG tablet; Take 1 tablet by mouth 2 (two) times daily.  Dispense: 14 tablet; Refill: 0  2. Sore throat Rapid strep negative today. Advised supportive measures above and use of warm salt water gargles for symptoms.   - POCT rapid strep A  Finally, we reviewed reasons to return to care including if symptoms worsen or persist or new concerns arise- once again particularly shortness of breath or fever.    Laurita Quint, FNP

## 2018-12-22 NOTE — Patient Instructions (Signed)
Please take medication with food as directed.   Please drink plenty of water so that your urine is pale yellow or clear. Also, get plenty of rest, use tylenol or ibuprofen as needed for discomfort and follow up if symptoms do not improve in 3 to 4 days, worsen, or you develop a fever >101.    Otitis Media, Adult  Otitis media means that the middle ear is red and swollen (inflamed) and full of fluid. The condition usually goes away on its own. Follow these instructions at home:  Take over-the-counter and prescription medicines only as told by your doctor.  If you were prescribed an antibiotic medicine, take it as told by your doctor. Do not stop taking the antibiotic even if you start to feel better.  Keep all follow-up visits as told by your doctor. This is important. Contact a doctor if:  You have bleeding from your nose.  There is a lump on your neck.  You are not getting better in 5 days.  You feel worse instead of better. Get help right away if:  You have pain that is not helped with medicine.  You have swelling, redness, or pain around your ear.  You get a stiff neck.  You cannot move part of your face (paralyzed).  You notice that the bone behind your ear hurts when you touch it.  You get a very bad headache. Summary  Otitis media means that the middle ear is red, swollen, and full of fluid.  This condition usually goes away on its own. In some cases, treatment may be needed.  If you were prescribed an antibiotic medicine, take it as told by your doctor. This information is not intended to replace advice given to you by your health care provider. Make sure you discuss any questions you have with your health care provider. Document Released: 06/01/2008 Document Revised: 01/04/2017 Document Reviewed: 01/04/2017 Elsevier Interactive Patient Education  2019 [employer].

## 2019-01-04 ENCOUNTER — Encounter: Payer: Self-pay | Admitting: Family Medicine

## 2019-01-09 ENCOUNTER — Encounter: Payer: Self-pay | Admitting: Family Medicine

## 2019-03-16 ENCOUNTER — Telehealth: Payer: Self-pay | Admitting: Internal Medicine

## 2019-03-16 NOTE — Telephone Encounter (Signed)
See duplicate phone note re C19. Encounter was for lab only, will make level 2. Pt should also have routine f/u arranged in 11/2018 at that time. Dayna Dunn PA-C

## 2019-03-16 NOTE — Telephone Encounter (Signed)
Left msg    Cancelling appt for labs tomorrow due to corona virus outbreak   WIll call back at end of April to reschedule when viral situation improves

## 2019-03-17 ENCOUNTER — Other Ambulatory Visit: Payer: BLUE CROSS/BLUE SHIELD

## 2019-04-05 NOTE — Telephone Encounter (Signed)
I will route to covering nurse/cma .

## 2019-04-05 NOTE — Telephone Encounter (Signed)
I will remove from covid cancel pool.

## 2019-04-05 NOTE — Telephone Encounter (Signed)
Left message for the pt to call the office to reschedule his fasting lipid and liver panel to be done in 07/2019.

## 2019-05-05 DIAGNOSIS — J209 Acute bronchitis, unspecified: Secondary | ICD-10-CM | POA: Diagnosis not present

## 2019-05-11 NOTE — Telephone Encounter (Signed)
My Chart msg sent for patient to call and arrange fasting labs and ov appt in August

## 2019-06-03 ENCOUNTER — Other Ambulatory Visit: Payer: Self-pay | Admitting: Family Medicine

## 2019-06-04 DIAGNOSIS — J209 Acute bronchitis, unspecified: Secondary | ICD-10-CM | POA: Diagnosis not present

## 2019-06-05 ENCOUNTER — Other Ambulatory Visit: Payer: Self-pay | Admitting: Family Medicine

## 2019-06-05 NOTE — Telephone Encounter (Signed)
Not seen by me since 4/19.  Set up Doxy.

## 2019-06-05 NOTE — Telephone Encounter (Signed)
Pt stated that he thinks he needs to have fasting labs done in order to have refill for atorvastatin (LIPITOR) 40 MG tablet Requesting lab orders. Please advise.

## 2019-06-06 ENCOUNTER — Ambulatory Visit (INDEPENDENT_AMBULATORY_CARE_PROVIDER_SITE_OTHER): Payer: BC Managed Care – PPO | Admitting: Family Medicine

## 2019-06-06 ENCOUNTER — Other Ambulatory Visit: Payer: Self-pay

## 2019-06-06 ENCOUNTER — Encounter: Payer: Self-pay | Admitting: Family Medicine

## 2019-06-06 DIAGNOSIS — F329 Major depressive disorder, single episode, unspecified: Secondary | ICD-10-CM | POA: Diagnosis not present

## 2019-06-06 DIAGNOSIS — I10 Essential (primary) hypertension: Secondary | ICD-10-CM | POA: Diagnosis not present

## 2019-06-06 DIAGNOSIS — F32A Depression, unspecified: Secondary | ICD-10-CM

## 2019-06-06 DIAGNOSIS — E7800 Pure hypercholesterolemia, unspecified: Secondary | ICD-10-CM | POA: Diagnosis not present

## 2019-06-06 DIAGNOSIS — F410 Panic disorder [episodic paroxysmal anxiety] without agoraphobia: Secondary | ICD-10-CM | POA: Diagnosis not present

## 2019-06-06 MED ORDER — NEBIVOLOL HCL 10 MG PO TABS: ORAL_TABLET | ORAL | 3 refills | Status: DC

## 2019-06-06 MED ORDER — LORAZEPAM 1 MG PO TABS: 1.0000 mg | ORAL_TABLET | Freq: Three times a day (TID) | ORAL | 0 refills | Status: DC | PRN

## 2019-06-06 MED ORDER — FLUOXETINE HCL 10 MG PO CAPS: ORAL_CAPSULE | ORAL | 3 refills | Status: DC

## 2019-06-06 NOTE — Telephone Encounter (Signed)
Patient scheduled for Doxy at 4:30PM today

## 2019-06-06 NOTE — Progress Notes (Signed)
Patient ID: Hector [name], male   DOB: 02/24/1971, 48 y.o.   MRN: [id number]  This visit type was conducted due to national recommendations for restrictions regarding the COVID-19 pandemic in an effort to limit this patient's exposure and mitigate transmission in our community.   Virtual Visit via Video Note  I connected with [name] on 06/06/19 at  4:30 PM EDT by a video enabled telemedicine application and verified that I am speaking with the correct person using two identifiers.  Location patient: home Location provider:work or home office Persons participating in the virtual visit: patient, provider  I discussed the limitations of evaluation and management by telemedicine and the availability of in person appointments. The patient expressed understanding and agreed to proceed.   HPI:  Patient needing refill of several medications.  He is on Bystolic for hypertension.  Blood pressures been stable.  Denies any recent chest pain or palpitations.  He has strong family history of premature CAD.  He had stress echo last year which was normal.  He has hyperlipidemia and now takes Lipitor.  He is due for labs and has labs scheduled for next week for lipid and hepatic panel.  He has history of recurrent depression and also probable panic disorder currently stable on fluoxetine.  He infrequently takes lorazepam for severe anxiety symptoms.  Needs refills of both.  He relates recent respiratory infection.  He has been to urgent care twice.  He is currently on his second course of prednisone.  Had recent chest x-ray which showed no pneumonia.  No fever.  No increased dyspnea.  Prednisone helped his wheezing and cough the first go around which was a couple weeks ago.  He is a non-smoker    ROS: See pertinent positives and negatives per HPI.  Past Medical History:  Diagnosis Date  . Allergy   . Anal fissure   . Anxiety   . Blood in stool   . Depression   . Hypertension   . Irritable bowel  syndrome with diarrhea   . Jaundice, newborn   . Panic disorder   . Seizures (HCC)    febrile  . Sleep apnea     Past Surgical History:  Procedure Laterality Date  . HEMORRHOID BANDING    . WISDOM TOOTH EXTRACTION  1991    Family History  Problem Relation Age of Onset  . Dementia Mother   . Stroke Mother   . Hypertension Mother   . Skin cancer Mother   . Basal cell carcinoma Mother   . Squamous cell carcinoma Mother   . Diabetes Father   . Hyperlipidemia Father   . Stroke Father   . Heart disease Father 54  . Colon polyps Father   . Heart attack Father   . Colitis Brother   . Colon polyps Brother     SOCIAL HX: Married.  Non-smoker   Current Outpatient Medications:  .  amoxicillin-clavulanate (AUGMENTIN) 875-125 MG tablet, Take 1 tablet by mouth 2 (two) times daily., Disp: 14 tablet, Rfl: 0 .  atorvastatin (LIPITOR) 40 MG tablet, Take 1 tablet (40 mg total) by mouth daily., Disp: 90 tablet, Rfl: 3 .  FLUoxetine (PROZAC) 10 MG capsule, TAKE 1 CAPSULE BY MOUTH EVERY DAY, Disp: 90 capsule, Rfl: 3 .  LORazepam (ATIVAN) 1 MG tablet, Take 1 tablet (1 mg total) by mouth every 8 (eight) hours as needed for anxiety., Disp: 30 tablet, Rfl: 0 .  nebivolol (BYSTOLIC) 10 MG tablet, TAKE 1 TABLET BY MOUTH  ONCE DAILY., Disp: 90 tablet, Rfl: 3 .  nitroGLYCERIN (NITROSTAT) 0.4 MG SL tablet, Place 1 tablet (0.4 mg total) under the tongue every 5 (five) minutes as needed for chest pain., Disp: 20 tablet, Rfl: 0 .  triamcinolone cream (KENALOG) 0.1 %, Apply 1 application topically 2 (two) times daily as needed., Disp: 30 g, Rfl: 2  EXAM:  VITALS per patient if applicable:  GENERAL: alert, oriented, appears well and in no acute distress  HEENT: atraumatic, conjunttiva clear, no obvious abnormalities on inspection of external nose and ears  NECK: normal movements of the head and neck  LUNGS: on inspection no signs of respiratory distress, breathing rate appears normal, no obvious  gross SOB, gasping or wheezing  CV: no obvious cyanosis  MS: moves all visible extremities without noticeable abnormality  PSYCH/NEURO: pleasant and cooperative, no obvious depression or anxiety, speech and thought processing grossly intact  ASSESSMENT AND PLAN:  Discussed the following assessment and plan:  #1 hypertension-stable -Refilled Bystolic for 1 year  #2 history of recurrent depression stable on fluoxetine -Refilled fluoxetine for 1 year  #3 hyperlipidemia.  Treated now with Lipitor -Has future lab order for lipids and hepatic next week  Recommend consider complete physical at some point later this year    I discussed the assessment and treatment plan with the patient. The patient was provided an opportunity to ask questions and all were answered. The patient agreed with the plan and demonstrated an understanding of the instructions.   The patient was advised to call back or seek an in-person evaluation if the symptoms worsen or if the condition fails to improve as anticipated.   Carolann Littler, MD

## 2019-06-12 ENCOUNTER — Other Ambulatory Visit (INDEPENDENT_AMBULATORY_CARE_PROVIDER_SITE_OTHER): Payer: BC Managed Care – PPO

## 2019-06-12 ENCOUNTER — Other Ambulatory Visit: Payer: Self-pay

## 2019-06-12 DIAGNOSIS — I10 Essential (primary) hypertension: Secondary | ICD-10-CM | POA: Diagnosis not present

## 2019-06-12 DIAGNOSIS — E7800 Pure hypercholesterolemia, unspecified: Secondary | ICD-10-CM | POA: Diagnosis not present

## 2019-06-12 DIAGNOSIS — Z8249 Family history of ischemic heart disease and other diseases of the circulatory system: Secondary | ICD-10-CM

## 2019-06-12 DIAGNOSIS — R079 Chest pain, unspecified: Secondary | ICD-10-CM | POA: Diagnosis not present

## 2019-06-12 DIAGNOSIS — R072 Precordial pain: Secondary | ICD-10-CM

## 2019-06-12 DIAGNOSIS — I2510 Atherosclerotic heart disease of native coronary artery without angina pectoris: Secondary | ICD-10-CM | POA: Diagnosis not present

## 2019-06-12 LAB — HEPATIC FUNCTION PANEL
ALT: 21 U/L (ref 0–53)
AST: 9 U/L (ref 0–37)
Albumin: 3.9 g/dL (ref 3.5–5.2)
Alkaline Phosphatase: 75 U/L (ref 39–117)
Bilirubin, Direct: 0.2 mg/dL (ref 0.0–0.3)
Total Bilirubin: 0.9 mg/dL (ref 0.2–1.2)
Total Protein: 6.3 g/dL (ref 6.0–8.3)

## 2019-06-12 LAB — LIPID PANEL
Cholesterol: 103 mg/dL (ref 0–200)
HDL: 37.4 mg/dL — ABNORMAL LOW (ref >39.00)
LDL Cholesterol: 54 mg/dL (ref 0–99)
NonHDL: 65.79
Total CHOL/HDL Ratio: 3
Triglycerides: 61 mg/dL (ref 0.0–149.0)
VLDL: 12.2 mg/dL (ref 0.0–40.0)

## 2019-06-12 LAB — BASIC METABOLIC PANEL
BUN: 22 mg/dL (ref 6–23)
CO2: 25 mEq/L (ref 19–32)
Calcium: 8.7 mg/dL (ref 8.4–10.5)
Chloride: 105 mEq/L (ref 96–112)
Creatinine, Ser: 0.82 mg/dL (ref 0.40–1.50)
GFR: 100.42 mL/min (ref >60.00)
Glucose, Bld: 89 mg/dL (ref 70–99)
Potassium: 3.8 mEq/L (ref 3.5–5.1)
Sodium: 139 mEq/L (ref 135–145)

## 2019-06-12 NOTE — Addendum Note (Signed)
Addended by: Gwynne Edinger on: 06/12/2019 10:59 AM   Modules accepted: Orders

## 2019-06-19 ENCOUNTER — Encounter: Payer: Self-pay | Admitting: Family Medicine

## 2019-06-20 ENCOUNTER — Other Ambulatory Visit: Payer: Self-pay

## 2019-06-20 MED ORDER — QVAR REDIHALER 80 MCG/ACT IN AERB: 2.0000 | INHALATION_SPRAY | Freq: Two times a day (BID) | RESPIRATORY_TRACT | 0 refills | Status: DC

## 2019-07-06 ENCOUNTER — Other Ambulatory Visit: Payer: Self-pay | Admitting: Family Medicine

## 2019-07-07 MED ORDER — QVAR REDIHALER 80 MCG/ACT IN AERB: 2.0000 | INHALATION_SPRAY | Freq: Two times a day (BID) | RESPIRATORY_TRACT | 0 refills | Status: DC

## 2019-07-12 ENCOUNTER — Encounter: Payer: Self-pay | Admitting: Physician Assistant

## 2019-07-12 ENCOUNTER — Other Ambulatory Visit: Payer: Self-pay

## 2019-07-12 ENCOUNTER — Telehealth (INDEPENDENT_AMBULATORY_CARE_PROVIDER_SITE_OTHER): Payer: BC Managed Care – PPO | Admitting: Physician Assistant

## 2019-07-12 VITALS — Ht 70.0 in | Wt 286.0 lb

## 2019-07-12 DIAGNOSIS — E785 Hyperlipidemia, unspecified: Secondary | ICD-10-CM

## 2019-07-12 DIAGNOSIS — R079 Chest pain, unspecified: Secondary | ICD-10-CM | POA: Diagnosis not present

## 2019-07-12 DIAGNOSIS — I25119 Atherosclerotic heart disease of native coronary artery with unspecified angina pectoris: Secondary | ICD-10-CM | POA: Diagnosis not present

## 2019-07-12 NOTE — Progress Notes (Signed)
Virtual Visit via Video Note   This visit type was conducted due to national recommendations for restrictions regarding the COVID-19 Pandemic (e.g. social distancing) in an effort to limit this patient's exposure and mitigate transmission in our community.  Due to his co-morbid illnesses, this patient is at least at moderate risk for complications without adequate follow up.  This format is felt to be most appropriate for this patient at this time.  All issues noted in this document were discussed and addressed.  A limited physical exam was performed with this format.  Please refer to the patient's chart for his consent to telehealth for [facility].   Date:  07/12/2019   ID:  Hector [name], DOB 1971-06-25, MRN [id number]  Patient Location: Home Provider Location: Home  PCP:  Hector Post, MD  Cardiologist:  Hector Grooms, MD  Electrophysiologist:  None   Evaluation Performed:  Follow-Up Visit  Chief Complaint:  Chest pain  History of Present Illness:    Hector [name] is a 48 y.o. male with  with history of chest pain, morbid obesity, hypertension, hyperlipidemia, family history of CAD father with MI at 13, mother with stents in her 81s, anxiety depression, sleep apnea, seizures,.  Stress echo 08/12/2016 was normal.   I saw the patient 09/2018 for chest tightness while watching TV and eased with deep breathing.  He is very sedentary.  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  Patient added onto my schedule because of multiple questions surrounding his Coronary CT.All questions answered. Had an episode of squeezing/pressure in the center of his chest that felt like really bad heart burn while laying in bed and eased in 3 min. Just stayed in bed and did some deep breathing. Under a lot of stress at home and work. Not exercising. Trying to lose weight-trying to eliminate soda.  He knows he has to lose weight.    The patient does not have symptoms concerning for COVID-19 infection (fever, chills, cough, or new shortness of breath).    Past Medical History:  Diagnosis Date  . Allergy   . Anal fissure   . Anxiety   . Blood in stool   . Depression   . Hypertension   . Irritable bowel syndrome with diarrhea   . Jaundice, newborn   . Panic disorder   . Seizures (HCC)    febrile  . Sleep apnea    Past Surgical History:  Procedure Laterality Date  . HEMORRHOID BANDING    . WISDOM TOOTH EXTRACTION  1991     Current Meds  Medication Sig  . atorvastatin (LIPITOR) 40 MG tablet Take 1 tablet (40 mg total) by mouth daily.  . beclomethasone (QVAR REDIHALER) 80 MCG/ACT inhaler Inhale 2 puffs into the lungs 2 (two) times daily. Rinse mouth out after use.  Marland Kitchen FLUoxetine (PROZAC) 10 MG capsule TAKE 1 CAPSULE BY MOUTH EVERY DAY  . LORazepam (ATIVAN) 1 MG tablet Take 1 tablet (1 mg total) by mouth every 8 (eight) hours as needed for anxiety.  . nebivolol (BYSTOLIC) 10 MG tablet TAKE 1 TABLET BY MOUTH ONCE DAILY.  . nitroGLYCERIN (NITROSTAT) 0.4 MG SL tablet Place 1 tablet (0.4 mg total) under the tongue every 5 (five) minutes as needed for chest pain.     Allergies:   Patient has no known allergies.   Social History   Tobacco Use  .  Smoking status: Never Smoker  . Smokeless tobacco: Never Used  Substance Use Topics  . Alcohol use: Yes    Alcohol/week: 0.0 standard drinks    Comment: 1-5 per week  . Drug use: Yes    Types: Marijuana     Family Hx: The patient's family history includes Basal cell carcinoma in his mother; Colitis in his brother; Colon polyps in his brother and father; Dementia in his mother; Diabetes in his father; Heart attack in his father; Heart disease (age of onset: 67) in his father; Hyperlipidemia in his father; Hypertension in his mother; Skin cancer in his mother; Squamous cell carcinoma in his mother; Stroke in his father and mother.   ROS:   Please see the history of present illness.      All other systems reviewed and are negative.   Prior CV studies:   The following studies were reviewed today: Coronary CT 11/2018  FINDINGS: Non-cardiac: See separate report from [facility]. No significant findings on limited lung and soft tissue windows.   Calcium score: Calcium noted isolated to proximal LAD   Coronary Arteries: Right dominant with no anomalies   LM: Normal   LAD: Less than 50% tubular calcific stenosis   D1: Normal   D2: Normal   Circumflex: Normal   OM1: Normal   OM2: Normal   OM3: Normal   RCA: Normal   PDA: Normal   PLA:  Normal   IMPRESSION: 1.  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  Normal aortic root 3.2 cm   3. Calcium score 70 isolated to proximal LAD this is 8 st percentile for age and sex   Hector [name]     Electronically Signed   By: Hector [name] M.D.   On: 12/14/2018 16:08     Labs/Other Tests and Data Reviewed:    EKG:  No ECG reviewed.  Recent Labs: 06/12/2019: ALT 21; BUN 22; Creatinine, Ser 0.82; Potassium 3.8; Sodium 139   Recent Lipid Panel Lab Results  Component Value Date/Time   CHOL 103 06/12/2019 10:59 AM   TRIG 61.0 06/12/2019 10:59 AM   HDL 37.40 (L) 06/12/2019 10:59 AM   CHOLHDL 3 06/12/2019 10:59 AM   LDLCALC 54 06/12/2019 10:59 AM    Wt Readings from Last 3 Encounters:  07/12/19 286 lb (129.7 kg)  12/22/18 298 lb (135.2 kg)  11/14/18 300 lb (136.1 kg)     Objective:    Vital Signs:  Ht 5' 10"  (1.778 m)   Wt 286 lb (129.7 kg)   BMI 41.04 kg/m    VITAL SIGNS:  reviewed GEN:  no acute distress RESPIRATORY:  normal respiratory effort, symmetric expansion CARDIOVASCULAR:  no peripheral edema  ASSESSMENT & PLAN:    1. History of Chest pain with Calcium score 70 and less than 50% LAD. Went over CT in detail.Had 2 episodes of chest pain in the past 2 months while lying down.  Recommend baby ASA, continue lipitor, Has  NTG to use if needed. Also diagnosed with asthma and GI problems. diet and exercise program. 2. Obesity-will refer to weight loss center 3. Hyperlipidemia on lipitor LDL 54 06/12/19  COVID-19 Education: The signs and symptoms of COVID-19 were discussed with the patient and how to seek care for testing (follow up with PCP or arrange E-visit).   The importance of social distancing was discussed today.  Time:   Today, I have spent 19:25 minutes with the patient with telehealth technology discussing the above problems.  Medication Adjustments/Labs and Tests Ordered: Current medicines are reviewed at length with the patient today.  Concerns regarding medicines are outlined above.   Tests Ordered: No orders of the defined types were placed in this encounter.   Medication Changes: No orders of the defined types were placed in this encounter.   Follow Up:  In Person in 3 month(s)  Dr. Irish Lack  Signed, Ermalinda Barrios, PA-C  07/12/2019 2:39 PM    [place]

## 2019-07-12 NOTE — Patient Instructions (Addendum)
Medication Instructions:  Your physician recommends that you continue on your current medications as directed. Please refer to the Current Medication list given to you today.  If you need a refill on your cardiac medications before your next appointment, please call your pharmacy.   Lab work: None Ordered  If you have labs (blood work) drawn today and your tests are completely normal, you will receive your results only by: Marland Kitchen MyChart Message (if you have MyChart) OR . A paper copy in the mail If you have any lab test that is abnormal or we need to change your treatment, we will call you to review the results.  Testing/Procedures: None ordered  Follow-Up:  Follow up with Dr. Irish Lack on 10/16/19 at 10:00 AM  You have been referred to Dr. Adair Patter for weight loss management   Any Other Special Instructions Will Be Listed Below (If Applicable).

## 2019-09-22 ENCOUNTER — Ambulatory Visit: Payer: BC Managed Care – PPO | Admitting: Family Medicine

## 2019-09-22 ENCOUNTER — Other Ambulatory Visit: Payer: Self-pay

## 2019-09-22 ENCOUNTER — Encounter: Payer: Self-pay | Admitting: Family Medicine

## 2019-09-22 VITALS — BP 148/94 | HR 59 | Temp 97.5°F | Ht 70.0 in | Wt 287.2 lb

## 2019-09-22 DIAGNOSIS — I10 Essential (primary) hypertension: Secondary | ICD-10-CM

## 2019-09-22 DIAGNOSIS — L309 Dermatitis, unspecified: Secondary | ICD-10-CM

## 2019-09-22 MED ORDER — TRIAMCINOLONE ACETONIDE 0.1 % EX CREA: 1.0000 "application " | TOPICAL_CREAM | Freq: Two times a day (BID) | CUTANEOUS | 3 refills | Status: DC | PRN

## 2019-09-22 MED ORDER — LOSARTAN POTASSIUM 50 MG PO TABS: 50.0000 mg | ORAL_TABLET | Freq: Every day | ORAL | 3 refills | Status: DC

## 2019-09-22 NOTE — Patient Instructions (Signed)

## 2019-09-22 NOTE — Progress Notes (Signed)
Subjective:     Patient ID: Hector [name], male   DOB: 04-24-1971, 48 y.o.   MRN: [id number]  HPI Patient is seen for the following concerns  Pruritic rash left leg.  He has had similar dermatitis in the past.  He was told once before this was a "stasis dermatitis ".  He has pruritus.  Not relieved with over-the-counter medication.  Has responded to topical steroids in the past.  Moderate pruritis.  His main concern is that he had episode on the ninth of this month where he states he had some transient visual disturbance.  He states in his right peripheral vision there appeared to be some "purple lines" that lasted about 20 minutes.  He thinks there may been some loss of vision but not sure.  This was preceded by feeling that he had some blood "rushing to his head ".  No headache.  He states he noted a "whooshing type sound in his ear but denies any pulsatile tinnitus.  No hearing loss.  He denies any speech changes, swallowing difficulties, focal weakness, confusion, seizure  Feels fine at this time.  He does have hypertension and is on carvedilol.  Not monitoring blood pressure at home  Past Medical History:  Diagnosis Date  . Allergy   . Anal fissure   . Anxiety   . Blood in stool   . Depression   . Hypertension   . Irritable bowel syndrome with diarrhea   . Jaundice, newborn   . Panic disorder   . Seizures (HCC)    febrile  . Sleep apnea    Past Surgical History:  Procedure Laterality Date  . [place]    reports that he has never smoked. He has never used smokeless tobacco. He reports current alcohol use. He reports current drug use. Drug: Marijuana. family history includes Basal cell carcinoma in his mother; Colitis in his brother; Colon polyps in his brother and father; Dementia in his mother; Diabetes in his father; Heart attack in his father; Heart disease (age of onset: 47) in his father; Hyperlipidemia in his father;  Hypertension in his mother; Skin cancer in his mother; Squamous cell carcinoma in his mother; Stroke in his father and mother. No Known Allergies   Review of Systems  Constitutional: Negative for appetite change, fatigue and unexpected weight change.  Eyes: Negative for photophobia.  Respiratory: Negative for cough, chest tightness and shortness of breath.   Cardiovascular: Negative for chest pain, palpitations and leg swelling.  Endocrine: Negative for polydipsia and polyuria.  Genitourinary: Negative for dysuria.  Neurological: Negative for dizziness, syncope, weakness, light-headedness and headaches.       Objective:   Physical Exam Constitutional:      Appearance: He is well-developed.  HENT:     Right Ear: External ear normal.     Left Ear: External ear normal.  Eyes:     Pupils: Pupils are equal, round, and reactive to light.  Neck:     Musculoskeletal: Neck supple.     Thyroid: No thyromegaly.     Comments: No carotid bruits Cardiovascular:     Rate and Rhythm: Normal rate and regular rhythm.  Pulmonary:     Effort: Pulmonary effort is normal. No respiratory distress.     Breath sounds: Normal breath sounds. No wheezing or rales.  Musculoskeletal:     Right lower leg: No edema.     Left lower leg: No edema.  Skin:    Findings: Rash present.     Comments: He has some nonspecific dermatitis changes left leg with few excoriations  Neurological:     General: No focal deficit present.     Mental Status: He is alert and oriented to person, place, and time.     Cranial Nerves: No cranial nerve deficit.     Motor: No weakness.     Coordination: Coordination normal.     Gait: Gait normal.     Deep Tendon Reflexes: Reflexes normal.        Assessment:     #1 nonspecific dermatitis left leg  #2 hypertension poorly controlled  #3 vague transient symptoms of abnormal sensation in head along with very transient right visual symptoms.  No history of ocular migraine.   No associated headache.    Plan:     -Triamcinolone 0.1% cream twice daily to left leg as needed -Recommend add losartan 50 mg daily and continue carvedilol -Follow-up immediately for any headache or any other focal neurologic concerns.  Would recommend 1 month follow-up to reassess blood pressure. -follow up immediately for any recurrent visual changes or other concerns.  [name] MD [facility]

## 2019-10-15 NOTE — Progress Notes (Signed)
Cardiology Office Note   Date:  10/16/2019   ID:  Hector [name], DOB [birth year], MRN [id number]  PCP:  [name], MD    No chief complaint on file.  CAD  Wt Readings from Last 3 Encounters:  10/16/19 288 lb 1.9 oz (130.7 kg)  09/22/19 287 lb 3.2 oz (130.3 kg)  07/12/19 286 lb (129.7 kg)       History of Present Illness: Hector [name] is a 48 y.o. male  Who I saw in 2017 for chest pain while he was hospitalized.  He had a stress echo at that time: - Stress ECG conclusions: There were no stress arrhythmias or   conduction abnormalities. The stress ECG was negative for   ischemia. - Staged echo: There was no echocardiographic evidence for   stress-induced ischemia. - Impressions: Normal stress echo   Seen in 09/2018 for chest tightness while watching TV and eased with deep breathing.  He was very sedentary.  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  Over the past year, he had an episode of soreness and pain in the middle of his chest, intermittently.  Last episode was 6 weeks ago.  It resolves spontaneously.    [employer] work is his most strenuous activity.  No chest pain with that activity.    Past Medical History:  Diagnosis Date   Allergy    Anal fissure    Anxiety    Blood in stool    Depression    Hypertension    Irritable bowel syndrome with diarrhea    Jaundice, newborn    Panic disorder    Seizures ([place])    febrile   Sleep apnea     Past Surgical History:  Procedure Laterality Date   HEMORRHOID BANDING     WISDOM TOOTH EXTRACTION  1991     Current Outpatient Medications  Medication Sig Dispense Refill   atorvastatin (LIPITOR) 40 MG tablet Take 1 tablet (40 mg total) by mouth daily. 90 tablet 3   FLUoxetine (PROZAC) 10 MG capsule TAKE 1 CAPSULE BY MOUTH EVERY DAY 90 capsule 3   LORazepam (ATIVAN) 1 MG  tablet Take 1 tablet (1 mg total) by mouth every 8 (eight) hours as needed for anxiety. 30 tablet 0   losartan (COZAAR) 50 MG tablet Take 1 tablet (50 mg total) by mouth daily. 30 tablet 3   nebivolol (BYSTOLIC) 10 MG tablet TAKE 1 TABLET BY MOUTH ONCE DAILY. 90 tablet 3   nitroGLYCERIN (NITROSTAT) 0.4 MG SL tablet Place 1 tablet (0.4 mg total) under the tongue every 5 (five) minutes as needed for chest pain. 20 tablet 0   triamcinolone cream (KENALOG) 0.1 % Apply 1 application topically 2 (two) times daily as needed. 30 g 3   No current facility-administered medications for this visit.     Allergies:   Patient has no known allergies.    Social History:  The patient  reports that he has never smoked. He has never used smokeless tobacco. He reports current alcohol use. He reports current drug use. Drug: Marijuana.   Family History:  The patient's *family history includes Basal cell carcinoma in his mother; Colitis in his brother; Colon polyps in his brother and father; Dementia in his mother; Diabetes in his father; Heart attack in his father; Heart disease (age of onset: 53) in his father; Hyperlipidemia in his  father; Hypertension in his mother; Skin cancer in his mother; Squamous cell carcinoma in his mother; Stroke in his father and mother.    ROS:  Please see the history of present illness.   Otherwise, review of systems are positive for hearing sounds in his neck and having a temporary visual change- saw his PMD and losartan.   All other systems are reviewed and negative.    PHYSICAL EXAM: VS:  BP (!) 154/90    Pulse 60    Ht 5' 10"  (1.778 m)    Wt 288 lb 1.9 oz (130.7 kg)    SpO2 99%    BMI 41.34 kg/m  , BMI Body mass index is 41.34 kg/m. GEN: Well nourished, well developed, in no acute distress  HEENT: normal  Neck: no JVD, carotid bruits, or masses Cardiac: RRR; no murmurs, rubs, or gallops,no edema  Respiratory:  clear to auscultation bilaterally, normal work of  breathing GI: soft, nontender, nondistended, + BS MS: no deformity or atrophy  Skin: warm and dry, no rash Neuro:  Strength and sensation are intact Psych: euthymic mood, full affect   EKG:   The ekg ordered today demonstrates NSR, no ST changes   Recent Labs: 06/12/2019: ALT 21; BUN 22; Creatinine, Ser 0.82; Potassium 3.8; Sodium 139   Lipid Panel    Component Value Date/Time   CHOL 103 06/12/2019 1059   TRIG 61.0 06/12/2019 1059   HDL 37.40 (L) 06/12/2019 1059   CHOLHDL 3 06/12/2019 1059   VLDL 12.2 06/12/2019 1059   LDLCALC 54 06/12/2019 1059     Other studies Reviewed: Additional studies/ records that were reviewed today with results demonstrating: labs reviewed.   ASSESSMENT AND PLAN:  1. Chest pain/CAD coronary calcium by CT scan-proximal LAD:  No chest pain in the past 6 weeks.  RF modification has been the plan.  He does have some DOE.  We discussed heart cath but he wants to defer.  If sx get worse, he will let us know. We would consider heart cath.   2. HTN: BP at home is not checked regularly.  Losartan was started 3 weeks ago.  We discussed carotid DOppler but he would like to hold off 3. Obesity: Decreased sugar soda intake.   4. FH of CAD: RF modification.   5. Hyperlipidemia: LDL 64 in 05/2019.  Continue atorvastatin.    Current medicines are reviewed at length with the patient today.  The patient concerns regarding his medicines were addressed.  The following changes have been made:  No change  Labs/ tests ordered today include:  No orders of the defined types were placed in this encounter.   Recommend 150 minutes/week of aerobic exercise Low fat, low carb, high fiber diet recommended  Disposition:   FU in 1 year   Signed, Larae Grooms, MD  10/16/2019 10:25 AM    [facility] [address], [place],   [address] Phone: [phone]; Fax: [phone]

## 2019-10-16 ENCOUNTER — Ambulatory Visit: Payer: BC Managed Care – PPO | Admitting: Interventional Cardiology

## 2019-10-16 ENCOUNTER — Other Ambulatory Visit: Payer: Self-pay

## 2019-10-16 ENCOUNTER — Encounter: Payer: Self-pay | Admitting: Interventional Cardiology

## 2019-10-16 VITALS — BP 154/90 | HR 60 | Ht 70.0 in | Wt 288.1 lb

## 2019-10-16 DIAGNOSIS — E785 Hyperlipidemia, unspecified: Secondary | ICD-10-CM

## 2019-10-16 DIAGNOSIS — I10 Essential (primary) hypertension: Secondary | ICD-10-CM | POA: Diagnosis not present

## 2019-10-16 DIAGNOSIS — I25119 Atherosclerotic heart disease of native coronary artery with unspecified angina pectoris: Secondary | ICD-10-CM | POA: Diagnosis not present

## 2019-10-16 DIAGNOSIS — Z8249 Family history of ischemic heart disease and other diseases of the circulatory system: Secondary | ICD-10-CM

## 2019-10-16 NOTE — Patient Instructions (Signed)
Medication Instructions:  Your physician recommends that you continue on your current medications as directed. Please refer to the Current Medication list given to you today.  *If you need a refill on your cardiac medications before your next appointment, please call your pharmacy*  Lab Work: None ordered If you have labs (blood work) drawn today and your tests are completely normal, you will receive your results only by: Marland Kitchen MyChart Message (if you have MyChart) OR . A paper copy in the mail If you have any lab test that is abnormal or we need to change your treatment, we will call you to review the results.  Testing/Procedures: None ordered  Follow-Up: At [facility], you and your health needs are our priority.  As part of our continuing mission to provide you with exceptional heart care, we have created designated Provider Care Teams.  These Care Teams include your primary Cardiologist (physician) and Advanced Practice Providers (APPs -  Physician Assistants and Nurse Practitioners) who all work together to provide you with the care you need, when you need it.  Your next appointment:   12 months  The format for your next appointment:   In Person  Provider:   Casandra Doffing, MD  Other Instructions  Heart-Healthy Eating Plan Heart-healthy meal planning includes:  Eating less unhealthy fats.  Eating more healthy fats.  Making other changes in your diet. Talk with your doctor or a diet specialist (dietitian) to create an eating plan that is right for you. What is my plan? Your doctor may recommend an eating plan that includes:  Total fat: ______% or less of total calories a day.  Saturated fat: ______% or less of total calories a day.  Cholesterol: less than _________mg a day. What are tips for following this plan? Cooking Avoid frying your food. Try to bake, boil, grill, or broil it instead. You can also reduce fat by:  Removing the skin from poultry.  Removing all  visible fats from meats.  Steaming vegetables in water or broth. Meal planning   At meals, divide your plate into four equal parts: ? Fill one-half of your plate with vegetables and green salads. ? Fill one-fourth of your plate with whole grains. ? Fill one-fourth of your plate with lean protein foods.  Eat 4-5 servings of vegetables per day. A serving of vegetables is: ? 1 cup of raw or cooked vegetables. ? 2 cups of raw leafy greens.  Eat 4-5 servings of fruit per day. A serving of fruit is: ? 1 medium whole fruit. ?  cup of dried fruit. ?  cup of fresh, frozen, or canned fruit. ?  cup of 100% fruit juice.  Eat more foods that have soluble fiber. These are apples, broccoli, carrots, beans, peas, and barley. Try to get 20-30 g of fiber per day.  Eat 4-5 servings of nuts, legumes, and seeds per week: ? 1 serving of dried beans or legumes equals  cup after being cooked. ? 1 serving of nuts is  cup. ? 1 serving of seeds equals 1 tablespoon. General information  Eat more home-cooked food. Eat less restaurant, buffet, and fast food.  Limit or avoid alcohol.  Limit foods that are high in starch and sugar.  Avoid fried foods.  Lose weight if you are overweight.  Keep track of how much salt (sodium) you eat. This is important if you have high blood pressure. Ask your doctor to tell you more about this.  Try to add vegetarian meals each  week. Fats  Choose healthy fats. These include olive oil and canola oil, flaxseeds, walnuts, almonds, and seeds.  Eat more omega-3 fats. These include salmon, mackerel, sardines, tuna, flaxseed oil, and ground flaxseeds. Try to eat fish at least 2 times each week.  Check food labels. Avoid foods with trans fats or high amounts of saturated fat.  Limit saturated fats. ? These are often found in animal products, such as meats, butter, and cream. ? These are also found in plant foods, such as palm oil, palm kernel oil, and coconut oil.   Avoid foods with partially hydrogenated oils in them. These have trans fats. Examples are stick margarine, some tub margarines, cookies, crackers, and other baked goods. What foods can I eat? Fruits All fresh, canned (in natural juice), or frozen fruits. Vegetables Fresh or frozen vegetables (raw, steamed, roasted, or grilled). Green salads. Grains Most grains. Choose whole wheat and whole grains most of the time. Rice and pasta, including brown rice and pastas made with whole wheat. Meats and other proteins Lean, well-trimmed beef, veal, pork, and lamb. Chicken and [place] without skin. All fish and shellfish. Wild duck, rabbit, pheasant, and venison. Egg whites or low-cholesterol egg substitutes. Dried beans, peas, lentils, and tofu. Seeds and most nuts. Dairy Low-fat or nonfat cheeses, including ricotta and mozzarella. Skim or 1% milk that is liquid, powdered, or evaporated. Buttermilk that is made with low-fat milk. Nonfat or low-fat yogurt. Fats and oils Non-hydrogenated (trans-free) margarines. Vegetable oils, including soybean, sesame, sunflower, olive, peanut, safflower, corn, canola, and cottonseed. Salad dressings or mayonnaise made with a vegetable oil. Beverages Mineral water. Coffee and tea. Diet carbonated beverages. Sweets and desserts Sherbet, gelatin, and fruit ice. Small amounts of dark chocolate. Limit all sweets and desserts. Seasonings and condiments All seasonings and condiments. The items listed above may not be a complete list of foods and drinks you can eat. Contact a dietitian for more options. What foods should I avoid? Fruits Canned fruit in heavy syrup. Fruit in cream or butter sauce. Fried fruit. Limit coconut. Vegetables Vegetables cooked in cheese, cream, or butter sauce. Fried vegetables. Grains Breads that are made with saturated or trans fats, oils, or whole milk. Croissants. Sweet rolls. Donuts. High-fat crackers, such as cheese crackers. Meats and  other proteins Fatty meats, such as hot dogs, ribs, sausage, bacon, rib-eye roast or steak. High-fat deli meats, such as salami and bologna. Caviar. Domestic duck and goose. Organ meats, such as liver. Dairy Cream, sour cream, cream cheese, and creamed cottage cheese. Whole-milk cheeses. Whole or 2% milk that is liquid, evaporated, or condensed. Whole buttermilk. Cream sauce or high-fat cheese sauce. Yogurt that is made from whole milk. Fats and oils Meat fat, or shortening. Cocoa butter, hydrogenated oils, palm oil, coconut oil, palm kernel oil. Solid fats and shortenings, including bacon fat, salt pork, lard, and butter. Nondairy cream substitutes. Salad dressings with cheese or sour cream. Beverages Regular sodas and juice drinks with added sugar. Sweets and desserts Frosting. Pudding. Cookies. Cakes. Pies. Milk chocolate or white chocolate. Buttered syrups. Full-fat ice cream or ice cream drinks. The items listed above may not be a complete list of foods and drinks to avoid. Contact a dietitian for more information. Summary  Heart-healthy meal planning includes eating less unhealthy fats, eating more healthy fats, and making other changes in your diet.  Eat a balanced diet. This includes fruits and vegetables, low-fat or nonfat dairy, lean protein, nuts and legumes, whole grains, and heart-healthy oils and fats.  This information is not intended to replace advice given to you by your health care provider. Make sure you discuss any questions you have with your health care provider. Document Released: 06/14/2012 Document Revised: 02/17/2018 Document Reviewed: 01/21/2018 Elsevier Patient Education  2020 [employer].

## 2019-10-24 ENCOUNTER — Encounter: Payer: Self-pay | Admitting: Family Medicine

## 2019-10-24 ENCOUNTER — Other Ambulatory Visit: Payer: Self-pay

## 2019-10-24 ENCOUNTER — Ambulatory Visit: Payer: BC Managed Care – PPO | Admitting: Family Medicine

## 2019-10-24 VITALS — BP 124/84 | HR 55 | Temp 97.8°F | Resp 16 | Ht 70.0 in | Wt 284.9 lb

## 2019-10-24 DIAGNOSIS — I10 Essential (primary) hypertension: Secondary | ICD-10-CM | POA: Diagnosis not present

## 2019-10-24 NOTE — Patient Instructions (Signed)
Try to lose some weight and get more exercise  Let's plan on 3 month follow up.

## 2019-10-24 NOTE — Progress Notes (Signed)
Subjective:     Patient ID: Hector [name], male   DOB: 1971/09/19, 48 y.o.   MRN: [id number]  HPI   Hector [name] is here for follow-up hypertension..  He has history of obesity, hypertension, dyslipidemia.  He just recently seen cardiology.  He had chest pain back in 2017 and had stress echo with no evidence of arrhythmias or conduction abnormalities on stress EKG and no ischemia.  No stress-induced ischemia.  Normal stress echo.  He has continued to have some atypical symptoms.  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  He is now on Lipitor 40 mg daily.  He is on Bystolic 10 mg daily and we recently added losartan 50 mg daily.  He has home cuff but has not been monitoring regularly.  He plans to start exercising more soon.  Just got a dog.  Knows he needs to lose some weight.  No headaches or dizziness.  No chest pain currently.  He has dyspnea with minimal exertion  Past Medical History:  Diagnosis Date  . Allergy   . Anal fissure   . Anxiety   . Blood in stool   . Depression   . Hypertension   . Irritable bowel syndrome with diarrhea   . Jaundice, newborn   . Panic disorder   . Seizures (HCC)    febrile  . Sleep apnea    Past Surgical History:  Procedure Laterality Date  . [place]    reports that he has never smoked. He has never used smokeless tobacco. He reports current alcohol use. He reports current drug use. Drug: Marijuana. family history includes Basal cell carcinoma in his mother; Colitis in his brother; Colon polyps in his brother and father; Dementia in his mother; Diabetes in his father; Heart attack in his father; Heart disease (age of onset: 61) in his father; Hyperlipidemia in his father; Hypertension in his mother; Skin cancer in his mother; Squamous cell carcinoma in his mother; Stroke in his father and mother. No Known Allergies   Review of Systems   Constitutional: Negative for chills, fatigue and fever.  Eyes: Negative for visual disturbance.  Respiratory: Positive for shortness of breath. Negative for cough, chest tightness and wheezing.   Cardiovascular: Negative for chest pain, palpitations and leg swelling.  Neurological: Negative for dizziness, syncope, weakness, light-headedness and headaches.       Objective:   Physical Exam Constitutional:      Appearance: Normal appearance.  Cardiovascular:     Rate and Rhythm: Normal rate and regular rhythm.  Pulmonary:     Effort: Pulmonary effort is normal.     Breath sounds: Normal breath sounds.  Musculoskeletal:     Right lower leg: No edema.     Left lower leg: No edema.  Neurological:     Mental Status: He is alert.        Assessment:     Blood pressure is improved today by our readings but was up recently in cardiology office.  We discussed the following    Plan:     -Needs to lose weight.  He is in morbid obese classification and we discussed implications of this in terms of increased overall morbidity and mortality. -Needs to increase exercise to minimum 150 minutes/week of moderate activity. -Continue losartan.  We recommended home monitoring and be in touch if consistently greater than 140/90.  Otherwise follow-up  in 3 months to reassess -Flu vaccine given -Follow-up promptly with cardiology for any recurrent chest pains  [name] MD [facility]

## 2019-11-16 ENCOUNTER — Ambulatory Visit (INDEPENDENT_AMBULATORY_CARE_PROVIDER_SITE_OTHER): Payer: BC Managed Care – PPO | Admitting: Family Medicine

## 2019-11-16 ENCOUNTER — Other Ambulatory Visit: Payer: Self-pay

## 2019-11-16 ENCOUNTER — Encounter (INDEPENDENT_AMBULATORY_CARE_PROVIDER_SITE_OTHER): Payer: Self-pay | Admitting: Family Medicine

## 2019-11-16 VITALS — BP 134/72 | HR 52 | Temp 97.7°F | Ht 69.0 in | Wt 282.0 lb

## 2019-11-16 DIAGNOSIS — Z9189 Other specified personal risk factors, not elsewhere classified: Secondary | ICD-10-CM

## 2019-11-16 DIAGNOSIS — R7303 Prediabetes: Secondary | ICD-10-CM | POA: Diagnosis not present

## 2019-11-16 DIAGNOSIS — R0602 Shortness of breath: Secondary | ICD-10-CM | POA: Diagnosis not present

## 2019-11-16 DIAGNOSIS — G4733 Obstructive sleep apnea (adult) (pediatric): Secondary | ICD-10-CM

## 2019-11-16 DIAGNOSIS — E7849 Other hyperlipidemia: Secondary | ICD-10-CM

## 2019-11-16 DIAGNOSIS — Z6841 Body Mass Index (BMI) 40.0 and over, adult: Secondary | ICD-10-CM

## 2019-11-16 DIAGNOSIS — I10 Essential (primary) hypertension: Secondary | ICD-10-CM

## 2019-11-16 DIAGNOSIS — R5383 Other fatigue: Secondary | ICD-10-CM

## 2019-11-16 DIAGNOSIS — F3289 Other specified depressive episodes: Secondary | ICD-10-CM | POA: Diagnosis not present

## 2019-11-16 DIAGNOSIS — Z0289 Encounter for other administrative examinations: Secondary | ICD-10-CM

## 2019-11-16 DIAGNOSIS — E66813 Obesity, class 3: Secondary | ICD-10-CM

## 2019-11-17 LAB — COMPREHENSIVE METABOLIC PANEL
ALT: 21 IU/L (ref 0–44)
AST: 18 IU/L (ref 0–40)
Albumin/Globulin Ratio: 2 (ref 1.2–2.2)
Albumin: 4.3 g/dL (ref 4.0–5.0)
Alkaline Phosphatase: 89 IU/L (ref 39–117)
BUN/Creatinine Ratio: 18 (ref 9–20)
BUN: 18 mg/dL (ref 6–24)
Bilirubin Total: 0.3 mg/dL (ref 0.0–1.2)
CO2: 25 mmol/L (ref 20–29)
Calcium: 9.2 mg/dL (ref 8.7–10.2)
Chloride: 105 mmol/L (ref 96–106)
Creatinine, Ser: 0.98 mg/dL (ref 0.76–1.27)
GFR calc Af Amer: 105 mL/min/{1.73_m2} (ref >59)
GFR calc non Af Amer: 91 mL/min/{1.73_m2} (ref >59)
Globulin, Total: 2.2 g/dL (ref 1.5–4.5)
Glucose: 91 mg/dL (ref 65–99)
Potassium: 4.5 mmol/L (ref 3.5–5.2)
Sodium: 143 mmol/L (ref 134–144)
Total Protein: 6.5 g/dL (ref 6.0–8.5)

## 2019-11-17 LAB — CBC WITH DIFFERENTIAL/PLATELET
Basophils Absolute: 0.1 10*3/uL (ref 0.0–0.2)
Basos: 1 %
EOS (ABSOLUTE): 0.3 10*3/uL (ref 0.0–0.4)
Eos: 5 %
Hematocrit: 44.8 % (ref 37.5–51.0)
Hemoglobin: 15.5 g/dL (ref 13.0–17.7)
Immature Grans (Abs): 0 10*3/uL (ref 0.0–0.1)
Immature Granulocytes: 0 %
Lymphocytes Absolute: 1.8 10*3/uL (ref 0.7–3.1)
Lymphs: 28 %
MCH: 30.5 pg (ref 26.6–33.0)
MCHC: 34.6 g/dL (ref 31.5–35.7)
MCV: 88 fL (ref 79–97)
Monocytes Absolute: 0.4 10*3/uL (ref 0.1–0.9)
Monocytes: 7 %
Neutrophils Absolute: 3.9 10*3/uL (ref 1.4–7.0)
Neutrophils: 59 %
Platelets: 150 10*3/uL (ref 150–450)
RBC: 5.08 x10E6/uL (ref 4.14–5.80)
RDW: 12.6 % (ref 11.6–15.4)
WBC: 6.5 10*3/uL (ref 3.4–10.8)

## 2019-11-17 LAB — T3: T3, Total: 163 ng/dL (ref 71–180)

## 2019-11-17 LAB — LIPID PANEL WITH LDL/HDL RATIO
Cholesterol, Total: 136 mg/dL (ref 100–199)
HDL: 35 mg/dL — ABNORMAL LOW (ref >39)
LDL Chol Calc (NIH): 81 mg/dL (ref 0–99)
LDL/HDL Ratio: 2.3 ratio (ref 0.0–3.6)
Triglycerides: 105 mg/dL (ref 0–149)
VLDL Cholesterol Cal: 20 mg/dL (ref 5–40)

## 2019-11-17 LAB — HEMOGLOBIN A1C
Est. average glucose Bld gHb Est-mCnc: 103 mg/dL
Hgb A1c MFr Bld: 5.2 % (ref 4.8–5.6)

## 2019-11-17 LAB — INSULIN, RANDOM: INSULIN: 35.9 u[IU]/mL — ABNORMAL HIGH (ref 2.6–24.9)

## 2019-11-17 LAB — VITAMIN B12: Vitamin B-12: 484 pg/mL (ref 232–1245)

## 2019-11-17 LAB — VITAMIN D 25 HYDROXY (VIT D DEFICIENCY, FRACTURES): Vit D, 25-Hydroxy: 17.8 ng/mL — ABNORMAL LOW (ref 30.0–100.0)

## 2019-11-17 LAB — T4, FREE: Free T4: 1.29 ng/dL (ref 0.82–1.77)

## 2019-11-17 LAB — TSH: TSH: 2.41 u[IU]/mL (ref 0.450–4.500)

## 2019-11-21 ENCOUNTER — Encounter (INDEPENDENT_AMBULATORY_CARE_PROVIDER_SITE_OTHER): Payer: Self-pay | Admitting: Family Medicine

## 2019-11-21 NOTE — Progress Notes (Signed)
Office: [phone]  /  Fax: [phone]   Dear Dr. Elease [name],   Thank you for referring Hector [name] to our clinic. The following note includes my evaluation and treatment recommendations.  HPI:   Chief Complaint: OBESITY    Hector [name] has been referred by Hector Post, MD for consultation regarding his obesity and obesity related comorbidities.    Hector [name] (MR# [id number]) is a 48 y.o. male who presents on 11/16/2019 for obesity evaluation and treatment. Current BMI is Body mass index is 41.64 kg/m. Hector [name] has been struggling with his weight for many years and has been unsuccessful in either losing weight, maintaining weight loss, or reaching his healthy weight goal.     Hector [name] states he is currently in the action stage of change and ready to dedicate time achieving and maintaining a healthier weight. Hector [name] is interested in becoming our patient and working on intensive lifestyle modifications including (but not limited to) diet, exercise and weight loss.    Hector [name] states his family eats meals together he struggles with family and or coworkers weight loss sabotage his desired weight loss is 62 lbs he has been heavy most of  his life he started gaining weight in his teens, early teens between [age] years old his heaviest weight ever was 295 lbs he has significant food cravings issues  he snacks frequently in the evenings he skips meals frequently he is frequently drinking liquids with calories he frequently makes poor food choices he frequently eats larger portions than normal  he struggles with emotional eating    Fatigue Hector [name] feels his energy is lower than it should be. This has worsened with weight gain and has not worsened recently. Hector [name] admits to daytime somnolence and  admits to waking up still tired. Patient is at risk for obstructive sleep apnea. Patent has a history of symptoms of daytime fatigue and morning headache. Patient generally gets 7 hours of sleep per  night, and states they generally have nightime awakenings. Snoring is present. Apneic episodes are present. Epworth Sleepiness Score is 13.  Dyspnea on exertion Hector [name] notes increasing shortness of breath with exercising and seems to be worsening over time with weight gain. He notes getting out of breath sooner with activity than he used to. This has not gotten worse recently. Hector [name] denies orthopnea.  Hypertension Hector [name] is a 48 y.o. male with hypertension. Hector [name] is on Bystolic and he denies chest pain. He is working on weight loss to help control his blood pressure with the goal of decreasing his risk of heart attack and stroke.  Hyperlipidemia Hector [name] has hyperlipidemia and has been trying to improve his cholesterol levels with intensive lifestyle modification including a low saturated fat diet, exercise and weight loss. He is on Lipitor and denies any chest pain, claudication or myalgias.  Pre-Diabetes Hector [name] has a diagnosis of pre-diabetes based on his elevated Hgb A1c and was informed this puts him at greater risk of developing diabetes. He is not taking metformin currently and continues to work on diet and exercise to decrease risk of diabetes.  At risk for diabetes Hector [name] is at higher than average risk for developing diabetes due to his obesity and pre-diabetes. He currently denies polyuria or polydipsia.  Obstructive Sleep Apnea Hector [name]'s last sleep study was 10 years ago. His epworth score is 13. He notes snoring, morning headaches, and apneic episodes.  Depression with Emotional Eating Behaviors Hector [name] states he eats for comfort and overeats. Hector [name] struggles with emotional  eating and using food for comfort to the extent that it is negatively impacting his health. He often snacks when he is not hungry. Hector [name] sometimes feels he is out of control and then feels guilty that he made poor food choices. He has been working on behavior modification techniques to help reduce his emotional eating and has  been somewhat successful. He shows no sign of suicidal or homicidal ideations.  Depression Screen Hector [name]'s Food and Mood (modified PHQ-9) score was  Depression screen PHQ 2/9 11/16/2019  Decreased Interest 1  Down, Depressed, Hopeless 1  PHQ - 2 Score 2  Altered sleeping 3  Tired, decreased energy 3  Change in appetite 2  Feeling bad or failure about yourself  3  Trouble concentrating 1  Moving slowly or fidgety/restless 1  Suicidal thoughts 0  PHQ-9 Score 15  Difficult doing work/chores Somewhat difficult    ASSESSMENT AND PLAN:  Other fatigue - Plan: T3, T4, free, TSH, CBC w/Diff/Platelet, Vitamin D (25 hydroxy), B12  Shortness of breath on exertion  Other hyperlipidemia - Plan: Lipid Panel With LDL/HDL Ratio  Essential hypertension  Prediabetes - Plan: Comprehensive Metabolic Panel (CMET), HgB A1c, Insulin, random  OSA (obstructive sleep apnea)  Other depression, emotional eating  At risk for diabetes mellitus  Class 3 severe obesity with serious comorbidity and body mass index (BMI) of 40.0 to 44.9 in adult, unspecified obesity type (HCC)  PLAN:  Fatigue Hector [name] was informed that his fatigue may be related to obesity, depression or many other causes. Labs will be ordered, and in the meanwhile Hector [name] has agreed to work on diet, exercise and weight loss to help with fatigue. Proper sleep hygiene was discussed including the need for 7-8 hours of quality sleep each night. A sleep study was not ordered based on symptoms and Epworth score.  Dyspnea on exertion Hector [name]'s shortness of breath appears to be obesity related and exercise induced. He has agreed to work on weight loss and gradually increase exercise to treat his exercise induced shortness of breath. If Hector [name] follows our instructions and loses weight without improvement of his shortness of breath, we will plan to refer to pulmonology. We will monitor this condition regularly. Hector [name] agrees to this plan.  Hypertension We  discussed sodium restriction, working on healthy weight loss, and a regular exercise program as the means to achieve improved blood pressure control. Hector [name] agreed with this plan and agreed to follow up as directed. We will continue to monitor his blood pressure as well as his progress with the above lifestyle modifications. Nahuel will continue his medications as prescribed and will watch for signs of hypotension as he continues his lifestyle modifications. [name] agrees to follow up with our clinic in 2 weeks.  Hyperlipidemia Fausto was informed of the American Heart Association Guidelines emphasizing intensive lifestyle modifications as the first line treatment for hyperlipidemia. We discussed many lifestyle modifications today in depth, and Lorena will continue to work on decreasing saturated fats such as fatty red meat, butter and many fried foods. He will also increase vegetables and lean protein in his diet and continue to work on exercise and weight loss efforts. Jguadalupe will continue his medications and we will check labs today. Christian agrees to follow up with our clinic in 2 weeks.  Pre-Diabetes Willey will continue to work on weight loss, exercise, and decreasing simple carbohydrates in his diet to help decrease the risk of diabetes. We dicussed metformin including benefits and risks. He was informed that eating too  many simple carbohydrates or too many calories at one sitting increases the likelihood of GI side effects. We will check labs today. Naser agrees to follow up with [place] as directed to monitor his progress.  Diabetes risk counseling Taygen was given extended (15 minutes) diabetes prevention counseling today. He is 48 y.o. male and has risk factors for diabetes including obesity and pre-diabetes. We discussed intensive lifestyle modifications today with an emphasis on weight loss as well as increasing exercise and decreasing simple carbohydrates in his diet.  Obstructive Sleep Apnea  Sylvester declined a sleep  study referral. He states he could not tolerate the mask due to anxiety. We will continue to monitor.  Depression with Emotional Eating Behaviors We discussed behavior modification techniques today to help Rocco deal with his emotional eating and depression. We will refer to Dr. Mallie Mussel, our Bariatric Psychologist for evaluation.  Depression Screen Bellamy had a strongly positive depression screening. Depression is commonly associated with obesity and often results in emotional eating behaviors. We will monitor this closely and work on CBT to help improve the non-hunger eating patterns. Referral to Psychology may be required if no improvement is seen as he continues in our clinic.  Obesity Wylie is currently in the action stage of change and his goal is to continue with weight loss efforts. I recommend Marven begin the structured treatment plan as follows:  He has agreed to follow the Category 2 plan Koua has been instructed to eventually work up to a goal of 150 minutes of combined cardio and strengthening exercise per week for weight loss and overall health benefits. We discussed the following Behavioral Modification Strategies today: increasing lean protein intake, decreasing simple carbohydrates, increasing vegetables, increase H20 intake, work on meal planning and easy cooking plans, holiday eating strategies  and decrease liquid calories   He was informed of the importance of frequent follow up visits to maximize his success with intensive lifestyle modifications for his multiple health conditions. He was informed we would discuss his lab results at his next visit unless there is a critical issue that [name] to be addressed sooner. Won agreed to keep his next visit at the agreed upon time to discuss these results.  ALLERGIES: No Known Allergies  MEDICATIONS: Current Outpatient Medications on File Prior to Visit  Medication Sig Dispense Refill  . atorvastatin (LIPITOR) 40 MG tablet Take 1 tablet  (40 mg total) by mouth daily. 90 tablet 3  . FLUoxetine (PROZAC) 10 MG capsule TAKE 1 CAPSULE BY MOUTH EVERY DAY 90 capsule 3  . LORazepam (ATIVAN) 1 MG tablet Take 1 tablet (1 mg total) by mouth every 8 (eight) hours as needed for anxiety. 30 tablet 0  . losartan (COZAAR) 50 MG tablet Take 1 tablet (50 mg total) by mouth daily. 30 tablet 3  . nebivolol (BYSTOLIC) 10 MG tablet TAKE 1 TABLET BY MOUTH ONCE DAILY. 90 tablet 3  . nitroGLYCERIN (NITROSTAT) 0.4 MG SL tablet Place 1 tablet (0.4 mg total) under the tongue every 5 (five) minutes as needed for chest pain. (Patient not taking: Reported on 10/24/2019) 20 tablet 0  . triamcinolone cream (KENALOG) 0.1 % Apply 1 application topically 2 (two) times daily as needed. (Patient not taking: Reported on 10/24/2019) 30 g 3   No current facility-administered medications on file prior to visit.     PAST MEDICAL HISTORY: Past Medical History:  Diagnosis Date  . Alcohol abuse   . Allergy   . Anal fissure   . Anxiety   .  Asthma   . Back pain   . Bilateral swelling of feet   . Blood in stool   . Chest pain   . Depression   . Drug use   . Febrile seizure ([place])   . Heartburn   . Hyperlipidemia   . Hypertension   . Irritable bowel syndrome with diarrhea   . Jaundice, newborn   . Palpitations   . Panic   . Panic disorder   . Pre-diabetes   . Seizures (HCC)    febrile  . Shortness of breath   . Sleep apnea     PAST SURGICAL HISTORY: Past Surgical History:  Procedure Laterality Date  . HEMORRHOID BANDING    . WISDOM TOOTH EXTRACTION  1991    SOCIAL HISTORY: Social History   Tobacco Use  . Smoking status: Never Smoker  . Smokeless tobacco: Never Used  Substance Use Topics  . Alcohol use: Yes    Alcohol/week: 0.0 standard drinks    Comment: 1-5 per week  . Drug use: Yes    Types: Marijuana    FAMILY HISTORY: Family History  Problem Relation Age of Onset  . Dementia Mother   . Stroke Mother   . Hypertension Mother   .  Skin cancer Mother   . Basal cell carcinoma Mother   . Squamous cell carcinoma Mother   . Cancer Mother   . Depression Mother   . Diabetes Father   . Hyperlipidemia Father   . Stroke Father   . Heart disease Father 24  . Colon polyps Father   . Heart attack Father   . Hypertension Father   . Depression Father   . Sleep apnea Father   . Obesity Father   . Colitis Brother   . Colon polyps Brother     ROS: Review of Systems  Constitutional: Positive for malaise/fatigue. Negative for weight loss.  HENT: Positive for tinnitus.        + Decreased hearing + Nasal stuffiness  Respiratory: Positive for shortness of breath (with exertion) and wheezing.   Cardiovascular: Negative for chest pain, orthopnea and claudication.  Gastrointestinal: Positive for diarrhea.       + Rectal bleeding  Musculoskeletal: Negative for myalgias.       + Muscle or joint pain + Muscle stiffness  Skin: Positive for itching and rash.       + Dryness  Psychiatric/Behavioral: Positive for depression. Negative for suicidal ideas.       + Stress    PHYSICAL EXAM: Blood pressure 134/72, pulse (!) 52, temperature 97.7 F (36.5 C), temperature source Oral, height 5' 9"  (1.753 m), weight 282 lb (127.9 kg), SpO2 98 %. Body mass index is 41.64 kg/m. Physical Exam Vitals signs reviewed.  Constitutional:      Appearance: Normal appearance. He is obese.  HENT:     Head: Normocephalic and atraumatic.     Nose: Nose normal.  Eyes:     General: No scleral icterus.    Extraocular Movements: Extraocular movements intact.  Neck:     Musculoskeletal: Normal range of motion and neck supple.     Comments: No thyromegaly present Cardiovascular:     Rate and Rhythm: Normal rate and regular rhythm.     Pulses: Normal pulses.     Heart sounds: Normal heart sounds.  Pulmonary:     Effort: Pulmonary effort is normal. No respiratory distress.     Breath sounds: Normal breath sounds.  Abdominal:     Palpations:  Abdomen is soft.     Tenderness: There is no abdominal tenderness.     Comments: + Obesity  Musculoskeletal: Normal range of motion.     Right lower leg: No edema.     Left lower leg: No edema.  Skin:    General: Skin is warm and dry.  Neurological:     Mental Status: He is alert and oriented to person, place, and time.     Coordination: Coordination normal.  Psychiatric:        Mood and Affect: Mood normal.        Behavior: Behavior normal.     RECENT LABS AND TESTS: BMET    Component Value Date/Time   NA 143 11/16/2019 1007   K 4.5 11/16/2019 1007   CL 105 11/16/2019 1007   CO2 25 11/16/2019 1007   GLUCOSE 91 11/16/2019 1007   GLUCOSE 89 06/12/2019 1059   BUN 18 11/16/2019 1007   CREATININE 0.98 11/16/2019 1007   CALCIUM 9.2 11/16/2019 1007   GFRNONAA 91 11/16/2019 1007   GFRAA 105 11/16/2019 1007   Lab Results  Component Value Date   HGBA1C 5.2 11/16/2019   Lab Results  Component Value Date   INSULIN 35.9 (H) 11/16/2019   CBC    Component Value Date/Time   WBC 6.5 11/16/2019 1007   WBC 7.6 04/13/2018 1413   RBC 5.08 11/16/2019 1007   RBC 5.13 04/13/2018 1413   HGB 15.5 11/16/2019 1007   HCT 44.8 11/16/2019 1007   PLT 150 11/16/2019 1007   MCV 88 11/16/2019 1007   MCH 30.5 11/16/2019 1007   MCH 29.7 05/27/2015 1050   MCHC 34.6 11/16/2019 1007   MCHC 35.0 04/13/2018 1413   RDW 12.6 11/16/2019 1007   LYMPHSABS 1.8 11/16/2019 1007   MONOABS 0.6 04/13/2018 1413   EOSABS 0.3 11/16/2019 1007   BASOSABS 0.1 11/16/2019 1007   Iron/TIBC/Ferritin/ %Sat No results found for: IRON, TIBC, FERRITIN, IRONPCTSAT Lipid Panel     Component Value Date/Time   CHOL 136 11/16/2019 1007   TRIG 105 11/16/2019 1007   HDL 35 (L) 11/16/2019 1007   CHOLHDL 3 06/12/2019 1059   VLDL 12.2 06/12/2019 1059   LDLCALC 81 11/16/2019 1007   Hepatic Function Panel     Component Value Date/Time   PROT 6.5 11/16/2019 1007   ALBUMIN 4.3 11/16/2019 1007   AST 18 11/16/2019 1007    ALT 21 11/16/2019 1007   ALKPHOS 89 11/16/2019 1007   BILITOT 0.3 11/16/2019 1007   BILIDIR 0.2 06/12/2019 1059      Component Value Date/Time   TSH 2.410 11/16/2019 1007   TSH 2.02 04/13/2018 1413   TSH 1.78 02/10/2016 1050    ECG  shows NSR with a rate of 60 BPM INDIRECT CALORIMETER done today shows a VO2 of 235 and a REE of 1634.  His calculated basal metabolic rate is 1610 thus his basal metabolic rate is worse than expected.       OBESITY BEHAVIORAL INTERVENTION VISIT  Today's visit was # 1   Starting weight: 282 lbs Starting date: 11/16/2019 Today's weight : 282 lbs Today's date: 11/16/2019 Total lbs lost to date: 0    ASK: We discussed the diagnosis of obesity with Hector [name] today and Li agreed to give [place] permission to discuss obesity behavioral modification therapy today.  ASSESS: Kalid has the diagnosis of obesity and his BMI today is 41.63 Swan is in the action stage of change   ADVISE: Thurmond Butts  was educated on the multiple health risks of obesity as well as the benefit of weight loss to improve his health. He was advised of the need for long term treatment and the importance of lifestyle modifications to improve his current health and to decrease his risk of future health problems.  AGREE: Multiple dietary modification options and treatment options were discussed and  Gerome agreed to follow the recommendations documented in the above note.  ARRANGE: Mahmud was educated on the importance of frequent visits to treat obesity as outlined per CMS and USPSTF guidelines and agreed to schedule his next follow up appointment today.  Wilhemena Durie, am acting as transcriptionist for Briscoe Deutscher, DO  I have reviewed the above documentation for accuracy and completeness, and I agree with the above. Briscoe Deutscher, DO

## 2019-11-30 ENCOUNTER — Other Ambulatory Visit: Payer: Self-pay

## 2019-11-30 ENCOUNTER — Ambulatory Visit (INDEPENDENT_AMBULATORY_CARE_PROVIDER_SITE_OTHER): Payer: BC Managed Care – PPO | Admitting: Family Medicine

## 2019-11-30 VITALS — BP 132/77 | HR 63 | Temp 97.4°F | Ht 69.0 in | Wt 282.0 lb

## 2019-11-30 DIAGNOSIS — E786 Lipoprotein deficiency: Secondary | ICD-10-CM | POA: Diagnosis not present

## 2019-11-30 DIAGNOSIS — E559 Vitamin D deficiency, unspecified: Secondary | ICD-10-CM

## 2019-11-30 DIAGNOSIS — I10 Essential (primary) hypertension: Secondary | ICD-10-CM | POA: Diagnosis not present

## 2019-11-30 DIAGNOSIS — Z9189 Other specified personal risk factors, not elsewhere classified: Secondary | ICD-10-CM

## 2019-11-30 DIAGNOSIS — E8881 Metabolic syndrome: Secondary | ICD-10-CM

## 2019-11-30 DIAGNOSIS — Z6841 Body Mass Index (BMI) 40.0 and over, adult: Secondary | ICD-10-CM

## 2019-11-30 MED ORDER — VITAMIN D (ERGOCALCIFEROL) 1.25 MG (50000 UNIT) PO CAPS: 50000.0000 [IU] | ORAL_CAPSULE | ORAL | 0 refills | Status: DC

## 2019-12-03 NOTE — Progress Notes (Signed)
Office: [phone]  /  Fax: [phone]   HPI:   Chief Complaint: OBESITY Hector [name] is here to discuss his progress with his obesity treatment plan. He is on the Category 2 plan and is following his eating plan approximately 0 % of the time. He states he is exercising 0 minutes 0 times per week. Hector [name] has not started the diet yet. He has a very busy schedule working nights. He is drinking regular soda to stay awake. He states he is ready to get started next week.  His weight is 282 lb (127.9 kg) today and has not lost weight since his last visit. He has lost 2 lbs since starting treatment with [place].  Vitamin D Deficiency Hector [name] has a diagnosis of vitamin D deficiency. He is not currently taking Vit D. Last Vit D level was 17.8. He denies nausea, vomiting or muscle weakness.  Insulin Resistance Hector [name] has a diagnosis of insulin resistance based on his elevated fasting insulin level >5. Last insulin level was 35.9. Although Hector [name]'s blood glucose readings are still under good control, insulin resistance puts him at greater risk of metabolic syndrome and diabetes. He is not taking metformin currently and continues to work on diet and exercise to decrease risk of diabetes.  At risk for diabetes Hector [name] is at higher than average risk for developing diabetes due to his obesity and insulin resistance. He currently denies polyuria or polydipsia.  Low HDL Hector [name]'s HDL is low, and he is taking Lipitor. He admits to not taking it 2-3 times a week.  Hypertension Hector [name] is a 48 y.o. male with hypertension. Hector [name]'s blood pressure is at goal. He is taking losartan and denies chest pain. He is working on weight loss to help control his blood pressure with the goal of decreasing his risk of heart attack and stroke.   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 Hector [name] agrees to start prescription Vit D 50,000 IU every week #4 with no refills. He will follow up for routine testing of vitamin D, at least 2-3 times per year. He was informed of the risk of over-replacement of vitamin D. Hector [name] agrees to follow up with our clinic in 2 weeks.  Insulin Resistance Hector [name] will continue to work on weight loss, exercise, and decreasing simple carbohydrates in his diet to help decrease the risk of diabetes. Hector [name] agrees to follow up with [place] as directed to monitor his progress.  Diabetes risk counseling Hector [name] was given (~15 minutes) diabetes prevention counseling today. He is 48 y.o. male and has risk factors for diabetes including obesity and insulin resistance. We discussed intensive lifestyle modifications today with an emphasis on weight loss as well as increasing exercise and decreasing simple carbohydrates in his diet.  Low HDL Hector [name] is to restart his Lipitor as prescribed and we will continue to monitor. Hector [name] agrees to follow up with our clinic in 2 weeks.  Hypertension Hector [name] is working on healthy weight loss and exercise to improve blood pressure control. Hector [name] agreed with this plan and agreed to follow  up as directed. We will continue to monitor his blood pressure as well as his progress with the above lifestyle modifications. Hector [name] agrees to continue taking losartan, and will watch for signs of hypotension as he continues his lifestyle modifications. Hector [name] agrees to follow up with our clinic in 2 weeks.  Obesity Hector [name] is currently in the action stage of change. As such, his goal is to continue with weight loss efforts He has agreed to follow  the Category 2 plan Hector [name] has been instructed to work up to a goal of 150 minutes of combined cardio and strengthening exercise per week or as tolerated for weight loss and overall health benefits. We discussed the following Behavioral Modification Strategies today: increasing lean protein intake, decreasing simple carbohydrates, increasing vegetables, increase H20 intake, ways to avoid night time snacking and decrease liquid calories We reviewed easy shopping options. Hector [name] is to decrease soda and increase water.  Hector [name] has agreed to follow up with our clinic in 2 weeks. He was informed of the importance of frequent follow up visits to maximize his success with intensive lifestyle modifications for his multiple health conditions.  ALLERGIES: No Known Allergies  MEDICATIONS: Current Outpatient Medications on File Prior to Visit  Medication Sig Dispense Refill  . atorvastatin (LIPITOR) 40 MG tablet Take 1 tablet (40 mg total) by mouth daily. 90 tablet 3  . FLUoxetine (PROZAC) 10 MG capsule TAKE 1 CAPSULE BY MOUTH EVERY DAY 90 capsule 3  . LORazepam (ATIVAN) 1 MG tablet Take 1 tablet (1 mg total) by mouth every 8 (eight) hours as needed for anxiety. 30 tablet 0  . losartan (COZAAR) 50 MG tablet Take 1 tablet (50 mg total) by mouth daily. 30 tablet 3  . nebivolol (BYSTOLIC) 10 MG tablet TAKE 1 TABLET BY MOUTH ONCE DAILY. 90 tablet 3  . nitroGLYCERIN (NITROSTAT) 0.4 MG SL tablet Place 1 tablet (0.4 mg total) under the tongue every 5 (five) minutes as needed for chest pain. 20 tablet 0  . triamcinolone cream (KENALOG) 0.1 % Apply 1 application topically 2 (two) times daily as needed. 30 g 3   No current facility-administered medications on file prior to visit.     PAST MEDICAL HISTORY: Past Medical History:  Diagnosis Date  . Alcohol abuse   . Allergy   . Anal fissure   . Anxiety   . Asthma   . Back pain   . Bilateral swelling of feet   . Blood in stool   . Chest pain   . Depression   .  Drug use   . Febrile seizure ([place])   . Heartburn   . Hyperlipidemia   . Hypertension   . Irritable bowel syndrome with diarrhea   . Jaundice, newborn   . Palpitations   . Panic   . Panic disorder   . Pre-diabetes   . Seizures (HCC)    febrile  . Shortness of breath   . Sleep apnea     PAST SURGICAL HISTORY: Past Surgical History:  Procedure Laterality Date  . HEMORRHOID BANDING    . WISDOM TOOTH EXTRACTION  1991    SOCIAL HISTORY: Social History   Tobacco Use  . Smoking status: Never Smoker  . Smokeless tobacco: Never Used  Substance Use Topics  . Alcohol use: Yes    Alcohol/week: 0.0 standard drinks    Comment: 1-5 per week  . Drug use: Yes    Types: Marijuana    FAMILY HISTORY: Family History  Problem Relation Age  of Onset  . Dementia Mother   . Stroke Mother   . Hypertension Mother   . Skin cancer Mother   . Basal cell carcinoma Mother   . Squamous cell carcinoma Mother   . Cancer Mother   . Depression Mother   . Diabetes Father   . Hyperlipidemia Father   . Stroke Father   . Heart disease Father 109  . Colon polyps Father   . Heart attack Father   . Hypertension Father   . Depression Father   . Sleep apnea Father   . Obesity Father   . Colitis Brother   . Colon polyps Brother     ROS: Review of Systems  Constitutional: Negative for weight loss.  Cardiovascular: Negative for chest pain.  Gastrointestinal: Negative for nausea and vomiting.  Genitourinary: Negative for frequency.  Musculoskeletal:       Negative muscle weakness  Endo/Heme/Allergies: Negative for polydipsia.    PHYSICAL EXAM: Blood pressure 132/77, pulse 63, temperature (!) 97.4 F (36.3 C), temperature source Oral, height 5' 9"  (1.753 m), weight 282 lb (127.9 kg), SpO2 97 %. Body mass index is 41.64 kg/m. Physical Exam Vitals signs reviewed.  Constitutional:      Appearance: Normal appearance. He is obese.  Cardiovascular:     Rate and Rhythm: Normal rate.      Pulses: Normal pulses.  Pulmonary:     Effort: Pulmonary effort is normal.     Breath sounds: Normal breath sounds.  Musculoskeletal: Normal range of motion.  Skin:    General: Skin is warm and dry.  Neurological:     Mental Status: He is alert and oriented to person, place, and time.  Psychiatric:        Mood and Affect: Mood normal.        Behavior: Behavior normal.     RECENT LABS AND TESTS: BMET    Component Value Date/Time   NA 143 11/16/2019 1007   K 4.5 11/16/2019 1007   CL 105 11/16/2019 1007   CO2 25 11/16/2019 1007   GLUCOSE 91 11/16/2019 1007   GLUCOSE 89 06/12/2019 1059   BUN 18 11/16/2019 1007   CREATININE 0.98 11/16/2019 1007   CALCIUM 9.2 11/16/2019 1007   GFRNONAA 91 11/16/2019 1007   GFRAA 105 11/16/2019 1007   Lab Results  Component Value Date   HGBA1C 5.2 11/16/2019   Lab Results  Component Value Date   INSULIN 35.9 (H) 11/16/2019   CBC    Component Value Date/Time   WBC 6.5 11/16/2019 1007   WBC 7.6 04/13/2018 1413   RBC 5.08 11/16/2019 1007   RBC 5.13 04/13/2018 1413   HGB 15.5 11/16/2019 1007   HCT 44.8 11/16/2019 1007   PLT 150 11/16/2019 1007   MCV 88 11/16/2019 1007   MCH 30.5 11/16/2019 1007   MCH 29.7 05/27/2015 1050   MCHC 34.6 11/16/2019 1007   MCHC 35.0 04/13/2018 1413   RDW 12.6 11/16/2019 1007   LYMPHSABS 1.8 11/16/2019 1007   MONOABS 0.6 04/13/2018 1413   EOSABS 0.3 11/16/2019 1007   BASOSABS 0.1 11/16/2019 1007   Iron/TIBC/Ferritin/ %Sat No results found for: IRON, TIBC, FERRITIN, IRONPCTSAT Lipid Panel     Component Value Date/Time   CHOL 136 11/16/2019 1007   TRIG 105 11/16/2019 1007   HDL 35 (L) 11/16/2019 1007   CHOLHDL 3 06/12/2019 1059   VLDL 12.2 06/12/2019 1059   LDLCALC 81 11/16/2019 1007   Hepatic Function Panel     Component  Value Date/Time   PROT 6.5 11/16/2019 1007   ALBUMIN 4.3 11/16/2019 1007   AST 18 11/16/2019 1007   ALT 21 11/16/2019 1007   ALKPHOS 89 11/16/2019 1007   BILITOT 0.3  11/16/2019 1007   BILIDIR 0.2 06/12/2019 1059      Component Value Date/Time   TSH 2.410 11/16/2019 1007   TSH 2.02 04/13/2018 1413   TSH 1.78 02/10/2016 1050      OBESITY BEHAVIORAL INTERVENTION VISIT  Today's visit was # 2   Starting weight: 282 lbs Starting date: 11/16/2019 Today's weight : 282 lbs  Today's date: 11/30/2019 Total lbs lost to date: 0    ASK: We discussed the diagnosis of obesity with Hector [name] today and Hector [name] agreed to give [place] permission to discuss obesity behavioral modification therapy today.  ASSESS: Hector [name] has the diagnosis of obesity and his BMI today is 41.63 Hector [name] is in the action stage of change   ADVISE: Hector [name] was educated on the multiple health risks of obesity as well as the benefit of weight loss to improve his health. He was advised of the need for long term treatment and the importance of lifestyle modifications to improve his current health and to decrease his risk of future health problems.  AGREE: Multiple dietary modification options and treatment options were discussed and  Hector [name] agreed to follow the recommendations documented in the above note.  ARRANGE: Hector [name] was educated on the importance of frequent visits to treat obesity as outlined per CMS and USPSTF guidelines and agreed to schedule his next follow up appointment today.  Wilhemena Durie, am acting as transcriptionist for Briscoe Deutscher, DO  I have reviewed the above documentation for accuracy and completeness, and I agree with the above. Briscoe Deutscher, DO

## 2019-12-04 ENCOUNTER — Encounter (INDEPENDENT_AMBULATORY_CARE_PROVIDER_SITE_OTHER): Payer: Self-pay | Admitting: Family Medicine

## 2019-12-08 ENCOUNTER — Encounter (INDEPENDENT_AMBULATORY_CARE_PROVIDER_SITE_OTHER): Payer: Self-pay | Admitting: Family Medicine

## 2019-12-11 NOTE — Telephone Encounter (Signed)
Please advise 

## 2019-12-14 ENCOUNTER — Other Ambulatory Visit: Payer: Self-pay

## 2019-12-14 ENCOUNTER — Ambulatory Visit (INDEPENDENT_AMBULATORY_CARE_PROVIDER_SITE_OTHER): Payer: BC Managed Care – PPO | Admitting: Family Medicine

## 2019-12-14 ENCOUNTER — Encounter (INDEPENDENT_AMBULATORY_CARE_PROVIDER_SITE_OTHER): Payer: Self-pay | Admitting: Family Medicine

## 2019-12-14 VITALS — BP 126/80 | HR 56 | Temp 98.1°F | Ht 69.0 in | Wt 274.0 lb

## 2019-12-14 DIAGNOSIS — E8881 Metabolic syndrome: Secondary | ICD-10-CM

## 2019-12-14 DIAGNOSIS — I10 Essential (primary) hypertension: Secondary | ICD-10-CM | POA: Diagnosis not present

## 2019-12-14 DIAGNOSIS — Z6841 Body Mass Index (BMI) 40.0 and over, adult: Secondary | ICD-10-CM

## 2019-12-14 DIAGNOSIS — Z9189 Other specified personal risk factors, not elsewhere classified: Secondary | ICD-10-CM

## 2019-12-14 DIAGNOSIS — E559 Vitamin D deficiency, unspecified: Secondary | ICD-10-CM

## 2019-12-14 DIAGNOSIS — E785 Hyperlipidemia, unspecified: Secondary | ICD-10-CM | POA: Diagnosis not present

## 2019-12-14 MED ORDER — VITAMIN D (ERGOCALCIFEROL) 1.25 MG (50000 UNIT) PO CAPS: 50000.0000 [IU] | ORAL_CAPSULE | ORAL | 0 refills | Status: DC

## 2019-12-15 ENCOUNTER — Other Ambulatory Visit: Payer: Self-pay | Admitting: Family Medicine

## 2019-12-19 NOTE — Progress Notes (Signed)
Office: [phone]  /  Fax: [phone]   HPI:  Chief Complaint: OBESITY Hector [name] is here to discuss his progress with his obesity treatment plan. He is on the Category 2 plan and states he is following his eating plan approximately 80% of the time. He states he is exercising 0 minutes 0 times per week.  Hector [name] was able to start the program. He stopped sodas, but he is still craving them. He is adhering to his plan 80% of the time. He gives himself a cheat day. He is getting tired of lunch and dinner options. His previous habits included a lot of fast food.  Today's visit was # 3  Starting weight: 282 lbs Starting date: 11/16/2019 Today's weight : 274 lbs Today's date: 12/14/2019 Total lbs lost to date: 8 Total lbs lost since last in-office visit: 8  Vitamin D Deficiency Hector [name] has a diagnosis of vitamin D deficiency. He is currently taking prescription Vit D.  Dyslipidemia Hector [name] has a diagnosis of dyslipidemia. He is currently taking Lipitor.  Lab Results  Component Value Date   CHOL 136 11/16/2019   HDL 35 (L) 11/16/2019   LDLCALC 81 11/16/2019   TRIG 105 11/16/2019   CHOLHDL 3 06/12/2019   Lab Results  Component Value Date   ALT 21 11/16/2019   AST 18 11/16/2019   ALKPHOS 89 11/16/2019   BILITOT 0.3 11/16/2019   The 10-year ASCVD risk score Hector Bussing DC Jr., et al., 2013) is: 2.7%   Values used to calculate the score:     Age: 48 years     Sex: Male     Is Non-Hispanic African American: No     Diabetic: No     Tobacco smoker: No     Systolic Blood Pressure: 262 mmHg     Is BP treated: Yes     HDL Cholesterol: 35 mg/dL     Total Cholesterol: 136 mg/dL  Hypertension Hector [name] has a diagnosis of hypertension. He is currently taking Losartan and his blood pressure is at goal.   BP Readings from Last 3 Encounters:  12/14/19 126/80  11/30/19 132/77  11/16/19 134/72   Insulin Resistance Hector [name] has a diagnosis of insulin resistance. He is not on any medications.  At risk  for diabetes Hector [name] is at higher than average risk for developing diabetes due to his obesity and insulin resistance.   ASSESSMENT AND PLAN:  Vitamin D deficiency - Plan: Vitamin D, Ergocalciferol, (DRISDOL) 1.25 MG (50000 UT) CAPS capsule  Essential hypertension  Insulin resistance  Dyslipidemia  At risk for diabetes mellitus  Class 3 severe obesity with serious comorbidity and body mass index (BMI) of 40.0 to 44.9 in adult, unspecified obesity type (HCC)  PLAN:  Vitamin D Deficiency Low vitamin D level contributes to fatigue and are associated with obesity, breast, and colon cancer. [name] agrees to continue taking prescription Vit D 50,000 IU every week #4 and we will refill for 1 month. He will follow up for routine testing of vitamin D, at least 2-3 times per year to avoid over-replacement. We will continue to monitor.  Dyslipidemia We will continue to monitor.  Hypertension Birl is working on healthy weight loss and exercise to improve blood pressure control. We will watch for signs of hypotension as he continues his lifestyle modifications. We will continue to monitor.  Insulin Resistance [name] will continue to work on weight loss, exercise, and decreasing simple carbohydrates to help decrease the risk of diabetes. [name] agrees to follow up with  [place] as directed to closely monitor his progress.  Diabetes risk counseling (~15 min) Hector [name] is a 48 y.o. male and has risk factors for diabetes including obesity and insulin resistance. We discussed intensive lifestyle modifications today with an emphasis on weight loss as well as increasing exercise and decreasing simple carbohydrates in his diet.  Obesity Hector [name] is currently in the action stage of change. As such, his goal is to continue with weight loss efforts. He has agreed to follow the Category 2 plan. Hector [name] has been instructed to work up to a goal of 150 minutes of combined cardio and strengthening exercise per week for weight loss  and overall health benefits. We discussed the following Behavioral Modification Strategies today: increasing lean protein intake, work on meal planning and easy cooking plans, holiday eating strategies, and planning for success  Handouts were given: Eating out guide, Lunch alternative, and Microwave meals.  Hector [name] has agreed to follow up with our clinic in 3 weeks. He was informed of the importance of frequent follow up visits to maximize his success with intensive lifestyle modifications for his multiple health conditions.  ALLERGIES: No Known Allergies  MEDICATIONS: Current Outpatient Medications on File Prior to Visit  Medication Sig Dispense Refill  . atorvastatin (LIPITOR) 40 MG tablet Take 1 tablet (40 mg total) by mouth daily. 90 tablet 3  . FLUoxetine (PROZAC) 10 MG capsule TAKE 1 CAPSULE BY MOUTH EVERY DAY 90 capsule 3  . LORazepam (ATIVAN) 1 MG tablet Take 1 tablet (1 mg total) by mouth every 8 (eight) hours as needed for anxiety. 30 tablet 0  . losartan (COZAAR) 50 MG tablet Take 1 tablet (50 mg total) by mouth daily. 30 tablet 3  . nitroGLYCERIN (NITROSTAT) 0.4 MG SL tablet Place 1 tablet (0.4 mg total) under the tongue every 5 (five) minutes as needed for chest pain. 20 tablet 0  . triamcinolone cream (KENALOG) 0.1 % Apply 1 application topically 2 (two) times daily as needed. 30 g 3   No current facility-administered medications on file prior to visit.    PAST MEDICAL HISTORY: Past Medical History:  Diagnosis Date  . Alcohol abuse   . Allergy   . Anal fissure   . Anxiety   . Asthma   . Back pain   . Bilateral swelling of feet   . Blood in stool   . Chest pain   . Depression   . Drug use   . Febrile seizure ([place])   . Heartburn   . Hyperlipidemia   . Hypertension   . Irritable bowel syndrome with diarrhea   . Jaundice, newborn   . Palpitations   . Panic   . Panic disorder   . Pre-diabetes   . Seizures (HCC)    febrile  . Shortness of breath   . Sleep apnea       PAST SURGICAL HISTORY: Past Surgical History:  Procedure Laterality Date  . HEMORRHOID BANDING    . WISDOM TOOTH EXTRACTION  1991    SOCIAL HISTORY: Social History   Tobacco Use  . Smoking status: Never Smoker  . Smokeless tobacco: Never Used  Substance Use Topics  . Alcohol use: Yes    Alcohol/week: 0.0 standard drinks    Comment: 1-5 per week  . Drug use: Yes    Types: Marijuana    FAMILY HISTORY: Family History  Problem Relation Age of Onset  . Dementia Mother   . Stroke Mother   . Hypertension Mother   . Skin  cancer Mother   . Basal cell carcinoma Mother   . Squamous cell carcinoma Mother   . Cancer Mother   . Depression Mother   . Diabetes Father   . Hyperlipidemia Father   . Stroke Father   . Heart disease Father 57  . Colon polyps Father   . Heart attack Father   . Hypertension Father   . Depression Father   . Sleep apnea Father   . Obesity Father   . Colitis Brother   . Colon polyps Brother     ROS: Review of Systems  Constitutional: Positive for weight loss.    PHYSICAL EXAM: Blood pressure 126/80, pulse (!) 56, temperature 98.1 F (36.7 C), temperature source Oral, height 5' 9"  (1.753 m), weight 274 lb (124.3 kg), SpO2 96 %. Body mass index is 40.46 kg/m. Physical Exam Vitals reviewed.  Constitutional:      Appearance: Normal appearance. He is obese.  Cardiovascular:     Rate and Rhythm: Normal rate.     Pulses: Normal pulses.  Pulmonary:     Effort: Pulmonary effort is normal.     Breath sounds: Normal breath sounds.  Musculoskeletal:        General: Normal range of motion.  Skin:    General: Skin is warm and dry.  Neurological:     Mental Status: He is alert and oriented to person, place, and time.  Psychiatric:        Mood and Affect: Mood normal.        Behavior: Behavior normal.     RECENT LABS AND TESTS: BMET    Component Value Date/Time   NA 143 11/16/2019 1007   K 4.5 11/16/2019 1007   CL 105 11/16/2019 1007    CO2 25 11/16/2019 1007   GLUCOSE 91 11/16/2019 1007   GLUCOSE 89 06/12/2019 1059   BUN 18 11/16/2019 1007   CREATININE 0.98 11/16/2019 1007   CALCIUM 9.2 11/16/2019 1007   GFRNONAA 91 11/16/2019 1007   GFRAA 105 11/16/2019 1007   Lab Results  Component Value Date   HGBA1C 5.2 11/16/2019   Lab Results  Component Value Date   INSULIN 35.9 (H) 11/16/2019   CBC    Component Value Date/Time   WBC 6.5 11/16/2019 1007   WBC 7.6 04/13/2018 1413   RBC 5.08 11/16/2019 1007   RBC 5.13 04/13/2018 1413   HGB 15.5 11/16/2019 1007   HCT 44.8 11/16/2019 1007   PLT 150 11/16/2019 1007   MCV 88 11/16/2019 1007   MCH 30.5 11/16/2019 1007   MCH 29.7 05/27/2015 1050   MCHC 34.6 11/16/2019 1007   MCHC 35.0 04/13/2018 1413   RDW 12.6 11/16/2019 1007   LYMPHSABS 1.8 11/16/2019 1007   MONOABS 0.6 04/13/2018 1413   EOSABS 0.3 11/16/2019 1007   BASOSABS 0.1 11/16/2019 1007   Iron/TIBC/Ferritin/ %Sat No results found for: IRON, TIBC, FERRITIN, IRONPCTSAT Lipid Panel     Component Value Date/Time   CHOL 136 11/16/2019 1007   TRIG 105 11/16/2019 1007   HDL 35 (L) 11/16/2019 1007   CHOLHDL 3 06/12/2019 1059   VLDL 12.2 06/12/2019 1059   LDLCALC 81 11/16/2019 1007   Hepatic Function Panel     Component Value Date/Time   PROT 6.5 11/16/2019 1007   ALBUMIN 4.3 11/16/2019 1007   AST 18 11/16/2019 1007   ALT 21 11/16/2019 1007   ALKPHOS 89 11/16/2019 1007   BILITOT 0.3 11/16/2019 1007   BILIDIR 0.2 06/12/2019 1059  Component Value Date/Time   TSH 2.410 11/16/2019 1007   TSH 2.02 04/13/2018 1413   TSH 1.78 02/10/2016 1050    I, Trixie Dredge, am acting as Location manager for Briscoe Deutscher, DO  I have reviewed the above documentation for accuracy and completeness, and I agree with the above. Briscoe Deutscher, DO

## 2020-01-09 ENCOUNTER — Other Ambulatory Visit: Payer: Self-pay

## 2020-01-09 ENCOUNTER — Ambulatory Visit (INDEPENDENT_AMBULATORY_CARE_PROVIDER_SITE_OTHER): Payer: 59 | Admitting: Family Medicine

## 2020-01-09 VITALS — BP 121/70 | HR 57 | Temp 98.1°F | Ht 69.0 in | Wt 277.0 lb

## 2020-01-09 DIAGNOSIS — E559 Vitamin D deficiency, unspecified: Secondary | ICD-10-CM

## 2020-01-09 DIAGNOSIS — I10 Essential (primary) hypertension: Secondary | ICD-10-CM | POA: Diagnosis not present

## 2020-01-09 DIAGNOSIS — Z9189 Other specified personal risk factors, not elsewhere classified: Secondary | ICD-10-CM

## 2020-01-09 DIAGNOSIS — Z6841 Body Mass Index (BMI) 40.0 and over, adult: Secondary | ICD-10-CM

## 2020-01-09 DIAGNOSIS — E8881 Metabolic syndrome: Secondary | ICD-10-CM | POA: Diagnosis not present

## 2020-01-09 DIAGNOSIS — E785 Hyperlipidemia, unspecified: Secondary | ICD-10-CM | POA: Diagnosis not present

## 2020-01-09 DIAGNOSIS — F3289 Other specified depressive episodes: Secondary | ICD-10-CM

## 2020-01-09 MED ORDER — VITAMIN D (ERGOCALCIFEROL) 1.25 MG (50000 UNIT) PO CAPS: 50000.0000 [IU] | ORAL_CAPSULE | ORAL | 0 refills | Status: DC

## 2020-01-10 ENCOUNTER — Encounter (INDEPENDENT_AMBULATORY_CARE_PROVIDER_SITE_OTHER): Payer: Self-pay | Admitting: Family Medicine

## 2020-01-10 ENCOUNTER — Other Ambulatory Visit: Payer: Self-pay | Admitting: Family Medicine

## 2020-01-10 NOTE — Progress Notes (Signed)
Chief Complaint:   OBESITY Hector [name] is here to discuss his progress with his obesity treatment plan along with follow-up of his obesity related diagnoses. Hector [name] is on the Category 2 Plan and states he is following his eating plan approximately 20% of the time. Hector [name] states he is exercising 0 minutes 0 times per week.  Today's visit was #: 4 Starting weight: 282 lbs Starting date: 11/16/2019 Today's weight: 277 lbs Today's date: 01/09/2020 Total lbs lost to date: 5 lbs Total lbs lost since last in-office visit: 0  Interim History: Hector [name] says he was off the plan during the holidays.  He is ready to get back to the plan.  Subjective:   1. Essential hypertension Taking Losartan and Bystolic for blood pressure control. Cardiovascular ROS: no chest pain or dyspnea on exertion.  BP Readings from Last 3 Encounters:  01/09/20 121/70  12/14/19 126/80  11/30/19 132/77   2. Insulin resistance Hector [name] has a diagnosis of insulin resistance based on his elevated fasting insulin level >5. He continues to work on diet and exercise to decrease his risk of diabetes.  Lab Results  Component Value Date   INSULIN 35.9 (H) 11/16/2019   3. Vitamin D deficiency Vitamin D level was 17.8 on 11-16-2019. He is currently taking vit D. He denies nausea, vomiting or muscle weakness.  4. Dyslipidemia Hector [name] is currently taking Lipitor.  No myalgias.  Lab Results  Component Value Date   ALT 21 11/16/2019   AST 18 11/16/2019   ALKPHOS 89 11/16/2019   BILITOT 0.3 11/16/2019   Lab Results  Component Value Date   CHOL 136 11/16/2019   HDL 35 (L) 11/16/2019   LDLCALC 81 11/16/2019   TRIG 105 11/16/2019   CHOLHDL 3 06/12/2019   5. Other depression, emotional eating Hector [name] is struggling with emotional eating and using food for comfort to the extent that it is negatively impacting his health. He has been working on behavior modification techniques to help reduce his emotional eating and has been  unsuccessful. He shows no sign of suicidal or homicidal ideations.  He is currently taking Prozac.  6. At risk for heart disease Hector [name] is at a higher than average risk for cardiovascular disease due to obesity. Reviewed: no chest pain on exertion, no dyspnea on exertion, and no swelling of ankles.  Assessment/Plan:   1. Essential hypertension Hector [name] is working on healthy weight loss and exercise to improve blood pressure control. We will watch for signs of hypotension as he continues his lifestyle modifications.  2. Insulin resistance Hector [name] will continue to work on weight loss, exercise, and decreasing simple carbohydrates to help decrease the risk of diabetes. Hector [name] agreed to follow-up with [place] as directed to closely monitor his progress.  3. Vitamin D deficiency Low Vitamin D level contributes to fatigue and are associated with obesity, breast, and colon cancer. He agrees to continue to take prescription Vitamin D @50 ,000 IU every week and will follow-up for routine testing of vitamin D, at least 2-3 times per year to avoid over-replacement.  Orders - Vitamin D, Ergocalciferol, (DRISDOL) 1.25 MG (50000 UNIT) CAPS capsule; Take 1 capsule (50,000 Units total) by mouth every 7 (seven) days.  Dispense: 4 capsule; Refill: 0  4. Dyslipidemia Cardiovascular risk and specific lipid/LDL goals reviewed.  We discussed several lifestyle modifications today and Hector [name] will continue to work on diet, exercise and weight loss efforts. Orders and follow up as documented in patient record.   Counseling Intensive lifestyle  modifications are the first line treatment for this issue. . Dietary changes: Increase soluble fiber. Decrease simple carbohydrates. . Exercise changes: Moderate to vigorous-intensity aerobic activity 150 minutes per week if tolerated. . Lipid-lowering medications: see documented in medical record.  5. Other depression, emotional eating Patient was referred to Dr. Mallie Mussel, our Bariatric  Psychologist, for evaluation due to his elevated PHQ-9 score and significant struggles with emotional eating.  6. At risk for heart disease Hector [name] was given approximately 15 minutes of coronary artery disease prevention counseling today. He is 49 y.o. male and has risk factors for heart disease including obesity. We discussed intensive lifestyle modifications today with an emphasis on specific weight loss instructions and strategies.   7. Class 3 severe obesity with serious comorbidity and body mass index (BMI) of 40.0 to 44.9 in adult, unspecified obesity type (HCC) Hector [name] is currently in the action stage of change. As such, his goal is to continue with weight loss efforts. He has agreed to on the Category 2 Plan.   We discussed the following exercise goals today: For substantial health benefits, adults should do at least 150 minutes (2 hours and 30 minutes) a week of moderate-intensity, or 75 minutes (1 hour and 15 minutes) a week of vigorous-intensity aerobic physical activity, or an equivalent combination of moderate- and vigorous-intensity aerobic activity. Aerobic activity should be performed in episodes of at least 10 minutes, and preferably, it should be spread throughout the week. Adults should also include muscle-strengthening activities that involve all major muscle groups on 2 or more days a week.  We discussed the following behavioral modification strategies today: increasing lean protein intake, decreasing simple carbohydrates, increasing vegetables, increasing water intake and decreasing liquid calories.  Hector [name] has agreed to follow-up with our clinic in 2 weeks. He was informed of the importance of frequent follow-up visits to maximize his success with intensive lifestyle modifications for his multiple health conditions.   Objective:   Blood pressure 121/70, pulse (!) 57, temperature 98.1 F (36.7 C), temperature source Oral, height 5' 9"  (1.753 m), weight 277 lb (125.6 kg), SpO2  97 %. Body mass index is 40.91 kg/m.  General: Cooperative, alert, well developed, in no acute distress. HEENT: Conjunctivae and lids unremarkable. Neck: No thyromegaly.  Cardiovascular: Regular rhythm.  Lungs: Normal work of breathing. Extremities: No edema.  Neurologic: No focal deficits.   Lab Results  Component Value Date   CREATININE 0.98 11/16/2019   BUN 18 11/16/2019   NA 143 11/16/2019   K 4.5 11/16/2019   CL 105 11/16/2019   CO2 25 11/16/2019   Lab Results  Component Value Date   ALT 21 11/16/2019   AST 18 11/16/2019   ALKPHOS 89 11/16/2019   BILITOT 0.3 11/16/2019   Lab Results  Component Value Date   HGBA1C 5.2 11/16/2019   Lab Results  Component Value Date   INSULIN 35.9 (H) 11/16/2019   Lab Results  Component Value Date   TSH 2.410 11/16/2019   Lab Results  Component Value Date   CHOL 136 11/16/2019   HDL 35 (L) 11/16/2019   LDLCALC 81 11/16/2019   TRIG 105 11/16/2019   CHOLHDL 3 06/12/2019   Lab Results  Component Value Date   WBC 6.5 11/16/2019   HGB 15.5 11/16/2019   HCT 44.8 11/16/2019   MCV 88 11/16/2019   PLT 150 11/16/2019   Attestation Statements:   Reviewed by clinician on day of visit: allergies, medications, problem list, medical history, surgical history, family  history, social history, and previous encounter notes.  I, Water quality scientist, CMA, am acting as Location manager for [employer], DO.  I have reviewed the above documentation for accuracy and completeness, and I agree with the above. Briscoe Deutscher, DO

## 2020-01-24 ENCOUNTER — Encounter (INDEPENDENT_AMBULATORY_CARE_PROVIDER_SITE_OTHER): Payer: Self-pay

## 2020-01-25 NOTE — Progress Notes (Signed)
Office: [phone]  /  Fax: [phone]    Date: January 30, 2020   Appointment Start Time: 11:02am Duration: 63 minutes Provider: Glennie Isle, Psy.D. Type of Session: Intake for Individual Therapy  Location of Patient: Home Location of Provider: Provider's Home Type of Contact: Telepsychological Visit via [employer]  Informed Consent: Prior to proceeding with today's appointment, two pieces of identifying information were obtained. In addition, Odai's physical location at the time of this appointment was obtained as well a phone number he could be reached at in the event of technical difficulties. Makhari and this provider participated in today's telepsychological service.   The provider's role was explained to [employer]. The provider reviewed and discussed issues of confidentiality, privacy, and limits therein (e.g., reporting obligations). In addition to verbal informed consent, written informed consent for psychological services was obtained prior to the initial appointment. Since the clinic is not a 24/7 crisis center, mental health emergency resources were shared and this  provider explained MyChart, e-mail, voicemail, and/or other messaging systems should be utilized only for non-emergency reasons. This provider also explained that information obtained during appointments will be placed in Montez's medical record and relevant information will be shared with other providers at [facility] for coordination of care. Moreover, Roque agreed information may be shared with other [facility] providers as needed for coordination of care. By signing the service agreement document, Kratos provided written consent for coordination of care. Prior to initiating telepsychological services, Sevrin completed an informed consent document, which included the development of a safety plan (i.e., an emergency contact, nearest emergency room, and emergency resources) in the event of an  emergency/crisis. Nachman expressed understanding of the rationale of the safety plan. Jerek verbally acknowledged understanding he is ultimately responsible for understanding his insurance benefits for telepsychological and in-person services. This provider also reviewed confidentiality, as it relates to telepsychological services, as well as the rationale for telepsychological services (i.e., to reduce exposure risk to COVID-19). Basem  acknowledged understanding that appointments cannot be recorded without both party consent and he is aware he is responsible for securing confidentiality on his end of the session. Akio verbally consented to proceed.  Chief Complaint/HPI: Kelan was referred by Dr. Briscoe Deutscher due to depression with emotional eating behaviors. Per the note for the visit with Dr. Briscoe Deutscher on January 09, 2020, "Roy is struggling with emotional eating and using food for comfort to the extent that it is negatively impacting his health. He has been working on behavior modification techniques to help reduce his emotional eating and has been unsuccessful. He shows no sign of suicidal or homicidal ideations. He is currently taking Prozac." During the initial appointment with Dr. Briscoe Deutscher at [facility] on November 16, 2019, Perfecto reported experiencing the following: significant food cravings issues , snacking frequently in the evenings, frequently drinking liquids with calories, frequently making poor food choices, frequently eating larger portions than normal , struggling with emotional eating and skipping meals frequently. Aubrey's Food and Mood (modified PHQ-9) score on November 16, 2019 was 15.  During today's appointment, Braven shared, "I'm addicted to basically sugar and certain types of foods." He stated he craves soda, noting he is working toward reducing soda intake in the past few months. Currently, he consumes 12-20 oz of soda daily, adding he has gone some days without it.  Cezar noted he was previously consuming 40 oz of soda daily. He was verbally administered a questionnaire assessing  various behaviors related to emotional eating. Dina endorsed the following: experience food cravings on a regular basis, eat certain foods when you are anxious, stressed, depressed, or your feelings are hurt, use food to help you cope with emotional situations, find food is comforting to you, overeat frequently when you are bored or lonely, overeat when you are alone, but eat much less when you are with other people and eat as a reward. Indalecio believes the onset of emotional eating was likely around his mid to late 37s and described the current frequency of emotional eating as "maybe every other day." In addition, Hervey denied a history of binge eating; however, he described eating larger portions of foods he enjoys. Garrin denied a history of restricting food intake, purging and engagement in other compensatory strategies, and has never been diagnosed with an eating disorder. He also denied a history of treatment for emotional eating. Moreover, Taysean indicated stress, lack of will power, and procrastination triggers emotional eating, whereas feeling in control of eating habits, feeling proud, and seeing results makes emotional eating better. Furthermore, Thurmond Butts discussed concern about his health and worry about not being here for his daughter. He also discussed "feeling bad" about separating from his daughter's mother resulting in his daughter having to move between each parent.   Mental Status Examination:  Appearance: well groomed and appropriate hygiene  Behavior: appropriate to circumstances Mood: euthymic Affect: mood congruent Speech: normal in rate, volume, and tone Eye Contact: appropriate Psychomotor Activity: appropriate Gait: unable to assess Thought Process: linear, logical, and goal directed  Thought Content/Perception: denies suicidal and homicidal ideation, plan, and intent and no  hallucinations, delusions, bizarre thinking or behavior reported or observed Orientation: time, person, place and purpose of appointment Memory/Concentration: memory, attention, language, and fund of knowledge intact  Insight/Judgment: good  Family & Psychosocial History: Jandel reported he is currently separated and he has a daughter (age 48). 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 Additionally, Errin shared his highest level of education obtained is a bachelor's degree. Currently, Traver's social support system consists of his sister and a couple friends. Moreover, Bach stated he resides with his daughter (3 nights a week) and three cats.   Medical History:  Past Medical History:  Diagnosis Date  . Alcohol abuse   . Allergy   . Anal fissure   . Anxiety   . Asthma   . Back pain   . Bilateral swelling of feet   . Blood in stool   . Chest pain   . Depression   . Drug use   . Febrile seizure ([place])   . Heartburn   . Hyperlipidemia   . Hypertension   . Irritable bowel syndrome with diarrhea   . Jaundice, newborn   . Palpitations   . Panic   . Panic disorder   . Pre-diabetes   . Seizures ([facility])    febrile  . Shortness of breath   . Sleep apnea    Past Surgical History:  Procedure Laterality Date  . HEMORRHOID BANDING    . WISDOM TOOTH EXTRACTION  1991   Current Outpatient Medications on File Prior to Visit  Medication Sig Dispense Refill  . atorvastatin (LIPITOR) 40 MG tablet Take 1 tablet (40 mg total) by mouth daily. 90 tablet 3  . FLUoxetine (PROZAC) 10 MG capsule TAKE 1 CAPSULE BY MOUTH EVERY DAY 90 capsule 3  . LORazepam (ATIVAN) 1 MG tablet Take 1 tablet (1 mg total) by  mouth every 8 (eight) hours as needed for anxiety. 30 tablet 0  . losartan (COZAAR) 50 MG tablet Take 1 tablet (50 mg total) by mouth daily. Please schedule follow up for further refills. [phone] 30 tablet 0  . nebivolol (BYSTOLIC) 10 MG tablet TAKE 1 TABLET BY MOUTH  ONCE DAILY. NEED APPT FOR REFILLS 90 tablet 1  . nitroGLYCERIN (NITROSTAT) 0.4 MG SL tablet Place 1 tablet (0.4 mg total) under the tongue every 5 (five) minutes as needed for chest pain. 20 tablet 0  . triamcinolone cream (KENALOG) 0.1 % Apply 1 application topically 2 (two) times daily as needed. 30 g 3  . Vitamin D, Ergocalciferol, (DRISDOL) 1.25 MG (50000 UNIT) CAPS capsule Take 1 capsule (50,000 Units total) by mouth every 7 (seven) days. 4 capsule 0   No current facility-administered medications on file prior to visit.  Shafter reported 3-4 years ago he "passed out" at a music festival and hit his head. He is unsure what caused him to pass out, but believes it was a heat stroke. Tyreak stated there was LOC for an unknown period and he received medical attention.   Mental Health History: Barney stated he first received therapeutic services around 2001-2002 and was diagnosed with panic disorder. He recalled he was prescribed Paxil and it was later switched to Prozac. He reported he attended therapy again to address panic disorder. He last attended therapeutic services in 2012-2013. Stephens reported there is no history of hospitalizations for psychiatric concerns. He believes he previously met with a psychiatrist. Thurmond Butts shared his PCP currently prescribes Ativan PRN and Prozac. Babak endorsed a family history of mental health related concerns. He shared his father had a "nervous breakdown" in his 35s and his mother suffered from delusions and was later diagnosed with vascular dementia. During childhood, Brason recalled his father had him do things that made him feel "uncomfortable." More specifically, he noted his father had him shower with him until he was about 12 years ago. He stated it was never reported and his father is deceased. He denied a history of physical and psychological abuse, as well as neglect.   Cavion recalled in his 81s and 55s having suicidal ideation. He stated having the thought of taking  substances, turning on music, and slitting his wrist in the bath tub. However, he denied ever experiencing suicidal intent. Carmino denied a history of suicidal gestures or attempts. He noted he last experienced suicidal ideation 15-20 years ago; however, he discussed feeling "tired" of working, parenting stressors, financial concerns, and other day to day stressors. He added, "I'm afraid of dying." The following protective factors were identified for Latravious: daughter, self, music, desire to enjoy foods/drinks, [place] basketball and football, and "routine of daily life." He added, "Life is a gift and I wouldn't want to throw it away." If he were to become overwhelmed, depressed, and anxious in the future, which are signs that a crisis may occur, he identified the following coping skills he could engage in: breathing exercise, stepping away from stressor to reset, and engaging in self-talk. It was recommended the aforementioned be written down and developed into a coping card for future reference. Psychoeducation regarding the importance of reaching out to a trusted individual and/or utilizing emergency resources if there is a change in emotional status and/or there is an inability to ensure safety was provided. He expressed confidence that he would reach out to a trusted individual and/or utilize emergency resources. Additionally, Deborah denied current access to firearms and/or  weapons.   Burney described his typical mood lately as fluctuating depending on situations. Aside from concerns noted above and endorsed on the PHQ-9 and GAD-7, Eyal reported experiencing financial stressors; feeling stuck at his work place; social withdrawal; decreased motivation; and panic attacks. He stated he currently experiences panic attacks once every 2-3 months, noting he is able to cope. He also discussed feeling hopeless. He clarified he is not experiencing suicidal ideation, rather, "This is my life. Maybe being alone. Money problems."  Additionally, he endorsed memory concerns (e.g., trouble remembering names and naming objects). He was encouraged to discuss this further with his PCP. Avis endorsed current alcohol use. He stated he consumes alcohol once a month in the form of 1-6 standard beers over the course of 3-4 hours. He denied tobacco use. He disclosed a history of substance use, including mushrooms, cocaine, LSD, cough syrup, pills, and ecstasy, adding he last used any of the aforementioned substances around 1999-2000. He reported using nitras as well, noting his last use was in 2010. Moreover, Shantanu reported a history of daily marijuana use and his last use was approximately one month ago. He shared his PCP is aware of his marijuana use and he "always" smokes in the "safety of [his] home," adding his daughter is never around. Regarding caffeine intake, Illya reported consuming soda daily. Furthermore, Avan indicated he is not experiencing the following: hopelessness, hallucinations and delusions, paranoia, symptoms of mania (e.g., expansive mood, flighty ideas, decreased need for sleep, engagement in risky behaviors) and crying spells. He also denied current suicidal ideation, plan, and intent; history of and current homicidal ideation, plan, and intent; and history of and current engagement in self-harm.  The following strengths were reported by Thurmond Butts: empathetic, smart, honest, and detail oriented. The following strengths were observed by this provider: ability to express thoughts and feelings during the therapeutic session, ability to establish and benefit from a therapeutic relationship, willingness to work toward established goal(s) with the clinic and ability to engage in reciprocal conversation.  Legal History: Sheila reported there is no history of legal involvement.   Structured Assessments Results: The Patient Health Questionnaire-9 (PHQ-9) is a self-report measure that assesses symptoms and severity of depression over the  course of the last two weeks. Kenna obtained a score of 8 suggesting mild depression. Bates finds the endorsed symptoms to be somewhat difficult. [0= Not at all; 1= Several days; 2= More than half the days; 3= Nearly every day] Little interest or pleasure in doing things 1  Feeling down, depressed, or hopeless 1  Trouble falling or staying asleep, or sleeping too much 1  Feeling tired or having little energy 2  Poor appetite or overeating 0  Feeling bad about yourself --- or that you are a failure or have let yourself or your family down 0  Trouble concentrating on things, such as reading the newspaper or watching television 3  Moving or speaking so slowly that other people could have noticed? Or the opposite --- being so fidgety or restless that you have been moving around a lot more than usual 0  Thoughts that you would be better off dead or hurting yourself in some way 0  PHQ-9 Score 8    The Generalized Anxiety Disorder-7 (GAD-7) is a brief self-report measure that assesses symptoms of anxiety over the course of the last two weeks. [facility] obtained a score of 5 suggesting mild anxiety. Fleetwood finds the endorsed symptoms to be somewhat difficult. [0= Not at all; 1= Several  days; 2= Over half the days; 3= Nearly every day] Feeling nervous, anxious, on edge 1  Not being able to stop or control worrying 0  Worrying too much about different things 0  Trouble relaxing 1  Being so restless that it's hard to sit still 0  Becoming easily annoyed or irritable 3  Feeling afraid as if something awful might happen 0  GAD-7 Score 5   Interventions:  Conducted a chart review Focused on rapport building Verbally administered PHQ-9 and GAD-7 for symptom monitoring Verbally administered Food & Mood questionnaire to assess various behaviors related to emotional eating. Provided emphatic reflections and validation Collaborated with patient on a treatment goal  Psychoeducation provided regarding physical  versus emotional hunger Conducted a risk assessment Developed a coping card Recommended/discussed option for longer-term therapeutic services  Provisional DSM-5 Diagnoses: 311 (F32.8) Other Specified Depressive Disorder, Emotional Eating Behaviors and 300.00 (F41.9) Unspecified Anxiety Disorder  Plan: Homer appears able and willing to participate as evidenced by collaboration on a treatment goal, engagement in reciprocal conversation, and asking questions as needed for clarification. Based on Derel's self-report of history and current symptomology, longer-term therapeutic services were recommended. This provider discussed options to establish care with a new provider, including Doc contacting his insurance company for a list of in-network providers, exploring [email or web address], or this provider placing a referral. Jeyren noted a plan to explore options between now and the next appointment with this provider.The next appointment will be scheduled in two weeks, which will be via [employer]. The following treatment goal was established: decrease emotional eating. This provider will regularly review the treatment plan and medical chart to keep informed of status changes and assess for safety. Jacksyn expressed understanding and agreement with the initial treatment plan of care. Jujuan will be sent a handout via e-mail to utilize between now and the next appointment to increase awareness of hunger patterns and subsequent eating. Damarrion provided verbal consent during today's appointment for this provider to send the handout via e-mail.

## 2020-01-29 ENCOUNTER — Other Ambulatory Visit: Payer: Self-pay | Admitting: General Practice

## 2020-01-30 ENCOUNTER — Other Ambulatory Visit: Payer: Self-pay

## 2020-01-30 ENCOUNTER — Ambulatory Visit (INDEPENDENT_AMBULATORY_CARE_PROVIDER_SITE_OTHER): Payer: 59 | Admitting: Psychology

## 2020-01-30 ENCOUNTER — Encounter (INDEPENDENT_AMBULATORY_CARE_PROVIDER_SITE_OTHER): Payer: Self-pay | Admitting: Family Medicine

## 2020-01-30 ENCOUNTER — Ambulatory Visit (INDEPENDENT_AMBULATORY_CARE_PROVIDER_SITE_OTHER): Payer: 59 | Admitting: Family Medicine

## 2020-01-30 VITALS — BP 138/79 | HR 54 | Temp 98.3°F | Ht 69.0 in | Wt 278.0 lb

## 2020-01-30 DIAGNOSIS — F3289 Other specified depressive episodes: Secondary | ICD-10-CM

## 2020-01-30 DIAGNOSIS — E559 Vitamin D deficiency, unspecified: Secondary | ICD-10-CM | POA: Diagnosis not present

## 2020-01-30 DIAGNOSIS — Z6841 Body Mass Index (BMI) 40.0 and over, adult: Secondary | ICD-10-CM

## 2020-01-30 DIAGNOSIS — E7849 Other hyperlipidemia: Secondary | ICD-10-CM

## 2020-01-30 DIAGNOSIS — Z9189 Other specified personal risk factors, not elsewhere classified: Secondary | ICD-10-CM

## 2020-01-30 DIAGNOSIS — F419 Anxiety disorder, unspecified: Secondary | ICD-10-CM

## 2020-01-30 DIAGNOSIS — I10 Essential (primary) hypertension: Secondary | ICD-10-CM | POA: Diagnosis not present

## 2020-01-30 DIAGNOSIS — E8881 Metabolic syndrome: Secondary | ICD-10-CM | POA: Diagnosis not present

## 2020-01-30 NOTE — Progress Notes (Signed)
Chief Complaint:   OBESITY Hector [name] is here to discuss his progress with his obesity treatment plan along with follow-up of his obesity related diagnoses. Hector [name] is on the Category 2 Plan and states he is following his eating plan approximately 40-50% of the time. Hector [name] states he is exercising for 0 minutes 0 times per week.  Today's visit was #: 5 Starting weight: 282 lbs Starting date: 11/16/2019 Today's weight: 278 lbs Today's date: 01/30/2020 Total lbs lost to date: 4 lbs Total lbs lost since last in-office visit: 0  Interim History: Hector [name] says he is back to drinking sodas.  He has been going out to eat on the weekends when he is with his daughter.  Subjective:   1. Essential hypertension Review: taking medications as instructed, no medication side effects noted, no chest pain on exertion, no dyspnea on exertion, no swelling of ankles.  He is currently taking losartan and Bystolic for blood pressure control.   BP Readings from Last 3 Encounters:  01/30/20 138/79  01/09/20 121/70  12/14/19 126/80   2. Insulin resistance Hector [name] has a diagnosis of insulin resistance based on his elevated fasting insulin level >5. He continues to work on diet and exercise to decrease his risk of diabetes.  Lab Results  Component Value Date   INSULIN 35.9 (H) 11/16/2019   Lab Results  Component Value Date   HGBA1C 5.2 11/16/2019   3. Vitamin D deficiency Hector [name]'s Vitamin D level was 17.8 on 11/16/2019. He is currently taking vit D. He denies nausea, vomiting or muscle weakness.  4. Other hyperlipidemia Hector [name] has hyperlipidemia and has been trying to improve his cholesterol levels with intensive lifestyle modification including a low saturated fat diet, exercise and weight loss. He denies any chest pain, claudication or myalgias.  He is taking Lipitor 50 mg daily.  Lab Results  Component Value Date   ALT 21 11/16/2019   AST 18 11/16/2019   ALKPHOS 89 11/16/2019   BILITOT 0.3 11/16/2019   Lab  Results  Component Value Date   CHOL 136 11/16/2019   HDL 35 (L) 11/16/2019   LDLCALC 81 11/16/2019   TRIG 105 11/16/2019   CHOLHDL 3 06/12/2019   5. Other depression, with emotional eating Hector [name] is struggling with emotional eating and using food for comfort to the extent that it is negatively impacting his health. He has been working on behavior modification techniques to help reduce his emotional eating and has been unsuccessful. He shows no sign of suicidal or homicidal ideations.  He is taking Prozac 10 mg daily and is also seeing Dr. Mallie Mussel.  6. At risk for heart disease Hector [name] is at a higher than average risk for cardiovascular disease due to obesity. Reviewed: no chest pain on exertion, no dyspnea on exertion, and no swelling of ankles.  Assessment/Plan:   1. Essential hypertension [name] is working on healthy weight loss and exercise to improve blood pressure control. We will watch for signs of hypotension as he continues his lifestyle modifications.  2. Insulin resistance Hector [name] will continue to work on weight loss, exercise, and decreasing simple carbohydrates to help decrease the risk of diabetes. Hector [name] agreed to follow-up with [place] as directed to closely monitor his progress.  He will consider metformin 500 mg daily and will call if he would like to start.  3. Vitamin D deficiency Low Vitamin D level contributes to fatigue and are associated with obesity, breast, and colon cancer. He agrees to continue to take prescription Vitamin  D @50 ,000 IU every week and will follow-up for routine testing of Vitamin D, at least 2-3 times per year to avoid over-replacement.  4. Other hyperlipidemia Cardiovascular risk and specific lipid/LDL goals reviewed.  We discussed several lifestyle modifications today and Hector [name] will continue to work on diet, exercise and weight loss efforts. Orders and follow up as documented in patient record.   Counseling Intensive lifestyle modifications are the first line  treatment for this issue. . Dietary changes: Increase soluble fiber. Decrease simple carbohydrates. . Exercise changes: Moderate to vigorous-intensity aerobic activity 150 minutes per week if tolerated. . Lipid-lowering medications: see documented in medical record.  5. Other depression, with emotional eating Behavior modification techniques were discussed today to help Hector [name] deal with his emotional/non-hunger eating behaviors.  Orders and follow up as documented in patient record.   6. At risk for heart disease Hector [name] was given approximately 15 minutes of coronary artery disease prevention counseling today. He is 49 y.o. male and has risk factors for heart disease including obesity. We discussed intensive lifestyle modifications today with an emphasis on specific weight loss instructions and strategies.   Repetitive spaced learning was employed today to elicit superior memory formation and behavioral change.  7. Class 3 severe obesity with serious comorbidity and body mass index (BMI) of 40.0 to 44.9 in adult, unspecified obesity type (HCC) Hector [name] is currently in the action stage of change. As such, his goal is to continue with weight loss efforts. He has agreed to the Category 2 Plan.   Exercise goals: Walking  Behavioral modification strategies: increasing lean protein intake, decreasing simple carbohydrates, increasing vegetables, increasing water intake and decreasing liquid calories.  Stop drinking sodas.  Hector [name] has agreed to follow-up with our clinic in 2 weeks. He was informed of the importance of frequent follow-up visits to maximize his success with intensive lifestyle modifications for his multiple health conditions.   Objective:   Blood pressure 138/79, pulse (!) 54, temperature 98.3 F (36.8 C), temperature source Oral, height 5' 9"  (1.753 m), weight 278 lb (126.1 kg), SpO2 96 %. Body mass index is 41.05 kg/m.  General: Cooperative, alert, well developed, in no acute  distress. HEENT: Conjunctivae and lids unremarkable. Cardiovascular: Regular rhythm.  Lungs: Normal work of breathing. Neurologic: No focal deficits.   Lab Results  Component Value Date   CREATININE 0.98 11/16/2019   BUN 18 11/16/2019   NA 143 11/16/2019   K 4.5 11/16/2019   CL 105 11/16/2019   CO2 25 11/16/2019   Lab Results  Component Value Date   ALT 21 11/16/2019   AST 18 11/16/2019   ALKPHOS 89 11/16/2019   BILITOT 0.3 11/16/2019   Lab Results  Component Value Date   HGBA1C 5.2 11/16/2019   Lab Results  Component Value Date   INSULIN 35.9 (H) 11/16/2019   Lab Results  Component Value Date   TSH 2.410 11/16/2019   Lab Results  Component Value Date   CHOL 136 11/16/2019   HDL 35 (L) 11/16/2019   LDLCALC 81 11/16/2019   TRIG 105 11/16/2019   CHOLHDL 3 06/12/2019   Lab Results  Component Value Date   WBC 6.5 11/16/2019   HGB 15.5 11/16/2019   HCT 44.8 11/16/2019   MCV 88 11/16/2019   PLT 150 11/16/2019   Attestation Statements:   Reviewed by clinician on day of visit: allergies, medications, problem list, medical history, surgical history, family history, social history, and previous encounter notes.  I, Water quality scientist, CMA, am acting  as transcriptionist for Briscoe Deutscher, DO.  I have reviewed the above documentation for accuracy and completeness, and I agree with the above. Briscoe Deutscher, DO

## 2020-01-31 NOTE — Telephone Encounter (Signed)
Please advise 

## 2020-02-01 MED ORDER — METFORMIN HCL 500 MG PO TABS: 500.0000 mg | ORAL_TABLET | Freq: Every day | ORAL | 0 refills | Status: DC

## 2020-02-01 NOTE — Progress Notes (Signed)
  Office: [phone]  /  Fax: [phone]    Date: February 15, 2020   Appointment Start Time: 10:35am Duration: 39 minutes Provider: Glennie Isle, Psy.D. Type of Session: Individual Therapy  Location of Patient: Home Location of Provider: Provider's Home Type of Contact: Telepsychological Visit via [place] Telephone call  Session Content: This provider called Hector [name] at 10:32am as he did not present for the WebEx appointment. He indicated he forgot about today's appointment, but stated he could sign on. The e-mail with the secure link was re-sent. As such, today's appointment was initiated 5 minutes late.  Hector [name] is a 49 y.o. male presenting via [place] for a follow-up appointment to address the previously established treatment goal of decreasing emotional eating. Today's appointment was a telepsychological visit due to COVID-19. Hector [name] provided verbal consent for today's telepsychological appointment and he is aware he is responsible for securing confidentiality on his end of the session. Prior to proceeding with today's appointment, Hector [name] physical location at the time of this appointment was obtained as well a phone number he could be reached at in the event of technical difficulties. Hector [name] and this provider participated in today's telepsychological service. Of note, today's appointment was switched to a regular telephone call 10:37am due to inclement weather impacting power on the provider's end.   This provider conducted a brief check-in. Hector [name] shared about recent events, including him switching to diet soda and water instead of regular soda. He also noted a plan to start incorporating physical activity. Emotional and physical hunger was discussed further and Hector [name] provided examples for emotional hunger. The importance of eating regularly was discussed, including consequences (e.g., increased irritability, feeling unfocused) of not eating regularly. This provider recommended setting alarms  for reminders to eat, especially when working. He agreed. Hector [name] acknowledged he did not have the opportunity to explore therapists, noting a plan to explore options between now and the next appointment. Hector [name] was receptive to today's appointment as evidenced by openness to sharing, responsiveness to feedback, and willingness to discuss hunger patterns.  Mental Status Examination:  Appearance: well groomed and appropriate hygiene  Behavior: appropriate to circumstances Mood: euthymic Affect: mood congruent Speech: normal in rate, volume, and tone Eye Contact: appropriate Psychomotor Activity: appropriate Gait: unable to assess Thought Process: linear, logical, and goal directed  Thought Content/Perception: denies suicidal and homicidal ideation, plan, and intent and no hallucinations, delusions, bizarre thinking or behavior reported or observed Orientation: time, person, place and purpose of appointment Memory/Concentration: memory, attention, language, and fund of knowledge intact  Insight/Judgment: good  Interventions:  Conducted a brief chart review Provided empathic reflections and validation Employed supportive psychotherapy interventions to facilitate reduced distress and to improve coping skills with identified stressors Employed motivational interviewing skills to assess patient's willingness/desire to adhere to recommended medical treatments and assignments Psychoeducation provided regarding physical versus emotional hunger  DSM-5 Diagnoses: 311 (F32.8) Other Specified Depressive Disorder, Emotional Eating Behaviors and 300.00 (F41.9) Unspecified Anxiety Disorder  Treatment Goal & Progress: During the initial appointment with this provider, the following treatment goal was established: decrease emotional eating. Progress is limited, as Hector [name] has just begun treatment with this provider; however, he is receptive to the interaction and interventions and rapport is being established.    Plan: The next appointment will be scheduled in two weeks, which will be via [employer]. The next session will focus on working towards the established treatment goal.

## 2020-02-13 ENCOUNTER — Encounter (INDEPENDENT_AMBULATORY_CARE_PROVIDER_SITE_OTHER): Payer: Self-pay | Admitting: Family Medicine

## 2020-02-13 ENCOUNTER — Other Ambulatory Visit: Payer: Self-pay

## 2020-02-13 ENCOUNTER — Ambulatory Visit (INDEPENDENT_AMBULATORY_CARE_PROVIDER_SITE_OTHER): Payer: 59 | Admitting: Family Medicine

## 2020-02-13 VITALS — BP 111/72 | HR 54 | Temp 97.8°F | Ht 69.0 in | Wt 275.0 lb

## 2020-02-13 DIAGNOSIS — F3289 Other specified depressive episodes: Secondary | ICD-10-CM

## 2020-02-13 DIAGNOSIS — E8881 Metabolic syndrome: Secondary | ICD-10-CM | POA: Diagnosis not present

## 2020-02-13 DIAGNOSIS — E559 Vitamin D deficiency, unspecified: Secondary | ICD-10-CM

## 2020-02-13 DIAGNOSIS — Z9189 Other specified personal risk factors, not elsewhere classified: Secondary | ICD-10-CM

## 2020-02-13 DIAGNOSIS — Z6841 Body Mass Index (BMI) 40.0 and over, adult: Secondary | ICD-10-CM

## 2020-02-13 NOTE — Progress Notes (Signed)
Chief Complaint:   OBESITY Hector [name] is here to discuss his progress with his obesity treatment plan along with follow-up of his obesity related diagnoses. Hector [name] is on the Category 2 Plan and states he is following his eating plan approximately 45-50% of the time. Hector [name] states he is exercising for 0 minutes 0 times per week.  Today's visit was #: 6 Starting weight: 282 lbs Starting date: 01/30/2020 Today's weight: 275 lbs Today's date: 02/13/2020 Total lbs lost to date: 7 lbs Total lbs lost since last in-office visit: 3 lbs  Interim History: Hector [name] has been eating [place].  No more sodas!  Subjective:   1. Insulin resistance Hector [name] has a diagnosis of insulin resistance based on his elevated fasting insulin level >5. He continues to work on diet and exercise to decrease his risk of diabetes.  Lab Results  Component Value Date   INSULIN 35.9 (H) 11/16/2019   Lab Results  Component Value Date   HGBA1C 5.2 11/16/2019   2. Vitamin D deficiency Hector [name]'s Vitamin D level was 17.8 on 11/16/2019. He is currently taking vit D. He denies nausea, vomiting or muscle weakness.  3. Other depression, with emotional eating Hector [name] is struggling with emotional eating and using food for comfort to the extent that it is negatively impacting his health. He has been working on behavior modification techniques to help reduce his emotional eating and has been unsuccessful. He shows no sign of suicidal or homicidal ideations.  He is taking Prozac and is seeing Dr. Mallie Mussel.  4. At risk for heart disease Hector [name] is at a higher than average risk for cardiovascular disease due to obesity. Reviewed: no chest pain on exertion, no dyspnea on exertion, and no swelling of ankles.  Assessment/Plan:   1. Insulin resistance Hector [name] will continue to work on weight loss, exercise, and decreasing simple carbohydrates to help decrease the risk of diabetes. Hector [name] agreed to follow-up with [place] as directed to closely monitor his  progress.  2. Vitamin D deficiency Low Vitamin D level contributes to fatigue and are associated with obesity, breast, and colon cancer. He agrees to continue to take prescription Vitamin D @50 ,000 IU every week and will follow-up for routine testing of Vitamin D, at least 2-3 times per year to avoid over-replacement.  3. Other depression, with emotional eating Behavior modification techniques were discussed today to help Hector [name] deal with his emotional/non-hunger eating behaviors.  Orders and follow up as documented in patient record.   4. At risk for heart disease Hector [name] was given approximately 15 minutes of coronary artery disease prevention counseling today. He is 49 y.o. male and has risk factors for heart disease including obesity. We discussed intensive lifestyle modifications today with an emphasis on specific weight loss instructions and strategies.   Repetitive spaced learning was employed today to elicit superior memory formation and behavioral change.  5. Class 3 severe obesity with serious comorbidity and body mass index (BMI) of 40.0 to 44.9 in adult, unspecified obesity type (HCC) Hector [name] is currently in the action stage of change. As such, his goal is to continue with weight loss efforts. He has agreed to the Category 2 Plan.   Exercise goals: All adults should avoid inactivity. Some physical activity is better than none, and adults who participate in any amount of physical activity gain some health benefits.  Behavioral modification strategies: increasing lean protein intake and increasing water intake.  Hector [name] has agreed to follow-up with our clinic in 2 weeks. He was  informed of the importance of frequent follow-up visits to maximize his success with intensive lifestyle modifications for his multiple health conditions.   Objective:   Blood pressure 111/72, pulse (!) 54, temperature 97.8 F (36.6 C), temperature source Oral, height 5' 9"  (1.753 m), weight 275 lb (124.7 kg), SpO2 97  %. Body mass index is 40.61 kg/m.  General: Cooperative, alert, well developed, in no acute distress. HEENT: Conjunctivae and lids unremarkable. Cardiovascular: Regular rhythm.  Lungs: Normal work of breathing. Neurologic: No focal deficits.   Lab Results  Component Value Date   CREATININE 0.98 11/16/2019   BUN 18 11/16/2019   NA 143 11/16/2019   K 4.5 11/16/2019   CL 105 11/16/2019   CO2 25 11/16/2019   Lab Results  Component Value Date   ALT 21 11/16/2019   AST 18 11/16/2019   ALKPHOS 89 11/16/2019   BILITOT 0.3 11/16/2019   Lab Results  Component Value Date   HGBA1C 5.2 11/16/2019   Lab Results  Component Value Date   INSULIN 35.9 (H) 11/16/2019   Lab Results  Component Value Date   TSH 2.410 11/16/2019   Lab Results  Component Value Date   CHOL 136 11/16/2019   HDL 35 (L) 11/16/2019   LDLCALC 81 11/16/2019   TRIG 105 11/16/2019   CHOLHDL 3 06/12/2019   Lab Results  Component Value Date   WBC 6.5 11/16/2019   HGB 15.5 11/16/2019   HCT 44.8 11/16/2019   MCV 88 11/16/2019   PLT 150 11/16/2019   Attestation Statements:   Reviewed by clinician on day of visit: allergies, medications, problem list, medical history, surgical history, family history, social history, and previous encounter notes.  I, Water quality scientist, CMA, am acting as Location manager for [employer], DO.  I have reviewed the above documentation for accuracy and completeness, and I agree with the above. Briscoe Deutscher, DO

## 2020-02-14 ENCOUNTER — Other Ambulatory Visit: Payer: Self-pay | Admitting: Family Medicine

## 2020-02-15 ENCOUNTER — Ambulatory Visit (INDEPENDENT_AMBULATORY_CARE_PROVIDER_SITE_OTHER): Payer: 59 | Admitting: Psychology

## 2020-02-15 ENCOUNTER — Other Ambulatory Visit: Payer: Self-pay

## 2020-02-15 DIAGNOSIS — F3289 Other specified depressive episodes: Secondary | ICD-10-CM

## 2020-02-15 DIAGNOSIS — F419 Anxiety disorder, unspecified: Secondary | ICD-10-CM | POA: Diagnosis not present

## 2020-02-27 ENCOUNTER — Encounter (INDEPENDENT_AMBULATORY_CARE_PROVIDER_SITE_OTHER): Payer: Self-pay | Admitting: Physician Assistant

## 2020-02-27 ENCOUNTER — Ambulatory Visit (INDEPENDENT_AMBULATORY_CARE_PROVIDER_SITE_OTHER): Payer: 59 | Admitting: Physician Assistant

## 2020-02-27 ENCOUNTER — Other Ambulatory Visit: Payer: Self-pay

## 2020-02-27 VITALS — BP 145/74 | HR 52 | Temp 97.8°F | Ht 69.0 in | Wt 280.0 lb

## 2020-02-27 DIAGNOSIS — Z6841 Body Mass Index (BMI) 40.0 and over, adult: Secondary | ICD-10-CM

## 2020-02-27 DIAGNOSIS — E7849 Other hyperlipidemia: Secondary | ICD-10-CM | POA: Diagnosis not present

## 2020-02-28 NOTE — Progress Notes (Signed)
Chief Complaint:   OBESITY Hector [name] is here to discuss his progress with his obesity treatment plan along with follow-up of his obesity related diagnoses. Hector [name] is on the Category 2 Plan and states he is following his eating plan approximately 40% of the time. Hector [name] states he is exercising for 0 minutes 0 times per week.  Today's visit was #: 7 Starting weight: 282 lbs Starting date: 01/30/2020 Today's weight: 280 lbs Today's date: 02/27/2020 Total lbs lost to date: 2 lbs Total lbs lost since last in-office visit: 0  Interim History: Hector [name] states that he has not been following the plan well as he has lost motivation.  He has his daughter 3 days a week and they eat out often.  He wants to start exercising.  Subjective:   1. Other hyperlipidemia Hector [name] has hyperlipidemia and has been trying to improve his cholesterol levels with intensive lifestyle modification including a low saturated fat diet, exercise and weight loss. He denies any chest pain, claudication or myalgias.  He takes atorvastatin.  Lab Results  Component Value Date   ALT 21 11/16/2019   AST 18 11/16/2019   ALKPHOS 89 11/16/2019   BILITOT 0.3 11/16/2019   Lab Results  Component Value Date   CHOL 136 11/16/2019   HDL 35 (L) 11/16/2019   LDLCALC 81 11/16/2019   TRIG 105 11/16/2019   CHOLHDL 3 06/12/2019   Assessment/Plan:   1. Other hyperlipidemia Cardiovascular risk and specific lipid/LDL goals reviewed.  We discussed several lifestyle modifications today and Hector [name] will continue to work on diet, exercise and weight loss efforts. Orders and follow up as documented in patient record.   Counseling Intensive lifestyle modifications are the first line treatment for this issue. . Dietary changes: Increase soluble fiber. Decrease simple carbohydrates. . Exercise changes: Moderate to vigorous-intensity aerobic activity 150 minutes per week if tolerated. . Lipid-lowering medications: see documented in medical record.  2.  Class 3 severe obesity with serious comorbidity and body mass index (BMI) of 40.0 to 44.9 in adult, unspecified obesity type (HCC) Hector [name] is currently in the action stage of change. As such, his goal is to continue with weight loss efforts. He has agreed to the Category 2 Plan.   Exercise goals: Walk 1-2 days a week for 10-5 minutes to start.  Behavioral modification strategies: increasing lean protein intake and meal planning and cooking strategies.  Hector [name] has agreed to follow-up with our clinic in 2 weeks. He was informed of the importance of frequent follow-up visits to maximize his success with intensive lifestyle modifications for his multiple health conditions.   Objective:   Blood pressure (!) 145/74, pulse (!) 52, temperature 97.8 F (36.6 C), height 5' 9"  (1.753 m), weight 280 lb (127 kg), SpO2 97 %. Body mass index is 41.35 kg/m.  General: Cooperative, alert, well developed, in no acute distress. HEENT: Conjunctivae and lids unremarkable. Cardiovascular: Regular rhythm.  Lungs: Normal work of breathing. Neurologic: No focal deficits.   Lab Results  Component Value Date   CREATININE 0.98 11/16/2019   BUN 18 11/16/2019   NA 143 11/16/2019   K 4.5 11/16/2019   CL 105 11/16/2019   CO2 25 11/16/2019   Lab Results  Component Value Date   ALT 21 11/16/2019   AST 18 11/16/2019   ALKPHOS 89 11/16/2019   BILITOT 0.3 11/16/2019   Lab Results  Component Value Date   HGBA1C 5.2 11/16/2019   Lab Results  Component Value Date   INSULIN 35.9 (  H) 11/16/2019   Lab Results  Component Value Date   TSH 2.410 11/16/2019   Lab Results  Component Value Date   CHOL 136 11/16/2019   HDL 35 (L) 11/16/2019   LDLCALC 81 11/16/2019   TRIG 105 11/16/2019   CHOLHDL 3 06/12/2019   Lab Results  Component Value Date   WBC 6.5 11/16/2019   HGB 15.5 11/16/2019   HCT 44.8 11/16/2019   MCV 88 11/16/2019   PLT 150 11/16/2019   Attestation Statements:   Reviewed by clinician on day  of visit: allergies, medications, problem list, medical history, surgical history, family history, social history, and previous encounter notes.  Time spent on visit including pre-visit chart review and post-visit care and charting was 35 minutes.   I, Water quality scientist, CMA, am acting as Location manager for [employer], PA-C.  I have reviewed the above documentation for accuracy and completeness, and I agree with the above. [name] Potash, PA-C

## 2020-02-29 ENCOUNTER — Ambulatory Visit (INDEPENDENT_AMBULATORY_CARE_PROVIDER_SITE_OTHER): Payer: Self-pay | Admitting: Psychology

## 2020-03-01 ENCOUNTER — Other Ambulatory Visit: Payer: Self-pay | Admitting: Family Medicine

## 2020-03-05 ENCOUNTER — Encounter: Payer: Self-pay | Admitting: Family Medicine

## 2020-03-05 ENCOUNTER — Ambulatory Visit (INDEPENDENT_AMBULATORY_CARE_PROVIDER_SITE_OTHER): Payer: 59 | Admitting: Family Medicine

## 2020-03-05 ENCOUNTER — Other Ambulatory Visit: Payer: Self-pay

## 2020-03-05 VITALS — BP 138/80 | HR 51 | Temp 97.9°F | Ht 69.0 in | Wt 285.2 lb

## 2020-03-05 DIAGNOSIS — E7800 Pure hypercholesterolemia, unspecified: Secondary | ICD-10-CM | POA: Diagnosis not present

## 2020-03-05 DIAGNOSIS — F410 Panic disorder [episodic paroxysmal anxiety] without agoraphobia: Secondary | ICD-10-CM | POA: Diagnosis not present

## 2020-03-05 DIAGNOSIS — I10 Essential (primary) hypertension: Secondary | ICD-10-CM | POA: Diagnosis not present

## 2020-03-05 MED ORDER — LOSARTAN POTASSIUM 50 MG PO TABS: 50.0000 mg | ORAL_TABLET | Freq: Every day | ORAL | 3 refills | Status: DC

## 2020-03-05 MED ORDER — LORAZEPAM 1 MG PO TABS: 1.0000 mg | ORAL_TABLET | Freq: Three times a day (TID) | ORAL | 0 refills | Status: DC | PRN

## 2020-03-05 NOTE — Progress Notes (Signed)
Subjective:     Patient ID: Hector [name], male   DOB: May 23, 1971, 49 y.o.   MRN: [id number]  HPI Hector [name] is seen for the following issues  He has morbid obesity.  He has been seen at the [facility] weight loss center and initially had some success losing about 8 pounds but he states he gained the weight back over the holidays.  He has made some dietary changes.  For example, he is given up regular sodas and drinking diet sodas and is reduced consumption of things like juices.  He feels he is doing a lot of stress eating.  Not exercising.  He has hypertension which is treated with by systolic and losartan.  We added losartan a few months ago.  Not monitoring blood pressures regularly but these have been relatively stable when he is gone to the weight loss clinic.  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  Calcium score 70.  Patient was placed on Lipitor but has not been taking this regularly.  He denies any side effects.  We emphasized the importance of taking this regularly  Patient requesting refill of lorazepam.  He has history of probable panic disorder.  He is taking this situationally for things like flying and anticipates doing that soon.  He rarely takes this and only situational  Past Medical History:  Diagnosis Date  . Alcohol abuse   . Allergy   . Anal fissure   . Anxiety   . Asthma   . Back pain   . Bilateral swelling of feet   . Blood in stool   . Chest pain   . Depression   . Drug use   . Febrile seizure ([place])   . Heartburn   . Hyperlipidemia   . Hypertension   . Irritable bowel syndrome with diarrhea   . Jaundice, newborn   . Palpitations   . Panic   . Panic disorder   . Pre-diabetes   . Seizures (HCC)    febrile  . Shortness of breath   . Sleep apnea    Past Surgical History:  Procedure Laterality Date  . [place]    reports that he has never  smoked. He has never used smokeless tobacco. He reports current alcohol use. He reports current drug use. Drug: Marijuana. family history includes Basal cell carcinoma in his mother; Cancer in his mother; Colitis in his brother; Colon polyps in his brother and father; Dementia in his mother; Depression in his father and mother; Diabetes in his father; Heart attack in his father; Heart disease (age of onset: 61) in his father; Hyperlipidemia in his father; Hypertension in his father and mother; Obesity in his father; Skin cancer in his mother; Sleep apnea in his father; Squamous cell carcinoma in his mother; Stroke in his father and mother. No Known Allergies   Review of Systems  Constitutional: Negative for appetite change, fatigue and unexpected weight change.  Eyes: Negative for visual disturbance.  Respiratory: Negative for cough, chest tightness and shortness of breath.   Cardiovascular: Negative for chest pain, palpitations and leg swelling.  Gastrointestinal: Negative for abdominal pain.  Endocrine: Negative for polydipsia and polyuria.  Neurological: Negative for dizziness, syncope, weakness, light-headedness and headaches.  Psychiatric/Behavioral: Negative for dysphoric mood.       Objective:   Physical Exam Vitals reviewed.  Constitutional:      Appearance:  He is well-developed.  HENT:     Right Ear: External ear normal.     Left Ear: External ear normal.  Eyes:     Pupils: Pupils are equal, round, and reactive to light.  Neck:     Thyroid: No thyromegaly.  Cardiovascular:     Rate and Rhythm: Normal rate and regular rhythm.  Pulmonary:     Effort: Pulmonary effort is normal. No respiratory distress.     Breath sounds: Normal breath sounds. No wheezing or rales.  Musculoskeletal:     Cervical back: Neck supple.     Right lower leg: No edema.     Left lower leg: No edema.  Neurological:     Mental Status: He is alert and oriented to person, place, and time.         Assessment:     #1 hypertension.  Marginally controlled.  This should improve further with additional weight loss.  He has seen some improvement with addition of losartan  #2 hyperlipidemia in a patient with nonobstructive CAD LAD lesion.  Stressed importance of getting back on Lipitor regularly he will start back with this  #3 situational anxiety and history of panic disorder    Plan:     -Refilled limited lorazepam to use situationally as above.  Continue fluoxetine -Strongly advised to get back on daily use of atorvastatin -He is encouraged to continue follow-up with weight loss clinic and we discussed some strategies today.  He needs to get engaged in more consistent exercise.  Hector Post MD [facility]

## 2020-03-05 NOTE — Progress Notes (Signed)
Office: [phone]  /  Fax: [phone]    Date: March 19, 2020   Appointment Start Time: 2:31pm Duration: 32 minutes Provider: Glennie Isle, Psy.D. Type of Session: Individual Therapy  Location of Patient: Home Location of Provider: Provider's Home Type of Contact: Telepsychological Visit via [employer]  Session Content: Hector [name] is a 49 y.o. male presenting via [place] for a follow-up appointment to address the previously established treatment goal of decreasing emotional eating. Today's appointment was a telepsychological visit due to COVID-19. Corbett provided verbal consent for today's telepsychological appointment and he is aware he is responsible for securing confidentiality on his end of the session. Prior to proceeding with today's appointment, Jamarco's physical location at the time of this appointment was obtained as well a phone number he could be reached at in the event of technical difficulties. Nethan and this provider participated in today's telepsychological service.   This provider conducted a brief check-in. Charle reported being "off the rails" with the meal plan. He noted feeling "disgusted" and "disappointed" with himself. Kayler further shared having the thought of not continuing with the clinic. This was further explored and processed. He reported a belief that stress and thinking about foods he enjoys has contributed to the aforementioned. Additionally, Tyaire described feeling "hopeless" as it relates to his eating habits. He also discussed feeling "guilty" regarding an incident with his daughter, which he described as an "adult tantrum." He added there was "no abuse." Qusay explained he raised his voice following her doing what he repeatedly asked her not to do. He denied engaging in name calling and making hurtful comments at her, noting, "I'm very protective of my daughter's confidence." He acknowledged throwing things as he was upset, but clarified it was not towards or at his  daughter. He stated he apologized to her and expressed desire to address parenting strategies to avoid instances like the aforementioned in therapeutic services, adding he plans to also discuss the aforementioned incident with his daughter's therapist. This provider again re-visited the recommendation of longer-term therapeutic services. This provider discussed the option again of placing a referral. However, Kathy noted a desire to reach out to his daughter's provider's practice to schedule an initial appointment or explore [email or web address]. Furthermore, psychoeducation regarding all or nothing was provided as it relates to eating. This provider and Loghan discussed focusing on eating congruent to his meal plan for at least one meal a day. He identified dinner and was receptive to eating dinner congruent to his meal plan between now and the next appointment with this provider. Additionally, he was encouraged to focus on protein intake for the other meals. He agreed. Overall, Cindy was receptive to today's appointment as evidenced by openness to sharing, responsiveness to feedback, and willingness to implement discussed strategies .  Mental Status Examination:  Appearance: well groomed and appropriate hygiene  Behavior: appropriate to circumstances Mood: frustrated Affect: mood congruent Speech: normal in rate, volume, and tone Eye Contact: appropriate Psychomotor Activity: appropriate Gait: unable to assess Thought Process: linear, logical, and goal directed  Thought Content/Perception: denies suicidal and homicidal ideation, plan, and intent and no hallucinations, delusions, bizarre thinking or behavior reported or observed Orientation: time, person, place and purpose of appointment Memory/Concentration: memory, attention, language, and fund of knowledge intact  Insight/Judgment: fair  Interventions:  Conducted a brief chart review Provided empathic reflections and validation Employed supportive  psychotherapy interventions to facilitate reduced distress and to improve coping skills with identified stressors Employed motivational interviewing skills to assess patient's  willingness/desire to adhere to recommended medical treatments and assignments Engaged patient in goal setting  Psychoeducation provided regarding all or nothing thinking  DSM-5 Diagnosis(es): 311 (F32.8) Other Specified Depressive Disorder, Emotional Eating Behaviors and 300.00 (F41.9) Unspecified Anxiety Disorder  Treatment Goal & Progress: During the initial appointment with this provider, the following treatment goal was established: decrease emotional eating. Abu has demonstrated progress in his goal as evidenced by increased awareness of hunger patterns and increased awareness of triggers for emotional eating.   Plan: The next appointment will be scheduled in two weeks, which will be via [employer]. The next session will focus on working towards the established treatment goal.

## 2020-03-13 ENCOUNTER — Ambulatory Visit (INDEPENDENT_AMBULATORY_CARE_PROVIDER_SITE_OTHER): Payer: 59 | Admitting: Family Medicine

## 2020-03-18 ENCOUNTER — Other Ambulatory Visit (INDEPENDENT_AMBULATORY_CARE_PROVIDER_SITE_OTHER): Payer: Self-pay | Admitting: Family Medicine

## 2020-03-18 DIAGNOSIS — E559 Vitamin D deficiency, unspecified: Secondary | ICD-10-CM

## 2020-03-19 ENCOUNTER — Other Ambulatory Visit: Payer: Self-pay

## 2020-03-19 ENCOUNTER — Ambulatory Visit (INDEPENDENT_AMBULATORY_CARE_PROVIDER_SITE_OTHER): Payer: 59 | Admitting: Psychology

## 2020-03-19 DIAGNOSIS — F3289 Other specified depressive episodes: Secondary | ICD-10-CM

## 2020-03-19 DIAGNOSIS — F419 Anxiety disorder, unspecified: Secondary | ICD-10-CM | POA: Diagnosis not present

## 2020-03-19 NOTE — Progress Notes (Unsigned)
Office: [phone]  /  Fax: [phone]    Date: April 02, 2020   Appointment Start Time: *** Duration: *** minutes Provider: Glennie Isle, Psy.D. Type of Session: Individual Therapy  Location of Patient: {gbptloc:23249} Location of Provider: {Location of Service:22491} Type of Contact: Telepsychological Visit via {gbtelepsych:23399}  Session Content: Hector [name] is a 49 y.o. male presenting via {gbtelepsych:23399} for a follow-up appointment to address the previously established treatment goal of decreasing emotional eating. Today's appointment was a telepsychological visit due to COVID-19. Levander provided verbal consent for today's telepsychological appointment and he is aware he is responsible for securing confidentiality on his end of the session. Prior to proceeding with today's appointment, [name]'s physical location at the time of this appointment was obtained as well a phone number he could be reached at in the event of technical difficulties. [name] and this provider participated in today's telepsychological service.   This provider conducted a brief check-in and verbally administered the PHQ-9 and GAD-7. *** [name] was receptive to today's appointment as evidenced by openness to sharing, responsiveness to feedback, and {gbreceptiveness:23401}.  Mental Status Examination:  Appearance: {Appearance:22431} Behavior: {Behavior:22445} Mood: {gbmood:21757} Affect: {Affect:22436} Speech: {Speech:22432} Eye Contact: {Eye Contact:22433} Psychomotor Activity: {Motor Activity:22434} Gait: {gbgait:23404} Thought Process: {thought process:22448}  Thought Content/Perception: {disturbances:22451} Orientation: {Orientation:22437} Memory/Concentration: {gbcognition:22449} Insight/Judgment: {Insight:22446}  Structured Assessments Results: The Patient Health Questionnaire-9 (PHQ-9) is a self-report measure that assesses symptoms and severity of depression over the course of the last two weeks. [name]  obtained a score of *** suggesting {GBPHQ9SEVERITY:21752}. [name] finds the endorsed symptoms to be {gbphq9difficulty:21754}. [0= Not at all; 1= Several days; 2= More than half the days; 3= Nearly every day] Little interest or pleasure in doing things ***  Feeling down, depressed, or hopeless ***  Trouble falling or staying asleep, or sleeping too much ***  Feeling tired or having little energy ***  Poor appetite or overeating ***  Feeling bad about yourself --- or that you are a failure or have let yourself or your family down ***  Trouble concentrating on things, such as reading the newspaper or watching television ***  Moving or speaking so slowly that other people could have noticed? Or the opposite --- being so fidgety or restless that you have been moving around a lot more than usual ***  Thoughts that you would be better off dead or hurting yourself in some way ***  PHQ-9 Score ***    The Generalized Anxiety Disorder-7 (GAD-7) is a brief self-report measure that assesses symptoms of anxiety over the course of the last two weeks. [name] obtained a score of *** suggesting {gbgad7severity:21753}. [name] finds the endorsed symptoms to be {gbphq9difficulty:21754}. [0= Not at all; 1= Several days; 2= Over half the days; 3= Nearly every day] Feeling nervous, anxious, on edge ***  Not being able to stop or control worrying ***  Worrying too much about different things ***  Trouble relaxing ***  Being so restless that it's hard to sit still ***  Becoming easily annoyed or irritable ***  Feeling afraid as if something awful might happen ***  GAD-7 Score ***   Interventions:  {Interventions for Progress Notes:23405}  DSM-5 Diagnosis(es): 311 (F32.8) Other Specified Depressive Disorder, Emotional Eating Behaviors  Treatment Goal & Progress: During the initial appointment with this provider, the following treatment goal was established: decrease emotional eating. [name] has demonstrated progress in  his goal as evidenced by {gbtxprogress:22839}. [name] also {gbtxprogress2:22951}.  Plan: The next appointment will be scheduled in {gbweeks:21758}, which will  be {gbtxmodality:23402}. The next session will focus on {Plan for Next Appointment:23400}.

## 2020-03-21 ENCOUNTER — Encounter: Payer: Self-pay | Admitting: Family Medicine

## 2020-03-27 ENCOUNTER — Other Ambulatory Visit: Payer: Self-pay

## 2020-03-27 ENCOUNTER — Encounter (INDEPENDENT_AMBULATORY_CARE_PROVIDER_SITE_OTHER): Payer: Self-pay | Admitting: Family Medicine

## 2020-03-27 ENCOUNTER — Ambulatory Visit (INDEPENDENT_AMBULATORY_CARE_PROVIDER_SITE_OTHER): Payer: 59 | Admitting: Family Medicine

## 2020-03-27 VITALS — BP 132/76 | HR 60 | Temp 97.7°F | Ht 69.0 in | Wt 279.0 lb

## 2020-03-27 DIAGNOSIS — E7849 Other hyperlipidemia: Secondary | ICD-10-CM

## 2020-03-27 DIAGNOSIS — E8881 Metabolic syndrome: Secondary | ICD-10-CM | POA: Diagnosis not present

## 2020-03-27 DIAGNOSIS — I10 Essential (primary) hypertension: Secondary | ICD-10-CM

## 2020-03-27 DIAGNOSIS — E559 Vitamin D deficiency, unspecified: Secondary | ICD-10-CM

## 2020-03-27 DIAGNOSIS — Z6841 Body Mass Index (BMI) 40.0 and over, adult: Secondary | ICD-10-CM

## 2020-03-27 DIAGNOSIS — F419 Anxiety disorder, unspecified: Secondary | ICD-10-CM

## 2020-03-28 NOTE — Progress Notes (Signed)
Chief Complaint:   OBESITY Hector [name] is here to discuss his progress with his obesity treatment plan along with follow-up of his obesity related diagnoses. Hector [name] is on the Category 2 Plan and states he is following his eating plan approximately 30% of the time. Hector [name] states he is exercising for 0 minutes 0 times per week.  Today's visit was #: 8 Starting weight: 282 lbs Starting date: 01/30/2020 Today's weight: 279 lbs Today's date: 03/27/2020 Total lbs lost to date: 3 lbs Total lbs lost since last in-office visit: 1 lb  Interim History: Hector [name] says he is still not drinking sodas but many of his old habits have continued.  He will be selling his home soon and finalizing his divorce. He is both excited about the change, but anxious. He admits to behavioral addictions.  Subjective:   1. Essential hypertension Review: taking medications as instructed, no medication side effects noted, no chest pain on exertion, no dyspnea on exertion, no swelling of ankles.   BP Readings from Last 3 Encounters:  03/27/20 132/76  03/05/20 138/80  02/27/20 (!) 145/74   2. Other hyperlipidemia Hector [name] has hyperlipidemia and has been trying to improve his cholesterol levels with intensive lifestyle modification including a low saturated fat diet, exercise and weight loss. He denies any chest pain, claudication or myalgias.  Lab Results  Component Value Date   ALT 21 11/16/2019   AST 18 11/16/2019   ALKPHOS 89 11/16/2019   BILITOT 0.3 11/16/2019   Lab Results  Component Value Date   CHOL 136 11/16/2019   HDL 35 (L) 11/16/2019   LDLCALC 81 11/16/2019   TRIG 105 11/16/2019   CHOLHDL 3 06/12/2019   3. Vitamin D deficiency Hector [name]'s Vitamin D level was 17.8 on 11/16/2019. He is currently taking prescription vitamin D 50,000 IU each week. He denies nausea, vomiting or muscle weakness.  4. Insulin resistance Hector [name] has a diagnosis of insulin resistance based on his elevated fasting insulin level >5. He continues  to work on diet and exercise to decrease his risk of diabetes.  Lab Results  Component Value Date   INSULIN 35.9 (H) 11/16/2019   Lab Results  Component Value Date   HGBA1C 5.2 11/16/2019   5. Anxiety, with emotional eating Hector [name] is taking Prozac and Ativan for his anxiety symptoms. These have been helpful.  Assessment/Plan:   1. Essential hypertension Hector [name] is working on healthy weight loss and exercise to improve blood pressure control. We will watch for signs of hypotension as he continues his lifestyle modifications.  2. Other hyperlipidemia Cardiovascular risk and specific lipid/LDL goals reviewed.  We discussed several lifestyle modifications today and Hector [name] will continue to work on diet, exercise and weight loss efforts. Orders and follow up as documented in patient record.   Counseling Intensive lifestyle modifications are the first line treatment for this issue. . Dietary changes: Increase soluble fiber. Decrease simple carbohydrates. . Exercise changes: Moderate to vigorous-intensity aerobic activity 150 minutes per week if tolerated. . Lipid-lowering medications: see documented in medical record.  3. Vitamin D deficiency Low Vitamin D level contributes to fatigue and are associated with obesity, breast, and colon cancer. He agrees to continue to take prescription Vitamin D @50 ,000 IU every week and will follow-up for routine testing of Vitamin D, at least 2-3 times per year to avoid over-replacement.  4. Insulin resistance Hector [name] will continue to work on weight loss, exercise, and decreasing simple carbohydrates to help decrease the risk of diabetes. Hector [name] agreed to  follow-up with [place] as directed to closely monitor his progress.  5. Anxiety, with emotional eating Behavior modification techniques were discussed today to help Hector [name] deal with his anxiety.  Orders and follow up as documented in patient record.   6. Class 3 severe obesity with serious comorbidity and body mass index  (BMI) of 40.0 to 44.9 in adult, unspecified obesity type (HCC) Hector [name] is currently in the action stage of change. As such, his goal is to continue with weight loss efforts. He has agreed to the Category 2 Plan.   Exercise goals: All adults should avoid inactivity. Some physical activity is better than none, and adults who participate in any amount of physical activity gain some health benefits.  Behavioral modification strategies: increasing lean protein intake, decreasing simple carbohydrates, increasing vegetables and increasing water intake.  Hector [name] has agreed to follow-up with our clinic in 2 weeks. He was informed of the importance of frequent follow-up visits to maximize his success with intensive lifestyle modifications for his multiple health conditions.   Objective:   Blood pressure 132/76, pulse 60, temperature 97.7 F (36.5 C), temperature source Oral, height 5' 9"  (1.753 m), weight 279 lb (126.6 kg), SpO2 97 %. Body mass index is 41.2 kg/m.  General: Cooperative, alert, well developed, in no acute distress. HEENT: Conjunctivae and lids unremarkable. Cardiovascular: Regular rhythm.  Lungs: Normal work of breathing. Neurologic: No focal deficits.   Lab Results  Component Value Date   CREATININE 0.98 11/16/2019   BUN 18 11/16/2019   NA 143 11/16/2019   K 4.5 11/16/2019   CL 105 11/16/2019   CO2 25 11/16/2019   Lab Results  Component Value Date   ALT 21 11/16/2019   AST 18 11/16/2019   ALKPHOS 89 11/16/2019   BILITOT 0.3 11/16/2019   Lab Results  Component Value Date   HGBA1C 5.2 11/16/2019   Lab Results  Component Value Date   INSULIN 35.9 (H) 11/16/2019   Lab Results  Component Value Date   TSH 2.410 11/16/2019   Lab Results  Component Value Date   CHOL 136 11/16/2019   HDL 35 (L) 11/16/2019   LDLCALC 81 11/16/2019   TRIG 105 11/16/2019   CHOLHDL 3 06/12/2019   Lab Results  Component Value Date   WBC 6.5 11/16/2019   HGB 15.5 11/16/2019   HCT 44.8  11/16/2019   MCV 88 11/16/2019   PLT 150 11/16/2019   Attestation Statements:   Reviewed by clinician on day of visit: allergies, medications, problem list, medical history, surgical history, family history, social history, and previous encounter notes.  I, Water quality scientist, CMA, am acting as Location manager for [employer], DO.  I have reviewed the above documentation for accuracy and completeness, and I agree with the above. Briscoe Deutscher, DO

## 2020-04-02 ENCOUNTER — Ambulatory Visit (INDEPENDENT_AMBULATORY_CARE_PROVIDER_SITE_OTHER): Payer: Self-pay | Admitting: Psychology

## 2020-04-15 ENCOUNTER — Encounter: Payer: Self-pay | Admitting: Family Medicine

## 2020-04-22 ENCOUNTER — Ambulatory Visit (INDEPENDENT_AMBULATORY_CARE_PROVIDER_SITE_OTHER): Payer: 59 | Admitting: Family Medicine

## 2020-04-28 ENCOUNTER — Other Ambulatory Visit: Payer: Self-pay | Admitting: Family Medicine

## 2020-05-08 ENCOUNTER — Encounter (INDEPENDENT_AMBULATORY_CARE_PROVIDER_SITE_OTHER): Payer: Self-pay | Admitting: Family Medicine

## 2020-05-08 ENCOUNTER — Ambulatory Visit (INDEPENDENT_AMBULATORY_CARE_PROVIDER_SITE_OTHER): Payer: 59 | Admitting: Family Medicine

## 2020-05-08 ENCOUNTER — Other Ambulatory Visit: Payer: Self-pay

## 2020-05-08 VITALS — BP 128/76 | HR 55 | Temp 97.9°F | Ht 69.0 in | Wt 272.0 lb

## 2020-05-08 DIAGNOSIS — Z9189 Other specified personal risk factors, not elsewhere classified: Secondary | ICD-10-CM | POA: Diagnosis not present

## 2020-05-08 DIAGNOSIS — Z6841 Body Mass Index (BMI) 40.0 and over, adult: Secondary | ICD-10-CM

## 2020-05-08 DIAGNOSIS — E559 Vitamin D deficiency, unspecified: Secondary | ICD-10-CM | POA: Diagnosis not present

## 2020-05-08 DIAGNOSIS — R600 Localized edema: Secondary | ICD-10-CM

## 2020-05-08 DIAGNOSIS — F322 Major depressive disorder, single episode, severe without psychotic features: Secondary | ICD-10-CM | POA: Diagnosis not present

## 2020-05-08 MED ORDER — HYDROCHLOROTHIAZIDE 12.5 MG PO CAPS: 12.5000 mg | ORAL_CAPSULE | Freq: Every day | ORAL | 0 refills | Status: DC | PRN

## 2020-05-08 MED ORDER — VITAMIN D (ERGOCALCIFEROL) 1.25 MG (50000 UNIT) PO CAPS: 50000.0000 [IU] | ORAL_CAPSULE | ORAL | 0 refills | Status: DC

## 2020-05-08 NOTE — Progress Notes (Signed)
Chief Complaint:   OBESITY Hector [name] is here to discuss his progress with his obesity treatment plan along with follow-up of his obesity related diagnoses. Aj is on the Category 2 Plan and states he is following his eating plan approximately 20% of the time. Milo states he is exercising for 0 minutes 0 times per week.  Today's visit was #: 9 Starting weight: 282 lbs Starting date: 01/30/2020 Today's weight: 272 lbs Today's date: 05/08/2020 Total lbs lost to date: 10 lbs Total lbs lost since last in-office visit: 7 lbs  Interim History: Jais is down 10 pounds today.  He has finalized his divorce and sold his house.  His daughter has started an ADHD medication. He feels that his stress levels are still very high during this transition. No SI.  Subjective:   1. Vitamin D deficiency Isayah's Vitamin D level was 17.8 on 11/16/2019. He is currently taking prescription vitamin D 50,000 IU each week. He denies nausea, vomiting or muscle weakness.  2. Lower extremity edema Bron's lower extremity edema is mild.  3. Severe depression ([place]) Cephas has severe depression.  4. At risk for heart disease Gayland is at a higher than average risk for cardiovascular disease due to obesity.   Assessment/Plan:   1. Vitamin D deficiency Low Vitamin D level contributes to fatigue and are associated with obesity, breast, and colon cancer. He agrees to continue to take prescription Vitamin D @50 ,000 IU every week and will follow-up for routine testing of Vitamin D, at least 2-3 times per year to avoid over-replacement.  Orders - Vitamin D, Ergocalciferol, (DRISDOL) 1.25 MG (50000 UNIT) CAPS capsule; Take 1 capsule (50,000 Units total) by mouth every 7 (seven) days.  Dispense: 4 capsule; Refill: 0  2. Lower extremity edema Will start Reford on HCTZ 12.5 mg as needed for his lower extremity edema.  Orders - hydrochlorothiazide (MICROZIDE) 12.5 MG capsule; Take 1 capsule (12.5 mg total) by mouth daily as needed  (edema).  Dispense: 30 capsule; Refill: 0  3. Severe depression ([place]) Will place referral to Behavioral Health to help [place] with his depression symptoms.  Orders - Ambulatory referral to Psychiatry - Ambulatory referral to [facility]  4. At risk for heart disease Bralyn was given approximately 15 minutes of coronary artery disease prevention counseling today. He is 49 y.o. male and has risk factors for heart disease including obesity. We discussed intensive lifestyle modifications today with an emphasis on specific weight loss instructions and strategies.   Repetitive spaced learning was employed today to elicit superior memory formation and behavioral change.  5. Class 3 severe obesity with serious comorbidity and body mass index (BMI) of 40.0 to 44.9 in adult, unspecified obesity type (HCC) Fadi is currently in the action stage of change. As such, his goal is to continue with weight loss efforts. He has agreed to the Category 2 Plan.   Exercise goals: For substantial health benefits, adults should do at least 150 minutes (2 hours and 30 minutes) a week of moderate-intensity, or 75 minutes (1 hour and 15 minutes) a week of vigorous-intensity aerobic physical activity, or an equivalent combination of moderate- and vigorous-intensity aerobic activity. Aerobic activity should be performed in episodes of at least 10 minutes, and preferably, it should be spread throughout the week.  Behavioral modification strategies: increasing water intake and decreasing liquid calories.  Hao has agreed to follow-up with our clinic in 2 weeks. He was informed of the importance of frequent follow-up visits to maximize  his success with intensive lifestyle modifications for his multiple health conditions.   Objective:   Blood pressure 128/76, pulse (!) 55, temperature 97.9 F (36.6 C), temperature source Oral, height 5' 9"  (1.753 m), weight 272 lb (123.4 kg), SpO2 96 %. Body mass index is 40.17  kg/m.  General: Cooperative, alert, well developed, in no acute distress. HEENT: Conjunctivae and lids unremarkable. Cardiovascular: Regular rhythm.  Lungs: Normal work of breathing. Neurologic: No focal deficits.   Lab Results  Component Value Date   CREATININE 0.98 11/16/2019   BUN 18 11/16/2019   NA 143 11/16/2019   K 4.5 11/16/2019   CL 105 11/16/2019   CO2 25 11/16/2019   Lab Results  Component Value Date   ALT 21 11/16/2019   AST 18 11/16/2019   ALKPHOS 89 11/16/2019   BILITOT 0.3 11/16/2019   Lab Results  Component Value Date   HGBA1C 5.2 11/16/2019   Lab Results  Component Value Date   INSULIN 35.9 (H) 11/16/2019   Lab Results  Component Value Date   TSH 2.410 11/16/2019   Lab Results  Component Value Date   CHOL 136 11/16/2019   HDL 35 (L) 11/16/2019   LDLCALC 81 11/16/2019   TRIG 105 11/16/2019   CHOLHDL 3 06/12/2019   Lab Results  Component Value Date   WBC 6.5 11/16/2019   HGB 15.5 11/16/2019   HCT 44.8 11/16/2019   MCV 88 11/16/2019   PLT 150 11/16/2019   Attestation Statements:   Reviewed by clinician on day of visit: allergies, medications, problem list, medical history, surgical history, family history, social history, and previous encounter notes.  I, Water quality scientist, CMA, am acting as Location manager for [employer], DO.  I have reviewed the above documentation for accuracy and completeness, and I agree with the above. Briscoe Deutscher, DO

## 2020-05-30 ENCOUNTER — Other Ambulatory Visit (INDEPENDENT_AMBULATORY_CARE_PROVIDER_SITE_OTHER): Payer: Self-pay | Admitting: Family Medicine

## 2020-05-30 DIAGNOSIS — E559 Vitamin D deficiency, unspecified: Secondary | ICD-10-CM

## 2020-06-01 ENCOUNTER — Other Ambulatory Visit (INDEPENDENT_AMBULATORY_CARE_PROVIDER_SITE_OTHER): Payer: Self-pay | Admitting: Family Medicine

## 2020-06-01 DIAGNOSIS — R600 Localized edema: Secondary | ICD-10-CM

## 2020-06-05 ENCOUNTER — Ambulatory Visit (INDEPENDENT_AMBULATORY_CARE_PROVIDER_SITE_OTHER): Payer: No Typology Code available for payment source | Admitting: Family Medicine

## 2020-06-05 ENCOUNTER — Other Ambulatory Visit: Payer: Self-pay

## 2020-06-05 ENCOUNTER — Encounter (INDEPENDENT_AMBULATORY_CARE_PROVIDER_SITE_OTHER): Payer: Self-pay | Admitting: Family Medicine

## 2020-06-05 VITALS — BP 114/63 | HR 52 | Temp 98.1°F | Ht 69.0 in | Wt 266.0 lb

## 2020-06-05 DIAGNOSIS — Z9189 Other specified personal risk factors, not elsewhere classified: Secondary | ICD-10-CM

## 2020-06-05 DIAGNOSIS — Z6839 Body mass index (BMI) 39.0-39.9, adult: Secondary | ICD-10-CM

## 2020-06-05 DIAGNOSIS — E7849 Other hyperlipidemia: Secondary | ICD-10-CM | POA: Diagnosis not present

## 2020-06-05 DIAGNOSIS — E559 Vitamin D deficiency, unspecified: Secondary | ICD-10-CM

## 2020-06-05 DIAGNOSIS — F3289 Other specified depressive episodes: Secondary | ICD-10-CM

## 2020-06-05 DIAGNOSIS — I10 Essential (primary) hypertension: Secondary | ICD-10-CM

## 2020-06-05 MED ORDER — VITAMIN D (ERGOCALCIFEROL) 1.25 MG (50000 UNIT) PO CAPS: 50000.0000 [IU] | ORAL_CAPSULE | ORAL | 0 refills | Status: DC

## 2020-06-05 NOTE — Progress Notes (Signed)
Chief Complaint:   OBESITY Hector [name] is here to discuss his progress with his obesity treatment plan along with follow-up of his obesity related diagnoses. Hector [name] is on the Category 2 Plan and states he is following his eating plan approximately 25% of the time. Hector [name] states he is moving.  Today's visit was #: 10 Starting weight: 282 lbs Starting date: 01/30/2020 Today's weight: 266 lbs Today's date: 06/05/2020 Total lbs lost to date: 26 lbs Total lbs lost since last in-office visit: 6 lbs  Interim History: Hector [name] is due for labs today, but he is not fasting. He feels much less stress - sold house, extra money to pay off bills and buy needed items. Thinking that he may be ready to try dating.  Subjective:   1. Vitamin D deficiency Hector [name]'s Vitamin D level was 17.8 on 11/16/2019. He is currently taking prescription vitamin D 50,000 IU each week. He denies nausea, vomiting or muscle weakness.  2. Essential hypertension Review: taking medications as instructed, no medication side effects noted, no chest pain on exertion, no dyspnea on exertion, no swelling of ankles.  He is taking losartan and Bystolic.  Blood pressure is at goal.  BP Readings from Last 3 Encounters:  06/05/20 114/63  05/08/20 128/76  03/27/20 132/76   3. Other hyperlipidemia Hector [name] has hyperlipidemia and has been trying to improve his cholesterol levels with intensive lifestyle modification including a low saturated fat diet, exercise and weight loss. He denies any chest pain, claudication or myalgias.  He is taking Lipitor 40 mg daily.  Lab Results  Component Value Date   ALT 21 11/16/2019   AST 18 11/16/2019   ALKPHOS 89 11/16/2019   BILITOT 0.3 11/16/2019   Lab Results  Component Value Date   CHOL 136 11/16/2019   HDL 35 (L) 11/16/2019   LDLCALC 81 11/16/2019   TRIG 105 11/16/2019   CHOLHDL 3 06/12/2019   4. Other depression, emotional eating Hector [name] is struggling with emotional eating and using food for comfort to  the extent that it is negatively impacting his health. He has been working on behavior modification techniques to help reduce his emotional eating and has been successful. He shows no sign of suicidal or homicidal ideations.  He is taking Prozac 10 mg daily.  Assessment/Plan:   1. Vitamin D deficiency Low Vitamin D level contributes to fatigue and are associated with obesity, breast, and colon cancer. He agrees to continue to take prescription Vitamin D @50 ,000 IU every week and will follow-up for routine testing of Vitamin D, at least 2-3 times per year to avoid over-replacement.  Orders - Vitamin D, Ergocalciferol, (DRISDOL) 1.25 MG (50000 UNIT) CAPS capsule; Take 1 capsule (50,000 Units total) by mouth every 7 (seven) days.  Dispense: 4 capsule; Refill: 0  2. Essential hypertension Hector [name] is working on healthy weight loss and exercise to improve blood pressure control. We will watch for signs of hypotension as he continues his lifestyle modifications.  3. Other hyperlipidemia Cardiovascular risk and specific lipid/LDL goals reviewed.  We discussed several lifestyle modifications today and Hector [name] will continue to work on diet, exercise and weight loss efforts. Orders and follow up as documented in patient record.   Counseling Intensive lifestyle modifications are the first line treatment for this issue. . Dietary changes: Increase soluble fiber. Decrease simple carbohydrates. . Exercise changes: Moderate to vigorous-intensity aerobic activity 150 minutes per week if tolerated. . Lipid-lowering medications: see documented in medical record.  4. Other depression, emotional eating  Behavior modification techniques were discussed today to help Hector [name] deal with his emotional/non-hunger eating behaviors.  Orders and follow up as documented in patient record.  He says that this is much improved with his improved situation.  5. At risk for heart disease Hector [name] was given approximately 15 minutes of  coronary artery disease prevention counseling today. He is 49 y.o. male and has risk factors for heart disease including obesity. We discussed intensive lifestyle modifications today with an emphasis on specific weight loss instructions and strategies.   Repetitive spaced learning was employed today to elicit superior memory formation and behavioral change.  6. Class 2 severe obesity with serious comorbidity and body mass index (BMI) of 39.0 to 39.9 in adult, unspecified obesity type (HCC) Hector [name] is currently in the action stage of change. As such, his goal is to continue with weight loss efforts. He has agreed to practicing portion control and making smarter food choices, such as increasing vegetables and decreasing simple carbohydrates.   Exercise goals: For substantial health benefits, adults should do at least 150 minutes (2 hours and 30 minutes) a week of moderate-intensity, or 75 minutes (1 hour and 15 minutes) a week of vigorous-intensity aerobic physical activity, or an equivalent combination of moderate- and vigorous-intensity aerobic activity. Aerobic activity should be performed in episodes of at least 10 minutes, and preferably, it should be spread throughout the week.  Behavioral modification strategies: increasing lean protein intake, decreasing simple carbohydrates and increasing vegetables.  Hector [name] has agreed to follow-up with our clinic in 3 weeks. He was informed of the importance of frequent follow-up visits to maximize his success with intensive lifestyle modifications for his multiple health conditions.   Objective:   Blood pressure 114/63, pulse (!) 52, temperature 98.1 F (36.7 C), temperature source Oral, height 5' 9"  (1.753 m), weight 266 lb (120.7 kg), SpO2 97 %. Body mass index is 39.28 kg/m.  General: Cooperative, alert, well developed, in no acute distress. HEENT: Conjunctivae and lids unremarkable. Cardiovascular: Regular rhythm.  Lungs: Normal work of  breathing. Neurologic: No focal deficits.   Lab Results  Component Value Date   CREATININE 0.98 11/16/2019   BUN 18 11/16/2019   NA 143 11/16/2019   K 4.5 11/16/2019   CL 105 11/16/2019   CO2 25 11/16/2019   Lab Results  Component Value Date   ALT 21 11/16/2019   AST 18 11/16/2019   ALKPHOS 89 11/16/2019   BILITOT 0.3 11/16/2019   Lab Results  Component Value Date   HGBA1C 5.2 11/16/2019   Lab Results  Component Value Date   INSULIN 35.9 (H) 11/16/2019   Lab Results  Component Value Date   TSH 2.410 11/16/2019   Lab Results  Component Value Date   CHOL 136 11/16/2019   HDL 35 (L) 11/16/2019   LDLCALC 81 11/16/2019   TRIG 105 11/16/2019   CHOLHDL 3 06/12/2019   Lab Results  Component Value Date   WBC 6.5 11/16/2019   HGB 15.5 11/16/2019   HCT 44.8 11/16/2019   MCV 88 11/16/2019   PLT 150 11/16/2019   Attestation Statements:   Reviewed by clinician on day of visit: allergies, medications, problem list, medical history, surgical history, family history, social history, and previous encounter notes.  I, Water quality scientist, CMA, am acting as transcriptionist for Briscoe Deutscher, DO  I have reviewed the above documentation for accuracy and completeness, and I agree with the above. Briscoe Deutscher, DO

## 2020-06-14 ENCOUNTER — Other Ambulatory Visit: Payer: Self-pay | Admitting: Family Medicine

## 2020-06-14 ENCOUNTER — Encounter: Payer: Self-pay | Admitting: Family Medicine

## 2020-06-26 ENCOUNTER — Other Ambulatory Visit: Payer: Self-pay

## 2020-06-26 ENCOUNTER — Ambulatory Visit (INDEPENDENT_AMBULATORY_CARE_PROVIDER_SITE_OTHER): Payer: No Typology Code available for payment source | Admitting: Family Medicine

## 2020-06-26 ENCOUNTER — Encounter (INDEPENDENT_AMBULATORY_CARE_PROVIDER_SITE_OTHER): Payer: Self-pay | Admitting: Family Medicine

## 2020-06-26 VITALS — BP 105/67 | HR 66 | Temp 97.9°F | Ht 69.0 in | Wt 266.0 lb

## 2020-06-26 DIAGNOSIS — I10 Essential (primary) hypertension: Secondary | ICD-10-CM | POA: Diagnosis not present

## 2020-06-26 DIAGNOSIS — Z9189 Other specified personal risk factors, not elsewhere classified: Secondary | ICD-10-CM | POA: Diagnosis not present

## 2020-06-26 DIAGNOSIS — E559 Vitamin D deficiency, unspecified: Secondary | ICD-10-CM | POA: Diagnosis not present

## 2020-06-26 DIAGNOSIS — Z6839 Body mass index (BMI) 39.0-39.9, adult: Secondary | ICD-10-CM | POA: Diagnosis not present

## 2020-06-27 MED ORDER — SEMAGLUTIDE(0.25 OR 0.5MG/DOS) 2 MG/1.5ML ~~LOC~~ SOPN: 0.2500 mg | PEN_INJECTOR | SUBCUTANEOUS | 0 refills | Status: DC

## 2020-06-27 MED ORDER — UNABLE TO FIND: 0.2500 mg | 0 refills | Status: DC

## 2020-06-27 NOTE — Progress Notes (Signed)
Chief Complaint:   OBESITY Mcdaniel is here to discuss his progress with his obesity treatment plan along with follow-up of his obesity related diagnoses. Quame is on the Category 2 Plan and states he is following his eating plan approximately 20% of the time. Guinn states he is exercising for 0 minutes 0 times per week.  Today's visit was #: 11 Starting weight: 282 lbs Starting date: 01/30/2020 Today's weight: 266 lbs Today's date: 06/26/2020 Total lbs lost to date: 26 lbs Total lbs lost since last in-office visit: 0  Interim History: Kristjan is currently stable.  Subjective:   1. Essential hypertension Review: taking medications as instructed, no medication side effects noted, no chest pain on exertion, no dyspnea on exertion, no swelling of ankles.  Blood pressure is stable.  BP Readings from Last 3 Encounters:  06/26/20 105/67  06/05/20 114/63  05/08/20 128/76   2. At risk for nausea Zayvon is at risk for nausea due to starting a new medication [facility]).  Assessment/Plan:   1. Essential hypertension Dia is working on healthy weight loss and exercise to improve blood pressure control. We will watch for signs of hypotension as he continues his lifestyle modifications.  2. At risk for nausea [name] was given approximately 15 minutes of nausea prevention counseling today. Khylin is at risk for nausea due to his new or current medication. He was encouraged to titrate his medication slowly, make sure to stay hydrated, eat smaller portions throughout the day, and avoid high fat meals.   3. Class 2 severe obesity with serious comorbidity and body mass index (BMI) of 39.0 to 39.9 in adult, unspecified obesity type ([place])  Orders - UNABLE TO FIND; Inject 0.25 mg into the skin once a week. Med Name: Mancel Parsons  Dispense: 4 pen; Refill: 0  Dejaun is currently in the action stage of change. As such, his goal is to continue with weight loss efforts. He has agreed to the Category 2 Plan.    Exercise goals: For substantial health benefits, adults should do at least 150 minutes (2 hours and 30 minutes) a week of moderate-intensity, or 75 minutes (1 hour and 15 minutes) a week of vigorous-intensity aerobic physical activity, or an equivalent combination of moderate- and vigorous-intensity aerobic activity. Aerobic activity should be performed in episodes of at least 10 minutes, and preferably, it should be spread throughout the week. Couch exercises given.  Behavioral modification strategies: increasing lean protein intake.  Doak has agreed to follow-up with our clinic in 3 weeks. He was informed of the importance of frequent follow-up visits to maximize his success with intensive lifestyle modifications for his multiple health conditions.   Objective:   Blood pressure 105/67, pulse 66, temperature 97.9 F (36.6 C), temperature source Oral, height 5' 9"  (1.753 m), weight 266 lb (120.7 kg), SpO2 95 %. Body mass index is 39.28 kg/m.  General: Cooperative, alert, well developed, in no acute distress. HEENT: Conjunctivae and lids unremarkable. Cardiovascular: Regular rhythm.  Lungs: Normal work of breathing. Neurologic: No focal deficits.   Lab Results  Component Value Date   CREATININE 0.98 11/16/2019   BUN 18 11/16/2019   NA 143 11/16/2019   K 4.5 11/16/2019   CL 105 11/16/2019   CO2 25 11/16/2019   Lab Results  Component Value Date   ALT 21 11/16/2019   AST 18 11/16/2019   ALKPHOS 89 11/16/2019   BILITOT 0.3 11/16/2019   Lab Results  Component Value Date   HGBA1C  5.2 11/16/2019   Lab Results  Component Value Date   INSULIN 35.9 (H) 11/16/2019   Lab Results  Component Value Date   TSH 2.410 11/16/2019   Lab Results  Component Value Date   CHOL 136 11/16/2019   HDL 35 (L) 11/16/2019   LDLCALC 81 11/16/2019   TRIG 105 11/16/2019   CHOLHDL 3 06/12/2019   Lab Results  Component Value Date   WBC 6.5 11/16/2019   HGB 15.5 11/16/2019   HCT 44.8  11/16/2019   MCV 88 11/16/2019   PLT 150 11/16/2019   Attestation Statements:   Reviewed by clinician on day of visit: allergies, medications, problem list, medical history, surgical history, family history, social history, and previous encounter notes.  I, Water quality scientist, CMA, am acting as transcriptionist for Briscoe Deutscher, DO  I have reviewed the above documentation for accuracy and completeness, and I agree with the above. Briscoe Deutscher, DO

## 2020-07-01 ENCOUNTER — Other Ambulatory Visit (INDEPENDENT_AMBULATORY_CARE_PROVIDER_SITE_OTHER): Payer: Self-pay | Admitting: Family Medicine

## 2020-07-01 DIAGNOSIS — E559 Vitamin D deficiency, unspecified: Secondary | ICD-10-CM

## 2020-07-03 ENCOUNTER — Encounter (INDEPENDENT_AMBULATORY_CARE_PROVIDER_SITE_OTHER): Payer: Self-pay

## 2020-07-03 ENCOUNTER — Other Ambulatory Visit (INDEPENDENT_AMBULATORY_CARE_PROVIDER_SITE_OTHER): Payer: Self-pay | Admitting: Family Medicine

## 2020-07-03 DIAGNOSIS — R600 Localized edema: Secondary | ICD-10-CM

## 2020-07-03 NOTE — Telephone Encounter (Signed)
RX not requested at recent OV

## 2020-07-04 ENCOUNTER — Encounter (INDEPENDENT_AMBULATORY_CARE_PROVIDER_SITE_OTHER): Payer: Self-pay | Admitting: Family Medicine

## 2020-07-05 ENCOUNTER — Other Ambulatory Visit (INDEPENDENT_AMBULATORY_CARE_PROVIDER_SITE_OTHER): Payer: Self-pay | Admitting: Family Medicine

## 2020-07-05 DIAGNOSIS — R600 Localized edema: Secondary | ICD-10-CM

## 2020-07-05 DIAGNOSIS — E559 Vitamin D deficiency, unspecified: Secondary | ICD-10-CM

## 2020-07-08 NOTE — Telephone Encounter (Signed)
Please review

## 2020-07-21 ENCOUNTER — Encounter (INDEPENDENT_AMBULATORY_CARE_PROVIDER_SITE_OTHER): Payer: Self-pay

## 2020-07-22 ENCOUNTER — Ambulatory Visit (INDEPENDENT_AMBULATORY_CARE_PROVIDER_SITE_OTHER): Payer: No Typology Code available for payment source | Admitting: Family Medicine

## 2020-07-29 ENCOUNTER — Encounter (INDEPENDENT_AMBULATORY_CARE_PROVIDER_SITE_OTHER): Payer: Self-pay | Admitting: Family Medicine

## 2020-07-29 ENCOUNTER — Other Ambulatory Visit: Payer: Self-pay

## 2020-07-29 ENCOUNTER — Ambulatory Visit (INDEPENDENT_AMBULATORY_CARE_PROVIDER_SITE_OTHER): Payer: No Typology Code available for payment source | Admitting: Family Medicine

## 2020-07-29 VITALS — BP 125/74 | HR 57 | Temp 97.7°F | Ht 69.0 in | Wt 270.0 lb

## 2020-07-29 DIAGNOSIS — Z6839 Body mass index (BMI) 39.0-39.9, adult: Secondary | ICD-10-CM

## 2020-07-29 DIAGNOSIS — L299 Pruritus, unspecified: Secondary | ICD-10-CM | POA: Diagnosis not present

## 2020-07-29 DIAGNOSIS — E559 Vitamin D deficiency, unspecified: Secondary | ICD-10-CM

## 2020-07-29 DIAGNOSIS — Z9189 Other specified personal risk factors, not elsewhere classified: Secondary | ICD-10-CM | POA: Diagnosis not present

## 2020-07-29 MED ORDER — WEGOVY 0.25 MG/0.5ML ~~LOC~~ SOAJ: 0.2500 mg | SUBCUTANEOUS | 0 refills | Status: DC

## 2020-07-29 MED ORDER — VITAMIN D (ERGOCALCIFEROL) 1.25 MG (50000 UNIT) PO CAPS: 50000.0000 [IU] | ORAL_CAPSULE | ORAL | 0 refills | Status: DC

## 2020-07-29 MED ORDER — SEMAGLUTIDE(0.25 OR 0.5MG/DOS) 2 MG/1.5ML ~~LOC~~ SOPN: 0.2500 mg | PEN_INJECTOR | SUBCUTANEOUS | 0 refills | Status: DC

## 2020-07-30 NOTE — Progress Notes (Signed)
Chief Complaint:   OBESITY Hector [name] is here to discuss his progress with his obesity treatment plan along with follow-up of his obesity related diagnoses. Hector [name] is on the Category 2 Plan and states he is following his eating plan approximately 20% of the time. Hector [name] states he is doing 0 minutes 0 times per week.  Today's visit was #: 12 Starting weight: 282 lbs Starting date: 01/30/2020 Today's weight: 270 lbs Today's date: 07/29/2020 Total lbs lost to date: 12 Total lbs lost since last in-office visit: 0  Interim History: Hector [name] has been unable to get [facility] due to availability. He reports he is not following the Category 2 plan much. He is eating out more since he moves recently to an area with more restaurants close by.  Subjective:   1. Vitamin D deficiency Dadrian's last Vit D level was low at 17.8. He is on prescription Vit D weekly.  2. Pruritus left lower leg Hector [name] notes red pruritic rash on left lower leg that he has had for 3 months. 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 He reports a prescription steroid cream has worked well in the past.   3. At risk for side effect of medication Hector [name] is at risk for drug side effects due to [facility].  Assessment/Plan:   1. Vitamin D deficiency Low Vitamin D level contributes to fatigue and are associated with obesity, breast, and colon cancer. We will refill prescription Vitamin D for 1 month. Lar[place]ie will follow-up for routine testing of Vitamin D, at least 2-3 times per year to avoid over-replacement.  - Vitamin D, Ergocalciferol, (DRISDOL) 1.25 MG (50000 UNIT) CAPS capsule; Take 1 capsule (50,000 Units total) by mouth every 7 (seven) days.  Dispense: 4 capsule; Refill: 0  2. Pruritus left lower leg Hector [name] will call his primary care physician about this. He will try benadryl at night and Zyrtec or Claritin during the day. He may also apply ice packs to his leg intermittently.   3. At risk for side effect of medication Hector [name] was given  approximately 15 minutes of drug side effect counseling today.  We discussed side effect possibility and risk versus benefits. Hector [name] agreed to the medication and will contact this office if these side effects are intolerable.  Repetitive spaced learning was employed today to elicit superior memory formation and behavioral change.  4. Class 2 severe obesity with serious comorbidity and body mass index (BMI) of 39.0 to 39.9 in adult, unspecified obesity type (HCC) Hector [name] is currently in the action stage of change. As such, his goal is to continue with weight loss efforts. He has agreed to the Category 2 Plan.   We discussed various medication options to help An with his weight loss efforts and we both agreed to continue [facility]. We will send Wegovy to [facility] where they have a supply.  - Semaglutide-Weight Management (WEGOVY) 0.25 MG/0.5ML SOAJ; Inject 0.25 mg into the skin once a week.  Dispense: 2 mL; Refill: 0  Exercise goals: All adults should avoid inactivity. Some physical activity is better than none, and adults who participate in any amount of physical activity gain some health benefits.  Behavioral modification strategies: increasing lean protein intake, decreasing simple carbohydrates and decreasing eating out.  Hector [name] has agreed to follow-up with our clinic in 3 weeks. He was informed of the importance of frequent follow-up visits to maximize his success with intensive lifestyle modifications for his multiple health conditions.   Objective:   Blood  pressure 125/74, pulse (!) 57, temperature 97.7 F (36.5 C), temperature source Oral, height 5' 9"  (1.753 m), weight 270 lb (122.5 kg), SpO2 97 %. Body mass index is 39.87 kg/m.  General: Cooperative, alert, well developed, in no acute distress. HEENT: Conjunctivae and lids unremarkable. Cardiovascular: Regular rhythm.  Lungs: Normal work of breathing. Neurologic: No focal deficits.   Lab Results  Component Value Date    CREATININE 0.98 11/16/2019   BUN 18 11/16/2019   NA 143 11/16/2019   K 4.5 11/16/2019   CL 105 11/16/2019   CO2 25 11/16/2019   Lab Results  Component Value Date   ALT 21 11/16/2019   AST 18 11/16/2019   ALKPHOS 89 11/16/2019   BILITOT 0.3 11/16/2019   Lab Results  Component Value Date   HGBA1C 5.2 11/16/2019   Lab Results  Component Value Date   INSULIN 35.9 (H) 11/16/2019   Lab Results  Component Value Date   TSH 2.410 11/16/2019   Lab Results  Component Value Date   CHOL 136 11/16/2019   HDL 35 (L) 11/16/2019   LDLCALC 81 11/16/2019   TRIG 105 11/16/2019   CHOLHDL 3 06/12/2019   Lab Results  Component Value Date   WBC 6.5 11/16/2019   HGB 15.5 11/16/2019   HCT 44.8 11/16/2019   MCV 88 11/16/2019   PLT 150 11/16/2019   No results found for: IRON, TIBC, FERRITIN  Attestation Statements:   Reviewed by clinician on day of visit: allergies, medications, problem list, medical history, surgical history, family history, social history, and previous encounter notes.   Wilhemena Durie, am acting as Location manager for [employer], FNP-C.  I have reviewed the above documentation for accuracy and completeness, and I agree with the above. -  Georgianne Fick, FNP

## 2020-08-07 ENCOUNTER — Other Ambulatory Visit: Payer: Self-pay

## 2020-08-07 ENCOUNTER — Emergency Department (HOSPITAL_COMMUNITY): Payer: No Typology Code available for payment source

## 2020-08-07 ENCOUNTER — Encounter (HOSPITAL_COMMUNITY): Payer: Self-pay | Admitting: *Deleted

## 2020-08-07 ENCOUNTER — Emergency Department (HOSPITAL_COMMUNITY)
Admission: EM | Admit: 2020-08-07 | Discharge: 2020-08-08 | Disposition: A | Discharge status: Home / Self Care | Payer: No Typology Code available for payment source | Attending: Emergency Medicine | Admitting: Emergency Medicine

## 2020-08-07 DIAGNOSIS — J45909 Unspecified asthma, uncomplicated: Secondary | ICD-10-CM | POA: Diagnosis not present

## 2020-08-07 DIAGNOSIS — E119 Type 2 diabetes mellitus without complications: Secondary | ICD-10-CM | POA: Insufficient documentation

## 2020-08-07 DIAGNOSIS — R079 Chest pain, unspecified: Secondary | ICD-10-CM | POA: Insufficient documentation

## 2020-08-07 DIAGNOSIS — I10 Essential (primary) hypertension: Secondary | ICD-10-CM | POA: Insufficient documentation

## 2020-08-07 DIAGNOSIS — Z79899 Other long term (current) drug therapy: Secondary | ICD-10-CM | POA: Insufficient documentation

## 2020-08-07 LAB — TROPONIN I (HIGH SENSITIVITY)
Troponin I (High Sensitivity): 13 ng/L (ref <18)
Troponin I (High Sensitivity): 13 ng/L (ref <18)

## 2020-08-07 LAB — CBC
HCT: 44.2 % (ref 39.0–52.0)
Hemoglobin: 15.2 g/dL (ref 13.0–17.0)
MCH: 29.5 pg (ref 26.0–34.0)
MCHC: 34.4 g/dL (ref 30.0–36.0)
MCV: 85.8 fL (ref 80.0–100.0)
Platelets: 180 10*3/uL (ref 150–400)
RBC: 5.15 MIL/uL (ref 4.22–5.81)
RDW: 12.2 % (ref 11.5–15.5)
WBC: 4.5 10*3/uL (ref 4.0–10.5)
nRBC: 0 % (ref 0.0–0.2)

## 2020-08-07 LAB — BASIC METABOLIC PANEL
Anion gap: 13 (ref 5–15)
BUN: 25 mg/dL — ABNORMAL HIGH (ref 6–20)
CO2: 21 mmol/L — ABNORMAL LOW (ref 22–32)
Calcium: 9.5 mg/dL (ref 8.9–10.3)
Chloride: 103 mmol/L (ref 98–111)
Creatinine, Ser: 1.35 mg/dL — ABNORMAL HIGH (ref 0.61–1.24)
GFR calc Af Amer: >60 mL/min (ref >60)
GFR calc non Af Amer: >60 mL/min (ref >60)
Glucose, Bld: 171 mg/dL — ABNORMAL HIGH (ref 70–99)
Potassium: 3.9 mmol/L (ref 3.5–5.1)
Sodium: 137 mmol/L (ref 135–145)

## 2020-08-07 MED ORDER — LIDOCAINE VISCOUS HCL 2 % MT SOLN
15.0000 mL | Freq: Once | OROMUCOSAL | Status: AC
  Administered 2020-08-07: 15 mL via ORAL
  Filled 2020-08-07: qty 15

## 2020-08-07 MED ORDER — ALUM & MAG HYDROXIDE-SIMETH 200-200-20 MG/5ML PO SUSP
30.0000 mL | Freq: Once | ORAL | Status: AC
  Administered 2020-08-07: 30 mL via ORAL
  Filled 2020-08-07: qty 30

## 2020-08-07 NOTE — ED Triage Notes (Signed)
Pt reports having mid chest tightness x 3 days with same sensation to bilateral arms and into his palms. No resp distress is noted at triage.

## 2020-08-08 ENCOUNTER — Encounter: Payer: Self-pay | Admitting: Family Medicine

## 2020-08-08 ENCOUNTER — Telehealth: Payer: Self-pay | Admitting: Interventional Cardiology

## 2020-08-08 MED ORDER — NITROGLYCERIN 0.4 MG SL SUBL
0.4000 mg | SUBLINGUAL_TABLET | SUBLINGUAL | 3 refills | Status: DC | PRN
Start: 2020-08-08 — End: 2020-09-09

## 2020-08-08 NOTE — ED Provider Notes (Signed)
[facility] EMERGENCY DEPARTMENT Provider Note   CSN: [id number] Arrival date & time: 08/07/20  1220     History Chief Complaint  Patient presents with  . Chest Pain    Hector [name] is a 49 y.o. male.  Patient with history of HTN, HLD, morbid obesity presents for evaluation of intermittent central chest pain x 3-4 days that is worse at night with certain lying positions. It improves when he raises to an incline. No SOB, nausea, vomiting. He denies significant history of heartburn or stomach issues. Yesterday he had an episode of pain while walking around a retail store and getting frustrated with his daughter.   The history is provided by the patient. No language interpreter was used.  Chest Pain Associated symptoms: no abdominal pain, no fever, no nausea and no shortness of breath        Past Medical History:  Diagnosis Date  . Alcohol abuse   . Allergy   . Anal fissure   . Anxiety   . Asthma   . Back pain   . Bilateral swelling of feet   . Blood in stool   . Chest pain   . Depression   . Drug use   . Febrile seizure ([place])   . Heartburn   . Hyperlipidemia   . Hypertension   . Irritable bowel syndrome with diarrhea   . Jaundice, newborn   . Palpitations   . Panic   . Panic disorder   . Pre-diabetes   . Seizures (HCC)    febrile  . Shortness of breath   . Sleep apnea     Patient Active Problem List   Diagnosis Date Noted  . Elevated cholesterol 09/02/2016  . Class 2 severe obesity with serious comorbidity and body mass index (BMI) of 39.0 to 39.9 in adult ([place]) 01/22/2014  . Depression 11/22/2012  . History of anal fissures 11/22/2012  . GERD (gastroesophageal reflux disease) 11/22/2012  . Panic disorder 11/22/2012  . Hypertension 11/22/2012  . History of febrile seizure 11/22/2012    Past Surgical History:  Procedure Laterality Date  . HEMORRHOID BANDING    . WISDOM TOOTH EXTRACTION  1991       Family History  Problem  Relation Age of Onset  . Dementia Mother   . Stroke Mother   . Hypertension Mother   . Skin cancer Mother   . Basal cell carcinoma Mother   . Squamous cell carcinoma Mother   . Cancer Mother   . Depression Mother   . Diabetes Father   . Hyperlipidemia Father   . Stroke Father   . Heart disease Father 41  . Colon polyps Father   . Heart attack Father   . Hypertension Father   . Depression Father   . Sleep apnea Father   . Obesity Father   . Colitis Brother   . Colon polyps Brother     Social History   Tobacco Use  . Smoking status: Never Smoker  . Smokeless tobacco: Never Used  Vaping Use  . Vaping Use: Former  Substance Use Topics  . Alcohol use: Yes    Alcohol/week: 0.0 standard drinks    Comment: 1-5 per week  . Drug use: Yes    Types: Marijuana    Home Medications Prior to Admission medications   Medication Sig Start Date End Date Taking? Authorizing Provider  atorvastatin (LIPITOR) 40 MG tablet Take 1 tablet (40 mg total) by mouth daily. 12/15/18  Yes  Imogene Burn, PA-C  FLUoxetine (PROZAC) 10 MG capsule TAKE 1 CAPSULE DAILY Patient taking differently: Take 10 mg by mouth daily. TAKE 1 CAPSULE DAILY 04/30/20  Yes Burchette, Alinda Sierras, MD  LORazepam (ATIVAN) 1 MG tablet Take 1 tablet (1 mg total) by mouth every 8 (eight) hours as needed for anxiety. 03/05/20  Yes Burchette, Alinda Sierras, MD  losartan (COZAAR) 50 MG tablet Take 1 tablet (50 mg total) by mouth daily. 03/05/20  Yes Burchette, Alinda Sierras, MD  melatonin 3 MG TABS tablet Take 3 mg by mouth at bedtime as needed (sleep).   Yes [provider]  nebivolol (BYSTOLIC) 10 MG tablet TAKE 1 TABLET BY MOUTH ONCE DAILY. NEED APPT FOR REFILLS Patient taking differently: Take 10 mg by mouth daily. TAKE 1 TABLET BY MOUTH ONCE DAILY. NEED APPT FOR REFILLS 06/14/20  Yes Burchette, Alinda Sierras, MD  Vitamin D, Ergocalciferol, (DRISDOL) 1.25 MG (50000 UNIT) CAPS capsule Take 1 capsule (50,000 Units total) by mouth every 7 (seven)  days. 07/29/20  Yes Whitmire, Dawn W, FNP  hydrochlorothiazide (MICROZIDE) 12.5 MG capsule TAKE 1 CAPSULE (12.5 MG TOTAL) BY MOUTH DAILY AS NEEDED (EDEMA). Patient not taking: Reported on 08/08/2020 07/03/20   Briscoe Deutscher, DO  Semaglutide-Weight Management (WEGOVY) 0.25 MG/0.5ML SOAJ Inject 0.25 mg into the skin once a week. 07/29/20   Whitmire, Dawn W, FNP  hydrochlorothiazide (MICROZIDE) 12.5 MG capsule TAKE 1 CAPSULE (12.5 MG TOTAL) BY MOUTH DAILY AS NEEDED (EDEMA). 06/03/20   Briscoe Deutscher, DO    Allergies    Patient has no known allergies.  Review of Systems   Review of Systems  Constitutional: Negative for chills and fever.  Respiratory: Negative.  Negative for shortness of breath.   Cardiovascular: Positive for chest pain.  Gastrointestinal: Negative.  Negative for abdominal pain and nausea.  Musculoskeletal: Negative.   Skin: Negative.   Neurological: Negative.     Physical Exam Updated Vital Signs BP 119/76 (BP Location: Right Arm)   Pulse 64   Temp 98.1 F (36.7 C) (Oral)   Resp 16   SpO2 98%   Physical Exam Vitals and nursing note reviewed.  Constitutional:      Appearance: He is well-developed.  HENT:     Head: Normocephalic.  Cardiovascular:     Rate and Rhythm: Normal rate and regular rhythm.     Heart sounds: No murmur heard.   Pulmonary:     Effort: Pulmonary effort is normal.     Breath sounds: Normal breath sounds. No wheezing, rhonchi or rales.  Chest:     Chest wall: No tenderness.  Abdominal:     General: Bowel sounds are normal.     Palpations: Abdomen is soft.     Tenderness: There is no abdominal tenderness. There is no guarding or rebound.  Musculoskeletal:        General: Normal range of motion.     Cervical back: Normal range of motion and neck supple.     Right lower leg: No edema.     Left lower leg: No edema.  Skin:    General: Skin is warm and dry.     Findings: No rash.  Neurological:     Mental Status: He is alert and oriented to  person, place, and time.     ED Results / Procedures / Treatments   Labs (all labs ordered are listed, but only abnormal results are displayed) Labs Reviewed  BASIC METABOLIC PANEL - Abnormal; Notable for the following components:  Result Value   CO2 21 (*)    Glucose, Bld 171 (*)    BUN 25 (*)    Creatinine, Ser 1.35 (*)    All other components within normal limits  CBC  TROPONIN I (HIGH SENSITIVITY)  TROPONIN I (HIGH SENSITIVITY)   Results for orders placed or performed during the hospital encounter of 40/98/11  Basic metabolic panel  Result Value Ref Range   Sodium 137 135 - 145 mmol/L   Potassium 3.9 3.5 - 5.1 mmol/L   Chloride 103 98 - 111 mmol/L   CO2 21 (L) 22 - 32 mmol/L   Glucose, Bld 171 (H) 70 - 99 mg/dL   BUN 25 (H) 6 - 20 mg/dL   Creatinine, Ser 1.35 (H) 0.61 - 1.24 mg/dL   Calcium 9.5 8.9 - 10.3 mg/dL   GFR calc non Af Amer >60 >60 mL/min   GFR calc Af Amer >60 >60 mL/min   Anion gap 13 5 - 15  CBC  Result Value Ref Range   WBC 4.5 4.0 - 10.5 K/uL   RBC 5.15 4.22 - 5.81 MIL/uL   Hemoglobin 15.2 13.0 - 17.0 g/dL   HCT 44.2 39 - 52 %   MCV 85.8 80.0 - 100.0 fL   MCH 29.5 26.0 - 34.0 pg   MCHC 34.4 30.0 - 36.0 g/dL   RDW 12.2 11.5 - 15.5 %   Platelets 180 150 - 400 K/uL   nRBC 0.0 0.0 - 0.2 %  Troponin I (High Sensitivity)  Result Value Ref Range   Troponin I (High Sensitivity) 13 <18 ng/L  Troponin I (High Sensitivity)  Result Value Ref Range   Troponin I (High Sensitivity) 13 <18 ng/L    EKG EKG Interpretation  Date/Time:  Wednesday August 07 2020 12:29:06 EDT Ventricular Rate:  75 PR Interval:  184 QRS Duration: 80 QT Interval:  378 QTC Calculation: 422 R Axis:   41 Text Interpretation: Normal sinus rhythm Possible Anterior infarct , age undetermined Abnormal ECG No previous tracing Confirmed by Quintella Reichert [phone]) on 08/07/2020 8:33:09 PM   Radiology DG Chest 2 View  Result Date: 08/07/2020 CLINICAL DATA:  Chest pain.  Additional provided: Chest pain that runs down both arms. EXAM: CHEST - 2 VIEW COMPARISON:  Cardiac CT 12/14/2018. FINDINGS: Heart size within normal limits. No appreciable airspace consolidation or pulmonary edema. No evidence of pleural effusion or pneumothorax. Redemonstrated calcified granuloma within the right lower lobe. No acute bony abnormality is identified. IMPRESSION: No evidence of active cardiopulmonary disease. Electronically Signed   By: Kellie Simmering DO   On: 08/07/2020 12:55    Procedures Procedures (including critical care time)  Medications Ordered in ED Medications  alum & mag hydroxide-simeth (MAALOX/MYLANTA) 200-200-20 MG/5ML suspension 30 mL (30 mLs Oral Given 08/07/20 2339)    And  lidocaine (XYLOCAINE) 2 % viscous mouth solution 15 mL (15 mLs Oral Given 08/07/20 2339)    ED Course  I have reviewed the triage vital signs and the nursing notes.  Pertinent labs & imaging results that were available during my care of the patient were reviewed by me and considered in my medical decision making (see chart for details).    MDM Rules/Calculators/A&P                          Patient to ED for evaluation of chest pain as fully described in the HPI. No fever, cough, congestion.   He reports minimal pain  currently. NSR EKG, clear CXR, negative troponin x 2. GI cocktail provided with some improvement in discomfort.   Do not feel ss/sxs represent ACS. He does have significant risk factors, h/o nonocclusive LAD lesion (<50%) in 2017. Will have him f/u with cardiology. He is comfortable with discharge home.  Final Clinical Impression(s) / ED Diagnoses Final diagnoses:  None   1. Nonspecific chest pain  Rx / DC Orders ED Discharge Orders    None       Charlann Lange, PA-C 08/08/20 0257    Merryl Hacker, MD 08/08/20 [phone]

## 2020-08-08 NOTE — Telephone Encounter (Signed)
Patient was evaluated at the ED last night. Patient's labs and chest xray was fine. Patient stated his EKG showed an old heart attack.  Patient stated he has been having heavy pressure in chest and chest pain that radiates down his arms that comes and goes. Patient stated he has to sit up at night to sleep. Discussed with DOD, Dr. Radford Pax, ordered patient nitro to take prn for chest pain and put him on the DOD schedule for Monday (only spot available). Since Dr. Irish Lack is DOD tomorrow will send him a message to see if he wants to try to see patient tomorrow. Patient agreed with plan.

## 2020-08-08 NOTE — Discharge Instructions (Addendum)
Continue your regular medications as prescribed. Call your cardiologist tomorrow to schedule an appointment for office follow up.   Return to the ED with any new or worsening symptoms.

## 2020-08-08 NOTE — Telephone Encounter (Signed)
New Message   Received mychart patient that advised the following: Let Dr. Clayton Bibles know that I'm once again having the crushing/burning sensation in the middle of my chest, with the burning running down both arms to my palms. I reported this in 2019 with no real cause found even after scans, EKG, etc. But a new symptom is that I can only sleep sitting up...if I try to sleep on my stomach, back, or left side, the crushing/burning intensifies and I have to get up. I went to the ER yesterday (results in [place]) but they didn't identify anything.  Please contact patient.

## 2020-08-09 ENCOUNTER — Telehealth: Payer: Self-pay | Admitting: Family Medicine

## 2020-08-09 ENCOUNTER — Encounter: Payer: Self-pay | Admitting: Interventional Cardiology

## 2020-08-09 ENCOUNTER — Telehealth (INDEPENDENT_AMBULATORY_CARE_PROVIDER_SITE_OTHER): Payer: No Typology Code available for payment source | Admitting: Interventional Cardiology

## 2020-08-09 VITALS — Ht 70.0 in | Wt 265.0 lb

## 2020-08-09 DIAGNOSIS — E7800 Pure hypercholesterolemia, unspecified: Secondary | ICD-10-CM

## 2020-08-09 DIAGNOSIS — I10 Essential (primary) hypertension: Secondary | ICD-10-CM

## 2020-08-09 DIAGNOSIS — I25119 Atherosclerotic heart disease of native coronary artery with unspecified angina pectoris: Secondary | ICD-10-CM

## 2020-08-09 NOTE — Telephone Encounter (Signed)
Pt needs meds refill. Pt is out of these medications.  Medications: Ativan Lipitor  Pharmacy:  [id number] [place] Phone:  [phone] Fax:  [phone]

## 2020-08-09 NOTE — Telephone Encounter (Signed)
Called and spoke to patient. Patient has been scheduled for a virtual visit with Dr. Irish Lack today at 12:00 PM.    Patient Consent for Virtual Visit         Hector [name] has provided verbal consent on 08/09/2020 for a virtual visit (video or telephone).   CONSENT FOR VIRTUAL VISIT FOR:  Hector [name]  By participating in this virtual visit I agree to the following:  I hereby voluntarily request, consent and authorize [facility] and its employed or contracted physicians, physician assistants, nurse practitioners or other licensed health care professionals (the Practitioner), to provide me with telemedicine health care services (the "Services") as deemed necessary by the treating Practitioner. I acknowledge and consent to receive the Services by the Practitioner via telemedicine. I understand that the telemedicine visit will involve communicating with the Practitioner through live audiovisual communication technology and the disclosure of certain medical information by electronic transmission. I acknowledge that I have been given the opportunity to request an in-person assessment or other available alternative prior to the telemedicine visit and am voluntarily participating in the telemedicine visit.  I understand that I have the right to withhold or withdraw my consent to the use of telemedicine in the course of my care at any time, without affecting my right to future care or treatment, and that the Practitioner or I may terminate the telemedicine visit at any time. I understand that I have the right to inspect all information obtained and/or recorded in the course of the telemedicine visit and may receive copies of available information for a reasonable fee.  I understand that some of the potential risks of receiving the Services via telemedicine include:  Marland Kitchen Delay or interruption in medical evaluation due to technological equipment failure or disruption; . Information transmitted may not be  sufficient (e.g. poor resolution of images) to allow for appropriate medical decision making by the Practitioner; and/or  . In rare instances, security protocols could fail, causing a breach of personal health information.  Furthermore, I acknowledge that it is my responsibility to provide information about my medical history, conditions and care that is complete and accurate to the best of my ability. I acknowledge that Practitioner's advice, recommendations, and/or decision may be based on factors not within their control, such as incomplete or inaccurate data provided by me or distortions of diagnostic images or specimens that may result from electronic transmissions. I understand that the practice of medicine is not an exact science and that Practitioner makes no warranties or guarantees regarding treatment outcomes. I acknowledge that a copy of this consent can be made available to me via my patient portal ([facility]), or I can request a printed copy by calling the office of [facility].    I understand that my insurance will be billed for this visit.   I have read or had this consent read to me. . I understand the contents of this consent, which adequately explains the benefits and risks of the Services being provided via telemedicine.  . I have been provided ample opportunity to ask questions regarding this consent and the Services and have had my questions answered to my satisfaction. . I give my informed consent for the services to be provided through the use of telemedicine in my medical care

## 2020-08-09 NOTE — Progress Notes (Signed)
Virtual Visit via Video Note   This visit type was conducted due to national recommendations for restrictions regarding the COVID-19 Pandemic (e.g. social distancing) in an effort to limit this patient's exposure and mitigate transmission in our community.  Due to his co-morbid illnesses, this patient is at least at moderate risk for complications without adequate follow up.  This format is felt to be most appropriate for this patient at this time.  All issues noted in this document were discussed and addressed.  A limited physical exam was performed with this format.  Please refer to the patient's chart for his consent to telehealth for [facility].       Date:  08/09/2020   ID:  Hector [name], DOB 05/22/1971, MRN [id number] The patient was identified using 2 identifiers.  Patient Location: Home Provider Location: Office/Clinic  PCP:  [name], MD  Cardiologist:  Larae Grooms, MD  Electrophysiologist:  None   Evaluation Performed:  Follow-Up Visit  Chief Complaint:  Chest pain, CAD  History of Present Illness:    Hector [name] is a 49 y.o. male with  Who I saw in 2017 for chest pain while he was hospitalized.  He had a stress echo at that time: - Stress ECG conclusions: There were no stress arrhythmias or conduction abnormalities. The stress ECG was negative for ischemia. - Staged echo: There was no echocardiographic evidence for stress-induced ischemia. - Impressions: Normal stress echo   Seen in 09/2018 for chest tightness while watching TV and eased with deep breathing. He was very sedentary. 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  Plan in 2020 was: " RF modification has been the plan.  He does have some DOE.  We discussed heart cath but he wants to defer.  If sx get worse, he will let us know. We would consider heart cath.   "  The patient does not have symptoms concerning for COVID-19 infection (fever, chills, cough, or new shortness of breath).   He has had some chest pain that brought him to the ER. REcords reviewed: "with history of HTN, HLD, morbid obesity presents for evaluation of intermittent central chest pain x 3-4 days that is worse at night with certain lying positions. It improves when he raises to an incline. No SOB, nausea, vomiting. He denies significant history of heartburn or stomach issues. Yesterday he had an episode of pain while walking around a retail store and getting frustrated with his daughter. "  Worse with emotional stress, and with carrying groceries a few days ago.  He needed to sit down.  He used a few NTG and with rest, sx resolved.   Feels like a "crushing, burning pain radiating to the arms."  Heating pad has helped also.  Worse with lying down.  He was sent home from ER.   He does smoke MJ, daily typically.  All of his chest Sx started after inhaling an ember.  He coughed very powerfully and he could see sparkles everywhere.  He thought he tore something.   Overall, feels that things are getting better.      Past Medical History:  Diagnosis Date  . Alcohol abuse   . Allergy   . Anal fissure   . Anxiety   . Asthma   . Back pain   . Bilateral swelling of feet   . Blood in stool   .  Chest pain   . Depression   . Drug use   . Febrile seizure ([place])   . Heartburn   . Hyperlipidemia   . Hypertension   . Irritable bowel syndrome with diarrhea   . Jaundice, newborn   . Palpitations   . Panic   . Panic disorder   . Pre-diabetes   . Seizures (HCC)    febrile  . Shortness of breath   . Sleep apnea    Past Surgical History:  Procedure Laterality Date  . HEMORRHOID BANDING    . WISDOM TOOTH EXTRACTION  1991     Current Meds  Medication Sig  . FLUoxetine (PROZAC) 10 MG capsule TAKE 1 CAPSULE DAILY  . LORazepam (ATIVAN) 1 MG tablet Take 1 tablet (1 mg total) by  mouth every 8 (eight) hours as needed for anxiety.  Marland Kitchen losartan (COZAAR) 50 MG tablet Take 1 tablet (50 mg total) by mouth daily.  . nebivolol (BYSTOLIC) 10 MG tablet TAKE 1 TABLET BY MOUTH ONCE DAILY. NEED APPT FOR REFILLS  . nitroGLYCERIN (NITROSTAT) 0.4 MG SL tablet Place 1 tablet (0.4 mg total) under the tongue every 5 (five) minutes as needed for chest pain.  . Vitamin D, Ergocalciferol, (DRISDOL) 1.25 MG (50000 UNIT) CAPS capsule Take 1 capsule (50,000 Units total) by mouth every 7 (seven) days.     Allergies:   Patient has no known allergies.   Social History   Tobacco Use  . Smoking status: Never Smoker  . Smokeless tobacco: Never Used  Vaping Use  . Vaping Use: Former  Substance Use Topics  . Alcohol use: Yes    Alcohol/week: 0.0 standard drinks    Comment: 1-5 per week  . Drug use: Yes    Types: Marijuana     Family Hx: The patient's family history includes Basal cell carcinoma in his mother; Cancer in his mother; Colitis in his brother; Colon polyps in his brother and father; Dementia in his mother; Depression in his father and mother; Diabetes in his father; Heart attack in his father; Heart disease (age of onset: 4) in his father; Hyperlipidemia in his father; Hypertension in his father and mother; Obesity in his father; Skin cancer in his mother; Sleep apnea in his father; Squamous cell carcinoma in his mother; Stroke in his father and mother.  ROS:   Please see the history of present illness.    Chest discomfort as noted above All other systems reviewed and are negative.   Prior CV studies:   The following studies were reviewed today: Prior CT result noted  Labs/Other Tests and Data Reviewed:    EKG:  An ECG dated 08/07/20 was personally reviewed today and demonstrated:  NSR, PRWP, no ST changes  Recent Labs: 11/16/2019: ALT 21; TSH 2.410 08/07/2020: BUN 25; Creatinine, Ser 1.35; Hemoglobin 15.2; Platelets 180; Potassium 3.9; Sodium 137   Recent Lipid  Panel Lab Results  Component Value Date/Time   CHOL 136 11/16/2019 10:07 AM   TRIG 105 11/16/2019 10:07 AM   HDL 35 (L) 11/16/2019 10:07 AM   CHOLHDL 3 06/12/2019 10:59 AM   LDLCALC 81 11/16/2019 10:07 AM    Wt Readings from Last 3 Encounters:  08/09/20 265 lb (120.2 kg)  07/29/20 270 lb (122.5 kg)  06/26/20 266 lb (120.7 kg)     Objective:    Vital Signs:  Ht 5' 10"  (1.778 m)   Wt 265 lb (120.2 kg)   BMI 38.02 kg/m    VITAL SIGNS:  reviewed  GEN:  no acute distress RESPIRATORY:  normal respiratory effort, symmetric expansion CARDIOVASCULAR:  no shortness of breath NEURO:  alert and oriented x 3, no obvious focal deficit PSYCH:  normal affect exam limited by video format  ASSESSMENT & PLAN:    1. Chest discomfort: Some atypical features.  He had a negative troponin in the ER.  We discussed cath.  He thinks his pain is more musculoskeletal, from significant coughing.  He will continue to monitor his symptoms.  I gave him precautions of when to go to the emergency room again.  I think cardiac cath would be the only useful test to do going forward to further evaluate his symptoms, given his elevated calcium score.  Nuclear stress test specificity will be decreased due to his obesity.  If his symptoms get worse, he will let us know and we will plan for cardiac cath.  The procedure was explained to the patient and all questions were answered. 2. Morbid obesity: Long-term, will need plan for weight loss.  Whole food, plant-based diet would be helpful. 3. Hypertension: Low-salt diet.  Continue current medications.  Continue to monitor. 4. Hyperlipidemia: Continue atorvastatin, especially given elevated calcium score.  COVID-19 Education: The signs and symptoms of COVID-19 were discussed with the patient and how to seek care for testing (follow up with PCP or arrange E-visit).  The importance of social distancing was discussed today.  Time:   Today, I have spent 20 minutes with the  patient with telehealth technology discussing the above problems.     Medication Adjustments/Labs and Tests Ordered: Current medicines are reviewed at length with the patient today.  Concerns regarding medicines are outlined above.   Tests Ordered: No orders of the defined types were placed in this encounter.   Medication Changes: No orders of the defined types were placed in this encounter.   Follow Up:  phone call next week to check sx  Signed, Larae Grooms, MD  08/09/2020 12:19 PM    [place]

## 2020-08-09 NOTE — Patient Instructions (Signed)
Medication Instructions:  Your physician recommends that you continue on your current medications as directed. Please refer to the Current Medication list given to you today.  *If you need a refill on your cardiac medications before your next appointment, please call your pharmacy*   Lab Work: None  If you have labs (blood work) drawn today and your tests are completely normal, you will receive your results only by: Marland Kitchen MyChart Message (if you have MyChart) OR . A paper copy in the mail If you have any lab test that is abnormal or we need to change your treatment, we will call you to review the results.   Testing/Procedures: None   Follow-Up: At [facility], you and your health needs are our priority.  As part of our continuing mission to provide you with exceptional heart care, we have created designated Provider Care Teams.  These Care Teams include your primary Cardiologist (physician) and Advanced Practice Providers (APPs -  Physician Assistants and Nurse Practitioners) who all work together to provide you with the care you need, when you need it.  We recommend signing up for the patient portal called "MyChart".  Sign up information is provided on this After Visit Summary.  MyChart is used to connect with patients for Virtual Visits (Telemedicine).  Patients are able to view lab/test results, encounter notes, upcoming appointments, etc.  Non-urgent messages can be sent to your provider as well.   To learn more about what you can do with MyChart, go to [email or web address].    Your next appointment:   6 month(s)  The format for your next appointment:   In Person  Provider:   You may see Larae Grooms, MD or one of the following Advanced Practice Providers on your designated Care Team:    Melina Copa, PA-C  Ermalinda Barrios, PA-C    Other Instructions Call us to report your symptoms next week

## 2020-08-09 NOTE — H&P (View-Only) (Signed)
Virtual Visit via Video Note   This visit type was conducted due to national recommendations for restrictions regarding the COVID-19 Pandemic (e.g. social distancing) in an effort to limit this patient's exposure and mitigate transmission in our community.  Due to his co-morbid illnesses, this patient is at least at moderate risk for complications without adequate follow up.  This format is felt to be most appropriate for this patient at this time.  All issues noted in this document were discussed and addressed.  A limited physical exam was performed with this format.  Please refer to the patient's chart for his consent to telehealth for [facility].       Date:  08/09/2020   ID:  Hector [name], DOB March 29, 1971, MRN [id number] The patient was identified using 2 identifiers.  Patient Location: Home Provider Location: Office/Clinic  PCP:  Hector Post, MD  Cardiologist:  Hector Grooms, MD  Electrophysiologist:  None   Evaluation Performed:  Follow-Up Visit  Chief Complaint:  Chest pain, CAD  History of Present Illness:    Hector [name] is a 49 y.o. male with  Who I saw in 2017 for chest pain while he was hospitalized.  He had a stress echo at that time: - Stress ECG conclusions: There were no stress arrhythmias or conduction abnormalities. The stress ECG was negative for ischemia. - Staged echo: There was no echocardiographic evidence for stress-induced ischemia. - Impressions: Normal stress echo   Seen in 09/2018 for chest tightness while watching TV and eased with deep breathing. He was very sedentary. 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  Plan in 2020 was: " RF modification has been the plan.  He does have some DOE.  We discussed heart cath but he wants to defer.  If sx get worse, he will let us know. We would consider heart cath.   "  The patient does not have symptoms concerning for COVID-19 infection (fever, chills, cough, or new shortness of breath).   He has had some chest pain that brought him to the ER. REcords reviewed: "with history of HTN, HLD, morbid obesity presents for evaluation of intermittent central chest pain x 3-4 days that is worse at night with certain lying positions. It improves when he raises to an incline. No SOB, nausea, vomiting. He denies significant history of heartburn or stomach issues. Yesterday he had an episode of pain while walking around a retail store and getting frustrated with his daughter. "  Worse with emotional stress, and with carrying groceries a few days ago.  He needed to sit down.  He used a few NTG and with rest, sx resolved.   Feels like a "crushing, burning pain radiating to the arms."  Heating pad has helped also.  Worse with lying down.  He was sent home from ER.   He does smoke MJ, daily typically.  All of his chest Sx started after inhaling an ember.  He coughed very powerfully and he could see sparkles everywhere.  He thought he tore something.   Overall, feels that things are getting better.      Past Medical History:  Diagnosis Date  . Alcohol abuse   . Allergy   . Anal fissure   . Anxiety   . Asthma   . Back pain   . Bilateral swelling of feet   . Blood in stool   .  Chest pain   . Depression   . Drug use   . Febrile seizure ([place])   . Heartburn   . Hyperlipidemia   . Hypertension   . Irritable bowel syndrome with diarrhea   . Jaundice, newborn   . Palpitations   . Panic   . Panic disorder   . Pre-diabetes   . Seizures ([facility])    febrile  . Shortness of breath   . Sleep apnea    Past Surgical History:  Procedure Laterality Date  . HEMORRHOID BANDING    . WISDOM TOOTH EXTRACTION  1991     Current Meds  Medication Sig  . FLUoxetine (PROZAC) 10 MG capsule TAKE 1 CAPSULE DAILY  . LORazepam (ATIVAN) 1 MG tablet Take 1 tablet (1 mg total) by  mouth every 8 (eight) hours as needed for anxiety.  Marland Kitchen losartan (COZAAR) 50 MG tablet Take 1 tablet (50 mg total) by mouth daily.  . nebivolol (BYSTOLIC) 10 MG tablet TAKE 1 TABLET BY MOUTH ONCE DAILY. NEED APPT FOR REFILLS  . nitroGLYCERIN (NITROSTAT) 0.4 MG SL tablet Place 1 tablet (0.4 mg total) under the tongue every 5 (five) minutes as needed for chest pain.  . Vitamin D, Ergocalciferol, (DRISDOL) 1.25 MG (50000 UNIT) CAPS capsule Take 1 capsule (50,000 Units total) by mouth every 7 (seven) days.     Allergies:   Patient has no known allergies.   Social History   Tobacco Use  . Smoking status: Never Smoker  . Smokeless tobacco: Never Used  Vaping Use  . Vaping Use: Former  Substance Use Topics  . Alcohol use: Yes    Alcohol/week: 0.0 standard drinks    Comment: 1-5 per week  . Drug use: Yes    Types: Marijuana     Family Hx: The patient's family history includes Basal cell carcinoma in his mother; Cancer in his mother; Colitis in his brother; Colon polyps in his brother and father; Dementia in his mother; Depression in his father and mother; Diabetes in his father; Heart attack in his father; Heart disease (age of onset: 73) in his father; Hyperlipidemia in his father; Hypertension in his father and mother; Obesity in his father; Skin cancer in his mother; Sleep apnea in his father; Squamous cell carcinoma in his mother; Stroke in his father and mother.  ROS:   Please see the history of present illness.    Chest discomfort as noted above All other systems reviewed and are negative.   Prior CV studies:   The following studies were reviewed today: Prior CT result noted  Labs/Other Tests and Data Reviewed:    EKG:  An ECG dated 08/07/20 was personally reviewed today and demonstrated:  NSR, PRWP, no ST changes  Recent Labs: 11/16/2019: ALT 21; TSH 2.410 08/07/2020: BUN 25; Creatinine, Ser 1.35; Hemoglobin 15.2; Platelets 180; Potassium 3.9; Sodium 137   Recent Lipid  Panel Lab Results  Component Value Date/Time   CHOL 136 11/16/2019 10:07 AM   TRIG 105 11/16/2019 10:07 AM   HDL 35 (L) 11/16/2019 10:07 AM   CHOLHDL 3 06/12/2019 10:59 AM   LDLCALC 81 11/16/2019 10:07 AM    Wt Readings from Last 3 Encounters:  08/09/20 265 lb (120.2 kg)  07/29/20 270 lb (122.5 kg)  06/26/20 266 lb (120.7 kg)     Objective:    Vital Signs:  Ht 5' 10"  (1.778 m)   Wt 265 lb (120.2 kg)   BMI 38.02 kg/m    VITAL SIGNS:  reviewed  GEN:  no acute distress RESPIRATORY:  normal respiratory effort, symmetric expansion CARDIOVASCULAR:  no shortness of breath NEURO:  alert and oriented x 3, no obvious focal deficit PSYCH:  normal affect exam limited by video format  ASSESSMENT & PLAN:    1. Chest discomfort: Some atypical features.  He had a negative troponin in the ER.  We discussed cath.  He thinks his pain is more musculoskeletal, from significant coughing.  He will continue to monitor his symptoms.  I gave him precautions of when to go to the emergency room again.  I think cardiac cath would be the only useful test to do going forward to further evaluate his symptoms, given his elevated calcium score.  Nuclear stress test specificity will be decreased due to his obesity.  If his symptoms get worse, he will let us know and we will plan for cardiac cath.  The procedure was explained to the patient and all questions were answered. 2. Morbid obesity: Long-term, will need plan for weight loss.  Whole food, plant-based diet would be helpful. 3. Hypertension: Low-salt diet.  Continue current medications.  Continue to monitor. 4. Hyperlipidemia: Continue atorvastatin, especially given elevated calcium score.  COVID-19 Education: The signs and symptoms of COVID-19 were discussed with the patient and how to seek care for testing (follow up with PCP or arrange E-visit).  The importance of social distancing was discussed today.  Time:   Today, I have spent 20 minutes with the  patient with telehealth technology discussing the above problems.     Medication Adjustments/Labs and Tests Ordered: Current medicines are reviewed at length with the patient today.  Concerns regarding medicines are outlined above.   Tests Ordered: No orders of the defined types were placed in this encounter.   Medication Changes: No orders of the defined types were placed in this encounter.   Follow Up:  phone call next week to check sx  Signed, Hector Grooms, MD  08/09/2020 12:19 PM    [place]

## 2020-08-12 ENCOUNTER — Ambulatory Visit: Payer: No Typology Code available for payment source | Admitting: Cardiovascular Disease

## 2020-08-12 ENCOUNTER — Telehealth: Payer: Self-pay | Admitting: Interventional Cardiology

## 2020-08-12 NOTE — Telephone Encounter (Signed)
Follow up:    Patient returning your call.

## 2020-08-12 NOTE — Telephone Encounter (Signed)
Spoke with the patient who states that he has continued to have episodes of chest discomfort. He describes it as a tightness and burning sensation. He states that he has been trying to figure out any triggers for it but has not been able to associate anything with the discomfort. He states that it has happened when he has been lying down and when he walks up stairs. He states that pain has been relieved with nitroglycerin. He denies any associated symptoms of N/V, SOB, diaphoresis, or dizziness. Patient had a virtual visit with Dr. Irish Lack on Friday and possibility of heart catheterization was discussed. Patient is thinking about going through with cath now. Patient aware to call 911 if chest pain is not relieved.

## 2020-08-12 NOTE — Telephone Encounter (Signed)
Patient was initially scheduled for an in person visit with Dr. Angelena Form today (08/12/20). His appointment was cancelled after he was scheduled for a virtual visit with Dr. Irish Lack on Friday.  The patient had more symptoms over the weekend and was hoping to be seen in person soon. Please let the patient know if he can be seen in person sooner

## 2020-08-12 NOTE — Telephone Encounter (Signed)
Left message for patient to call back  

## 2020-08-12 NOTE — Telephone Encounter (Signed)
OK to schedule cath for next week if he is still having sx.  Avoid strenuous activity until cath.  JV

## 2020-08-13 ENCOUNTER — Encounter: Payer: Self-pay | Admitting: Family Medicine

## 2020-08-13 MED ORDER — LORAZEPAM 1 MG PO TABS: 1.0000 mg | ORAL_TABLET | Freq: Three times a day (TID) | ORAL | 0 refills | Status: DC | PRN

## 2020-08-13 NOTE — Telephone Encounter (Signed)
Called and spoke to the patient. Made him aware that GI will likely want want his cardiac eval to be complete prior to performing an endoscopy. Made patient aware that Dr. Irish Lack was okay with ordering a Cardiac CT however, this can take several weeks to schedule. Given that the patient's Sx have persisted he agrees to have LHC. Patient has been scheduled for 8/23 with Dr. Irish Lack and will have his Covid test done on 8/21. Labs and EKG are on file. Post Cath f/u appointment schedule with Ermalinda Barrios, on PA 9/8. Reviewed instructions with the patient and sent instructions through [place].

## 2020-08-13 NOTE — Addendum Note (Signed)
Addended by: [name] on: 08/13/2020 12:40 PM   Modules accepted: Orders

## 2020-08-13 NOTE — Telephone Encounter (Signed)
Dr. Hassell Done response to patient's MyChart message:  Hector Booze, MD  You; Thompson Grayer, RN 1 hour ago (12:01 PM)   I think GI would want cardiac eval to be completed before doing endscopy.    I am ok to order coronary CT but since it may be a few weeks before this can be done, so if sx persist, a cath can be done sooner than a CT.   JV

## 2020-08-13 NOTE — Telephone Encounter (Signed)
Spoke with patient and he is going to have a Heart Cath. See telephone encounter for complete details.

## 2020-08-13 NOTE — Telephone Encounter (Signed)
Okay for refill. Last OV was 02/2020. In regards to Lipitor it was  rx'd by a different provider and on 08/09/2020 he reported that he was not taking medication

## 2020-08-15 ENCOUNTER — Telehealth: Payer: Self-pay | Admitting: *Deleted

## 2020-08-15 NOTE — Telephone Encounter (Signed)
Pt contacted pre-catheterization scheduled at [facility] for: Monday August 19, 2020 7:30 AM Verified arrival time and place: [facility]) at: 5:30 AM   No solid food after midnight prior to cath, clear liquids until 5 AM day of procedure.   AM meds can be  taken pre-cath with sips of water including: ASA 81 mg   Confirmed patient has responsible adult to drive home post procedure and observe 24 hours after arriving home: yes  You are allowed ONE visitor in the waiting room during your procedure. Both you and your visitor must wear a mask once you enter the hospital.       COVID-19 Pre-Screening Questions:  . In the past 14 days have you had a new cough, new shortness of breath, headache, congestion, fever (100 or greater) unexplained body aches, new sore throat, or sudden loss of taste or sense of smell? no . In the past 14 days have you been around anyone with known Covid 19? no . Have you been vaccinated for COVID-19? Yes, see immunization history  Reviewed procedure/mask/visitor instructions, COVID-19 questions with patient.

## 2020-08-17 ENCOUNTER — Other Ambulatory Visit (HOSPITAL_COMMUNITY)
Admission: RE | Admit: 2020-08-17 | Discharge: 2020-08-17 | Disposition: A | Discharge status: Home / Self Care | Payer: No Typology Code available for payment source | Source: Ambulatory Visit | Attending: Interventional Cardiology | Admitting: Interventional Cardiology

## 2020-08-17 DIAGNOSIS — Z01812 Encounter for preprocedural laboratory examination: Secondary | ICD-10-CM | POA: Insufficient documentation

## 2020-08-17 DIAGNOSIS — Z20822 Contact with and (suspected) exposure to covid-19: Secondary | ICD-10-CM | POA: Diagnosis not present

## 2020-08-17 LAB — SARS CORONAVIRUS 2 (TAT 6-24 HRS): SARS Coronavirus 2: NEGATIVE

## 2020-08-19 ENCOUNTER — Ambulatory Visit (HOSPITAL_COMMUNITY)
Admission: RE | Admit: 2020-08-19 | Discharge: 2020-08-19 | Disposition: A | Discharge status: Home / Self Care | Payer: No Typology Code available for payment source | Attending: Interventional Cardiology | Admitting: Interventional Cardiology

## 2020-08-19 ENCOUNTER — Other Ambulatory Visit: Payer: Self-pay

## 2020-08-19 ENCOUNTER — Ambulatory Visit (HOSPITAL_COMMUNITY)
Admission: RE | Disposition: A | Discharge status: Home / Self Care | Payer: Self-pay | Source: Home / Self Care | Attending: Interventional Cardiology

## 2020-08-19 DIAGNOSIS — Z6839 Body mass index (BMI) 39.0-39.9, adult: Secondary | ICD-10-CM | POA: Diagnosis not present

## 2020-08-19 DIAGNOSIS — Z95820 Peripheral vascular angioplasty status with implants and grafts: Secondary | ICD-10-CM

## 2020-08-19 DIAGNOSIS — G4730 Sleep apnea, unspecified: Secondary | ICD-10-CM | POA: Diagnosis not present

## 2020-08-19 DIAGNOSIS — Z79899 Other long term (current) drug therapy: Secondary | ICD-10-CM | POA: Diagnosis not present

## 2020-08-19 DIAGNOSIS — I2510 Atherosclerotic heart disease of native coronary artery without angina pectoris: Secondary | ICD-10-CM

## 2020-08-19 DIAGNOSIS — E785 Hyperlipidemia, unspecified: Secondary | ICD-10-CM | POA: Diagnosis not present

## 2020-08-19 DIAGNOSIS — E7800 Pure hypercholesterolemia, unspecified: Secondary | ICD-10-CM | POA: Diagnosis present

## 2020-08-19 DIAGNOSIS — R7303 Prediabetes: Secondary | ICD-10-CM | POA: Insufficient documentation

## 2020-08-19 DIAGNOSIS — I10 Essential (primary) hypertension: Secondary | ICD-10-CM | POA: Diagnosis not present

## 2020-08-19 DIAGNOSIS — F1290 Cannabis use, unspecified, uncomplicated: Secondary | ICD-10-CM | POA: Diagnosis not present

## 2020-08-19 DIAGNOSIS — I25110 Atherosclerotic heart disease of native coronary artery with unstable angina pectoris: Secondary | ICD-10-CM | POA: Diagnosis not present

## 2020-08-19 DIAGNOSIS — R079 Chest pain, unspecified: Secondary | ICD-10-CM | POA: Diagnosis present

## 2020-08-19 DIAGNOSIS — J45909 Unspecified asthma, uncomplicated: Secondary | ICD-10-CM | POA: Insufficient documentation

## 2020-08-19 DIAGNOSIS — F329 Major depressive disorder, single episode, unspecified: Secondary | ICD-10-CM | POA: Insufficient documentation

## 2020-08-19 DIAGNOSIS — F419 Anxiety disorder, unspecified: Secondary | ICD-10-CM | POA: Insufficient documentation

## 2020-08-19 HISTORY — PX: INTRAVASCULAR ULTRASOUND/IVUS: CATH118244

## 2020-08-19 HISTORY — PX: LEFT HEART CATH AND CORONARY ANGIOGRAPHY: CATH118249

## 2020-08-19 HISTORY — PX: CORONARY STENT INTERVENTION: CATH118234

## 2020-08-19 LAB — POCT ACTIVATED CLOTTING TIME
Activated Clotting Time: 351 s
Activated Clotting Time: 466 seconds

## 2020-08-19 SURGERY — LEFT HEART CATH AND CORONARY ANGIOGRAPHY
Anesthesia: LOCAL

## 2020-08-19 MED ORDER — HEPARIN (PORCINE) IN NACL 1000-0.9 UT/500ML-% IV SOLN
INTRAVENOUS | Status: DC | PRN
  Administered 2020-08-19 (×2): 500 mL

## 2020-08-19 MED ORDER — SODIUM CHLORIDE 0.9% FLUSH: 3.0000 mL | Freq: Two times a day (BID) | INTRAVENOUS | Status: DC

## 2020-08-19 MED ORDER — NEBIVOLOL HCL 10 MG PO TABS: ORAL_TABLET | ORAL | 3 refills | Status: DC

## 2020-08-19 MED ORDER — MIDAZOLAM HCL 2 MG/2ML IJ SOLN
INTRAMUSCULAR | Status: AC
  Filled 2020-08-19: qty 2

## 2020-08-19 MED ORDER — ACETAMINOPHEN 325 MG PO TABS: 650.0000 mg | ORAL_TABLET | ORAL | Status: DC | PRN

## 2020-08-19 MED ORDER — TICAGRELOR 90 MG PO TABS
ORAL_TABLET | ORAL | Status: DC | PRN
  Administered 2020-08-19: 180 mg via ORAL

## 2020-08-19 MED ORDER — SODIUM CHLORIDE 0.9% FLUSH: 3.0000 mL | INTRAVENOUS | Status: DC | PRN

## 2020-08-19 MED ORDER — LIDOCAINE HCL (PF) 1 % IJ SOLN
INTRAMUSCULAR | Status: DC | PRN
  Administered 2020-08-19: 2 mL

## 2020-08-19 MED ORDER — TICAGRELOR 90 MG PO TABS: 90.0000 mg | ORAL_TABLET | Freq: Two times a day (BID) | ORAL | Status: DC

## 2020-08-19 MED ORDER — HEPARIN (PORCINE) IN NACL 1000-0.9 UT/500ML-% IV SOLN
INTRAVENOUS | Status: AC
  Filled 2020-08-19: qty 500

## 2020-08-19 MED ORDER — MIDAZOLAM HCL 2 MG/2ML IJ SOLN
INTRAMUSCULAR | Status: DC | PRN
  Administered 2020-08-19: 2 mg via INTRAVENOUS
  Administered 2020-08-19 (×3): 1 mg via INTRAVENOUS

## 2020-08-19 MED ORDER — IOHEXOL 350 MG/ML SOLN
INTRAVENOUS | Status: DC | PRN
  Administered 2020-08-19: 130 mL via INTRA_ARTERIAL

## 2020-08-19 MED ORDER — TICAGRELOR 90 MG PO TABS
ORAL_TABLET | ORAL | Status: AC
  Filled 2020-08-19: qty 1

## 2020-08-19 MED ORDER — TICAGRELOR 90 MG PO TABS: 90.0000 mg | ORAL_TABLET | Freq: Two times a day (BID) | ORAL | 3 refills | Status: DC

## 2020-08-19 MED ORDER — TIROFIBAN HCL IN NACL 5-0.9 MG/100ML-% IV SOLN
INTRAVENOUS | Status: AC
  Filled 2020-08-19: qty 100

## 2020-08-19 MED ORDER — ASPIRIN EC 81 MG PO TBEC: 81.0000 mg | DELAYED_RELEASE_TABLET | Freq: Every day | ORAL | 2 refills | Status: AC

## 2020-08-19 MED ORDER — VERAPAMIL HCL 2.5 MG/ML IV SOLN
INTRAVENOUS | Status: AC
  Filled 2020-08-19: qty 2

## 2020-08-19 MED ORDER — FENTANYL CITRATE (PF) 100 MCG/2ML IJ SOLN
INTRAMUSCULAR | Status: DC | PRN
  Administered 2020-08-19 (×4): 25 ug via INTRAVENOUS

## 2020-08-19 MED ORDER — SODIUM CHLORIDE 0.9 % WEIGHT BASED INFUSION
3.0000 mL/kg/h | INTRAVENOUS | Status: DC
  Administered 2020-08-19: 3 mL/kg/h via INTRAVENOUS

## 2020-08-19 MED ORDER — ONDANSETRON HCL 4 MG/2ML IJ SOLN: 4.0000 mg | Freq: Four times a day (QID) | INTRAMUSCULAR | Status: DC | PRN

## 2020-08-19 MED ORDER — HEPARIN SODIUM (PORCINE) 1000 UNIT/ML IJ SOLN
INTRAMUSCULAR | Status: DC | PRN
  Administered 2020-08-19 (×2): 6000 [IU] via INTRAVENOUS

## 2020-08-19 MED ORDER — VERAPAMIL HCL 2.5 MG/ML IV SOLN
INTRAVENOUS | Status: DC | PRN
  Administered 2020-08-19: 10 mL via INTRA_ARTERIAL

## 2020-08-19 MED ORDER — SODIUM CHLORIDE 0.9 % IV SOLN: 250.0000 mL | INTRAVENOUS | Status: DC | PRN

## 2020-08-19 MED ORDER — HEPARIN SODIUM (PORCINE) 1000 UNIT/ML IJ SOLN
INTRAMUSCULAR | Status: AC
  Filled 2020-08-19: qty 1

## 2020-08-19 MED ORDER — ASPIRIN 81 MG PO CHEW: 81.0000 mg | CHEWABLE_TABLET | ORAL | Status: DC

## 2020-08-19 MED ORDER — FENTANYL CITRATE (PF) 100 MCG/2ML IJ SOLN
INTRAMUSCULAR | Status: AC
  Filled 2020-08-19: qty 2

## 2020-08-19 MED ORDER — LABETALOL HCL 5 MG/ML IV SOLN: 10.0000 mg | INTRAVENOUS | Status: AC | PRN

## 2020-08-19 MED ORDER — HYDRALAZINE HCL 20 MG/ML IJ SOLN: 10.0000 mg | INTRAMUSCULAR | Status: AC | PRN

## 2020-08-19 MED ORDER — ATORVASTATIN CALCIUM 80 MG PO TABS: 80.0000 mg | ORAL_TABLET | Freq: Every day | ORAL | 3 refills | Status: DC

## 2020-08-19 MED ORDER — NITROGLYCERIN 1 MG/10 ML FOR IR/CATH LAB
INTRA_ARTERIAL | Status: DC | PRN
  Administered 2020-08-19 (×2): 200 ug via INTRACORONARY

## 2020-08-19 MED ORDER — SODIUM CHLORIDE 0.9 % IV SOLN: INTRAVENOUS | Status: AC

## 2020-08-19 MED ORDER — TIROFIBAN (AGGRASTAT) BOLUS VIA INFUSION
INTRAVENOUS | Status: DC | PRN
  Administered 2020-08-19: 3005 ug via INTRAVENOUS

## 2020-08-19 MED ORDER — SODIUM CHLORIDE 0.9 % WEIGHT BASED INFUSION: 1.0000 mL/kg/h | INTRAVENOUS | Status: DC

## 2020-08-19 MED ORDER — LIDOCAINE HCL (PF) 1 % IJ SOLN
INTRAMUSCULAR | Status: AC
  Filled 2020-08-19: qty 30

## 2020-08-19 MED ORDER — ASPIRIN 81 MG PO CHEW: 81.0000 mg | CHEWABLE_TABLET | Freq: Every day | ORAL | Status: DC

## 2020-08-19 MED ORDER — LOSARTAN POTASSIUM 50 MG PO TABS
50.0000 mg | ORAL_TABLET | Freq: Every day | ORAL | 3 refills | Status: DC
Start: 2020-08-19 — End: 2021-03-10

## 2020-08-19 MED FILL — ATORVASTATIN CALCIUM 80 MG: 80 | 30 days supply | Qty: 30 | Fill #0

## 2020-08-19 MED FILL — ASPIRIN LOW DOSE 81 MG TBEC: 81 | 90 days supply | Qty: 90 | Fill #0

## 2020-08-19 MED FILL — BRILINTA 90 MG TABLET: 90 | 30 days supply | Qty: 60 | Fill #0

## 2020-08-19 SURGICAL SUPPLY — 21 items
BALLN SAPPHIRE 2.0X15 (BALLOONS) ×2
BALLN SAPPHIRE ~~LOC~~ 4.0X15 (BALLOONS) ×2 IMPLANT
BALLOON SAPPHIRE 2.0X15 (BALLOONS) ×1 IMPLANT
CATH 5FR JL3.5 JR4 ANG PIG MP (CATHETERS) ×2 IMPLANT
CATH LAUNCHER 6FR EBU3.5 (CATHETERS) ×2 IMPLANT
CATH OPTICROSS HD (CATHETERS) ×2 IMPLANT
DEVICE RAD COMP TR BAND LRG (VASCULAR PRODUCTS) ×2 IMPLANT
GLIDESHEATH SLEND SS 6F .021 (SHEATH) ×2 IMPLANT
GUIDEWIRE INQWIRE 1.5J.035X260 (WIRE) ×1 IMPLANT
INQWIRE 1.5J .035X260CM (WIRE) ×2
KIT ENCORE 26 ADVANTAGE (KITS) ×2 IMPLANT
KIT HEART LEFT (KITS) ×2 IMPLANT
KIT HEMO VALVE WATCHDOG (MISCELLANEOUS) ×2 IMPLANT
PACK CARDIAC CATHETERIZATION (CUSTOM PROCEDURE TRAY) ×2 IMPLANT
SHEATH PROBE COVER 6X72 (BAG) ×2 IMPLANT
SLED PULL BACK IVUS (MISCELLANEOUS) ×2 IMPLANT
STENT RESOLUTE ONYX 3.0X30 (Permanent Stent) ×2 IMPLANT
TRANSDUCER W/STOPCOCK (MISCELLANEOUS) ×2 IMPLANT
TUBING CIL FLEX 10 FLL-RA (TUBING) ×2 IMPLANT
WIRE ASAHI PROWATER 180CM (WIRE) ×2 IMPLANT
WIRE HI TORQ BMW 190CM (WIRE) ×2 IMPLANT

## 2020-08-19 NOTE — Progress Notes (Signed)
[id number] Discussed with pt the importance of brilinta with stent. Reviewed NTG use, walking for ex, gave heart healthy diet, discussed CRP 2 and risk factors. Pt stated he needed new prescriptions for statin and NTG. Notified Pharmacy. Pt has been attending [facility] and goes every 2 to 3 weeks. He has been given healthy diet individualized for him. Pt stated he will begin walking for exercise but does not think CRP 2 will work with his work schedule. Willing to try Virtual program. Referred to [place] CRP 2. Pt is interested in participating in Virtual Cardiac and Pulmonary Rehab. Pt advised that Virtual Cardiac and Pulmonary Rehab is provided at no cost to the patient.  Checklist:  1. Pt has smart device  ie smartphone and/or ipad for downloading an app  Yes 2. Reliable internet/wifi service    Yes 3. Understands how to use their smartphone and navigate within an app.  Yes   Pt verbalized understanding and is in agreement.

## 2020-08-19 NOTE — Interval H&P Note (Signed)
Cath Lab Visit (complete for each Cath Lab visit)  Clinical Evaluation Leading to the Procedure:   ACS: Yes.    Non-ACS:    Anginal Classification: CCS IV  Anti-ischemic medical therapy: Minimal Therapy (1 class of medications)  Non-Invasive Test Results: No non-invasive testing performed  Prior CABG: No previous CABG   Having chest pain at rest at times.  Pain with walking in to the building this morning.   History and Physical Interval Note:  08/19/2020 7:37 AM  Hector [name]  has presented today for surgery, with the diagnosis of chest pain.  The various methods of treatment have been discussed with the patient and family. After consideration of risks, benefits and other options for treatment, the patient has consented to  Procedure(s): LEFT HEART CATH AND CORONARY ANGIOGRAPHY (N/A) as a surgical intervention.  The patient's history has been reviewed, patient examined, no change in status, stable for surgery.  I have reviewed the patient's chart and labs.  Questions were answered to the patient's satisfaction.     Larae Grooms

## 2020-08-19 NOTE — Discharge Instructions (Addendum)
Information about your medication: Brilinta (anti-platelet agent)  Generic Name (Brand): ticagrelor (Brilinta), twice daily medication  PURPOSE: You are taking this medication along with aspirin to lower your chance of having a heart attack, stroke, or blood clots in your heart stent. These can be fatal. Brilinta and aspirin help prevent platelets from sticking together and forming a clot that can block an artery or your stent.   Common SIDE EFFECTS you may experience include: bruising or bleeding more easily, shortness of breath  Do not stop taking BRILINTA without talking to the doctor who prescribes it for you. People who are treated with a stent and stop taking Brilinta too soon, have a higher risk of getting a blood clot in the stent, having a heart attack, or dying. If you stop Brilinta because of bleeding, or for other reasons, your risk of a heart attack or stroke may increase.    Tell all of your doctors and dentists that you are taking Brilinta. They should talk to the doctor who prescribed Brilinta for you before you have any surgery or invasive procedure.   Contact your health care provider if you experience: severe or uncontrollable bleeding, pink/red/brown urine, vomiting blood or vomit that looks like "coffee grounds", red or black stools (looks like tar), coughing up blood or blood clots ----------------------------------------------------------------------------------------------------------------------  Information about your medication: Statin (cholesterol-lowering agent)  Generic Name (Brand): atorvastatin (Lipitor)  PURPOSE: You are taking this medication to lower your "bad" cholesterol (LDL) and to prevent heart attacks and strokes. Statins can also raise your "good" cholesterol (HDL).  Common SIDE EFFECTS you may experience include: muscle pain or weakness (especially in the legs) and upset stomach.  Take your medication exactly as prescribed. Do not eat large amounts of  grapefruit or grapefruit juice while taking this medication.  Contact your health care provider if you experience: severe muscle pain that does not improve, dark urine, or yellowing of your skin or eyes. ----------------------------------------------------------------------------------------------------------------------  Information about your medication:  Beta Blocker (Heart rate/Blood Pressure-lowering agent)  Generic Name (Brand): Nebivilol (Bystolic)  PURPOSE: You are taking this medication to lower blood pressure and heart rate to help prevent further damage to your heart and to reduce your risk of death following your cardiac event.   Common SIDE EFFECTS you may experience include: fatigue, dizziness, diarrhea, or nausea. This medication may also mask the signs of hypoglycemia  Take your medication exactly as prescribed.   Contact your health care provider if you experience: severely lower blood pressure, syncope, palpitations, or chest pain muscle ----------------------------------------------------------------------------------------------------------------------  Drink plenty of fluids for 48 hours and keep wrist elevated at heart level for 24 hours No driving for 2 days  Radial Site Care   This sheet gives you information about how to care for yourself after your procedure. Your health care provider may also give you more specific instructions. If you have problems or questions, contact your health care provider. What can I expect after the procedure? After the procedure, it is common to have:  Bruising and tenderness at the catheter insertion area. Follow these instructions at home: Medicines  Take over-the-counter and prescription medicines only as told by your health care provider. Insertion site care 1. Follow instructions from your health care provider about how to take care of your insertion site. Make sure you: ? Wash your hands with soap and water before you  change your bandage (dressing). If soap and water are not available, use hand sanitizer. ? Remove your dressing as told  by your health care provider. In 24 hours 2. Check your insertion site every day for signs of infection. Check for: ? Redness, swelling, or pain. ? Fluid or blood. ? Pus or a bad smell. ? Warmth. 3. Do not take baths, swim, or use a hot tub until your health care provider approves. 4. You may shower 24-48 hours after the procedure, or as directed by your health care provider. ? Remove the dressing and gently wash the site with plain soap and water. ? Pat the area dry with a clean towel. ? Do not rub the site. That could cause bleeding. 5. Do not apply powder or lotion to the site. Activity   1. For 24 hours after the procedure, or as directed by your health care provider: ? Do not flex or bend the affected arm. ? Do not push or pull heavy objects with the affected arm. ? Do not drive yourself home from the hospital or clinic. You may drive 24 hours after the procedure unless your health care provider tells you not to. ? Do not operate machinery or power tools. 2. Do not lift anything that is heavier than 5 lb, or the limit that you are told, until your health care provider says that it is safe.  For 7 days 3. Ask your health care provider when it is okay to: ? Return to work or school. ? Resume usual physical activities or sports. ? Resume sexual activity. General instructions  If the catheter site starts to bleed, raise your arm and put firm pressure on the site. If the bleeding does not stop, get help right away. This is a medical emergency.  If you went home on the same day as your procedure, a responsible adult should be with you for the first 24 hours after you arrive home.  Keep all follow-up visits as told by your health care provider. This is important. Contact a health care provider if:  You have a fever.  You have redness, swelling, or yellow drainage  around your insertion site. Get help right away if:  You have unusual pain at the radial site.  The catheter insertion area swells very fast.  The insertion area is bleeding, and the bleeding does not stop when you hold steady pressure on the area.  Your arm or hand becomes pale, cool, tingly, or numb. These symptoms may represent a serious problem that is an emergency. Do not wait to see if the symptoms will go away. Get medical help right away. Call your local emergency services (911 in the [place] Do not drive yourself to the hospital. Summary  After the procedure, it is common to have bruising and tenderness at the site.  Follow instructions from your health care provider about how to take care of your radial site wound. Check the wound every day for signs of infection.  Do not lift anything that is heavier than 5 lb, or the limit that you are told, until your health care provider says that it is safe. This information is not intended to replace advice given to you by your health care provider. Make sure you discuss any questions you have with your health care provider. Document Revised: 01/19/2018 Document Reviewed: 01/19/2018 Elsevier Patient Education  2020 [employer].

## 2020-08-19 NOTE — Discharge Summary (Addendum)
Discharge Summary    Patient ID: Hector [name] MRN: [id number]; DOB: [birth year]  Admit date: 08/19/2020 Discharge date: 08/19/2020  Primary Care Provider: [name], MD  Primary Cardiologist: Larae Grooms, MD  Primary Electrophysiologist:  None   Discharge Diagnoses    Principal Problem:   Coronary artery disease involving native coronary artery of native heart with unstable angina pectoris [facility]) Active Problems:   Hypertension   Class 2 severe obesity with serious comorbidity and body mass index (BMI) of 39.0 to 39.9 in adult [facility])   Elevated cholesterol   Diagnostic Studies/Procedures    Left heart cath 08/19/20: Mid LAD lesion is 95% stenosed. Right to left collaterals. A drug-eluting stent was successfully placed using a STENT RESOLUTE ONYX 3.0X30, postdilated to > 4 mm in the mid and proximal stent and optimized with IVUS. Post intervention, there is a 0% residual stenosis. Prox RCA lesion is 30% stenosed. Minimal circumflex disease. The left ventricular systolic function is normal. LV end diastolic pressure is normal. The left ventricular ejection fraction is 55-65% by visual estimate. There is no aortic valve stenosis. Prox LAD lesion is 20% stenosed.   Severe single vessel CAD.    Due to his symptoms of unstable angina as an outpatient and the severity of the lesion, I elected to use Brilinta.  This could be changed to clopidogrel after 30 days if there are issues with Brilinta.   We will need to restart high-dose statin.  We spoke about lifestyle changes as well including avoiding smoking, healthy diet and regular exercise.  Cardiac rehab will be beneficial. _____________   History of Present Illness     Hector [name] is a 49 y.o. male with hx of normal stress echo in 2017, HTN, HLD, and morbid obesity.  Seen in 09/2018 for chest tightness while watching TV and eased with deep breathing.  He was very sedentary.  Coronary CTA showed nonobstructive CAD  less than 50% LAD and calcium score of 70 (91st percentile) recommended Lipitor 40 mg once daily to help reduce progression.    Plan in 2020 was: "RF modification has been the plan.  He does have some DOE.  We discussed heart cath but he wants to defer.  If sx get worse, he will let us know. We would consider heart cath."   He has had some chest pain that brought him to the ER 08/07/20. Records reviewed: "with history of HTN, HLD, morbid obesity presents for evaluation of intermittent central chest pain x 3-4 days that is worse at night with certain lying positions. It improves when he raises to an incline. No SOB, nausea, vomiting. He denies significant history of heartburn or stomach issues. Yesterday he had an episode of pain while walking around a retail store and getting frustrated with his daughter. "  CP reported worse with emotional stress and exertion, such as carrying groceries. Exertional chest pain relieved with rest and nitro. He described crushing, burning pain radiating to both arms.     He does smoke MJ, daily typically.  All of his chest Sx started after inhaling an ember.  He coughed very powerfully and he could see sparkles everywhere.  He thought he tore something. Overall, feels that things are getting better.     He had negative CE in ER. However, given his high calcium score and obesity, nuclear stress test was felt to not be the best option. Heart cath was discussed and ultimately scheduled.   Hospital Course  Consultants: none  Chest pain Pt presented for scheduled outpatient heart cath 08/19/20. Angiography revealed 95% lesion in the mid LAD treated with DES. Residual disease of 20% in proximal LAD and 30% in the RCA. Pt tolerated the procedure well and recovered in SS. Same day PCI protocol was followed. Pt was discharged on ASA and brilinta.    Hypertension Refilled losartan and bystoilc. Pt stated he had trouble getting refills.   Hyperlipidemia with LDL goal <  70 11/16/2019: Cholesterol, Total 136; HDL 35; LDL Chol Calc (NIH) 81; Triglycerides 105 Increased lipitor from 40 mg to 80 mg.  Repeat lipids in 6 weeks.   Morbid obesity Continue wegovy   Cardiology follow up has been arranged.   Did the patient have an acute coronary syndrome (MI, NSTEMI, STEMI, etc) this admission?:  No                               Did the patient have a percutaneous coronary intervention (stent / angioplasty)?:  Yes.     Cath/PCI Registry Performance & Quality Measures: Aspirin prescribed? - Yes ADP Receptor Inhibitor (Plavix/Clopidogrel, Brilinta/Ticagrelor or Effient/Prasugrel) prescribed (includes medically managed patients)? - Yes High Intensity Statin (Lipitor 40-10m or Crestor 20-443m prescribed? - Yes For EF <40%, was ACEI/ARB prescribed? - Yes For EF <40%, Aldosterone Antagonist (Spironolactone or Eplerenone) prescribed? - Not Applicable (EF >/= 4032%Cardiac Rehab Phase II ordered? - Yes   _____________  Discharge Vitals Blood pressure (!) 126/56, pulse (!) 55, temperature 97.8 F (36.6 C), temperature source Oral, resp. rate (!) 24, height 5' 10"  (1.778 m), weight 120.2 kg, SpO2 97 %.  Filed Weights   08/19/20 0531  Weight: 120.2 kg    Labs & Radiologic Studies    CBC No results for input(s): WBC, NEUTROABS, HGB, HCT, MCV, PLT in the last 72 hours. Basic Metabolic Panel No results for input(s): NA, K, CL, CO2, GLUCOSE, BUN, CREATININE, CALCIUM, MG, PHOS in the last 72 hours. Liver Function Tests No results for input(s): AST, ALT, ALKPHOS, BILITOT, PROT, ALBUMIN in the last 72 hours. No results for input(s): LIPASE, AMYLASE in the last 72 hours. High Sensitivity Troponin:   Recent Labs  Lab 08/07/20 1233 08/07/20 1720  TROPONINIHS 13 13    BNP Invalid input(s): POCBNP D-Dimer No results for input(s): DDIMER in the last 72 hours. Hemoglobin A1C No results for input(s): HGBA1C in the last 72 hours. Fasting Lipid Panel No results  for input(s): CHOL, HDL, LDLCALC, TRIG, CHOLHDL, LDLDIRECT in the last 72 hours. Thyroid Function Tests No results for input(s): TSH, T4TOTAL, T3FREE, THYROIDAB in the last 72 hours.  Invalid input(s): FREET3 _____________  DG Chest 2 View  Result Date: 08/07/2020 CLINICAL DATA:  Chest pain. Additional provided: Chest pain that runs down both arms. EXAM: CHEST - 2 VIEW COMPARISON:  Cardiac CT 12/14/2018. FINDINGS: Heart size within normal limits. No appreciable airspace consolidation or pulmonary edema. No evidence of pleural effusion or pneumothorax. Redemonstrated calcified granuloma within the right lower lobe. No acute bony abnormality is identified. IMPRESSION: No evidence of active cardiopulmonary disease. Electronically Signed   By: KyKellie SimmeringO   On: 08/07/2020 12:55   CARDIAC CATHETERIZATION  Result Date: 08/19/2020  Mid LAD lesion is 95% stenosed. Right to left collaterals.  A drug-eluting stent was successfully placed using a STENT RESOLUTE ONYX 3.0X30, postdilated to > 4 mm in the mid and proximal stent and optimized with IVUS.  Post intervention, there is a 0% residual stenosis.  Prox RCA lesion is 30% stenosed. Minimal circumflex disease.  The left ventricular systolic function is normal.  LV end diastolic pressure is normal.  The left ventricular ejection fraction is 55-65% by visual estimate.  There is no aortic valve stenosis.  Prox LAD lesion is 20% stenosed.  Severe single vessel CAD. Due to his symptoms of unstable angina as an outpatient and the severity of the lesion, I elected to use Brilinta.  This could be changed to clopidogrel after 30 days if there are issues with Brilinta. We will need to restart high-dose statin.  We spoke about lifestyle changes as well including avoiding smoking, healthy diet and regular exercise.  Cardiac rehab will be beneficial.   Disposition   Pt is being discharged home today in good condition.  Follow-up Plans & Appointments      Follow-up Information     Liliane Shi, PA-C Follow up on 08/28/2020.   Specialties: Cardiology, Physician Assistant Why: 8:45 am for hospital follow up Contact information: [address] [place] [address] [place] [phone]                Discharge Instructions     AMB Referral to Cardiac Rehabilitation - Phase II   Complete by: As directed    Diagnosis: Coronary Stents   After initial evaluation and assessments completed: Virtual Based Care may be provided alone or in conjunction with Phase 2 Cardiac Rehab based on patient barriers.: Yes   Diet - low sodium heart healthy   Complete by: As directed    Discharge instructions   Complete by: As directed    No driving for 2 days. No lifting over 5 lbs for 1 week. No sexual activity for 1 week. You may return to work in 1 week. Keep procedure site clean & dry. If you notice increased pain, swelling, bleeding or pus, call/return!  You may shower, but no soaking baths/hot tubs/pools for 1 week.   Increase activity slowly   Complete by: As directed        Discharge Medications   Allergies as of 08/19/2020   No Known Allergies      Medication List     TAKE these medications    aspirin EC 81 MG tablet Take 1 tablet (81 mg total) by mouth daily. Swallow whole.   atorvastatin 80 MG tablet Commonly known as: LIPITOR Take 1 tablet (80 mg total) by mouth daily. What changed:  medication strength how much to take   FLUoxetine 10 MG capsule Commonly known as: PROZAC TAKE 1 CAPSULE DAILY   LORazepam 1 MG tablet Commonly known as: ATIVAN Take 1 tablet (1 mg total) by mouth every 8 (eight) hours as needed for anxiety.   losartan 50 MG tablet Commonly known as: COZAAR Take 1 tablet (50 mg total) by mouth daily.   nebivolol 10 MG tablet Commonly known as: Bystolic TAKE 1 TABLET BY MOUTH ONCE DAILY. NEED APPT FOR REFILLS   nitroGLYCERIN 0.4 MG SL tablet Commonly known as: NITROSTAT Place 1 tablet  (0.4 mg total) under the tongue every 5 (five) minutes as needed for chest pain.   ticagrelor 90 MG Tabs tablet Commonly known as: BRILINTA Take 1 tablet (90 mg total) by mouth 2 (two) times daily.   Vitamin D (Ergocalciferol) 1.25 MG (50000 UNIT) Caps capsule Commonly known as: DRISDOL Take 1 capsule (50,000 Units total) by mouth every 7 (seven) days.   Wegovy 0.25  MG/0.5ML Soaj Generic drug: Semaglutide-Weight Management Inject 0.25 mg into the skin once a week.           Outstanding Labs/Studies   Lipids in 6 weeks  Duration of Discharge Encounter   Greater than 30 minutes including physician time.  Signed, Tami Lin Duke, PA 08/19/2020, 11:41 AM  I have examined the patient and reviewed assessment and plan and discussed with patient.  Agree with above as stated.  S/p PCI of LAD.  Stressed importance of DAPT and secondary prevention.  Healthy diet and cardiac rehab will be beneficial.  BP control and high dose statin.  Radial precautions.  Larae Grooms

## 2020-08-20 ENCOUNTER — Encounter (HOSPITAL_COMMUNITY): Payer: Self-pay | Admitting: Interventional Cardiology

## 2020-08-20 MED FILL — Tirofiban HCl in NaCl 0.9% IV Soln 5 MG/100ML (Base Equiv): INTRAVENOUS | Qty: 100 | Status: AC

## 2020-08-21 ENCOUNTER — Telehealth (HOSPITAL_COMMUNITY): Payer: Self-pay

## 2020-08-21 NOTE — Telephone Encounter (Signed)
Pt insurance is active and benefits verified through [employer]. Co-pay $0.00, DED $700.00/$0.00 met, out of pocket $4,000.00/$788.33 met, co-insurance 20%. No pre-authorization required. Mary/[employer], 08/21/20 @2 :53PM, YEM#[id number]  Will contact patient to see if he is interested in the Cardiac Rehab Program. If interested, patient will need to complete follow up appt. Once completed, patient will be contacted for scheduling upon review by the RN Navigator.

## 2020-08-21 NOTE — Telephone Encounter (Signed)
Attempted to call patient in regards to Cardiac Rehab - LM on VM 

## 2020-08-26 ENCOUNTER — Ambulatory Visit (INDEPENDENT_AMBULATORY_CARE_PROVIDER_SITE_OTHER): Payer: No Typology Code available for payment source | Admitting: Family Medicine

## 2020-08-26 ENCOUNTER — Other Ambulatory Visit: Payer: Self-pay

## 2020-08-26 ENCOUNTER — Encounter (INDEPENDENT_AMBULATORY_CARE_PROVIDER_SITE_OTHER): Payer: Self-pay | Admitting: Family Medicine

## 2020-08-26 VITALS — BP 115/72 | HR 52 | Temp 98.1°F | Ht 69.0 in | Wt 265.0 lb

## 2020-08-26 DIAGNOSIS — F3289 Other specified depressive episodes: Secondary | ICD-10-CM

## 2020-08-26 DIAGNOSIS — I25119 Atherosclerotic heart disease of native coronary artery with unspecified angina pectoris: Secondary | ICD-10-CM | POA: Diagnosis not present

## 2020-08-26 DIAGNOSIS — Z9189 Other specified personal risk factors, not elsewhere classified: Secondary | ICD-10-CM | POA: Diagnosis not present

## 2020-08-26 DIAGNOSIS — Z6839 Body mass index (BMI) 39.0-39.9, adult: Secondary | ICD-10-CM

## 2020-08-27 NOTE — Progress Notes (Signed)
Chief Complaint:   OBESITY Hector [name] is here to discuss his progress with his obesity treatment plan along with follow-up of his obesity related diagnoses. Hector [name] is on the Category 2 Plan and states he is following his eating plan approximately 25% of the time. Hector [name] states he is exercising for 0 minutes 0 times per week.  Today's visit was #: 73 Starting weight: 282 lbs Starting date: 01/30/2020 Today's weight: 265 lbs Today's date: 08/26/2020 Total lbs lost to date: 17 lbs Total lbs lost since last in-office visit: 2 lbs Total weight loss percentage to date: -6.03%  Interim History: Hector [name] had recent mid LAD 95% stenosis and is now status post drug-eluting stent.  He reports no more angina.  Assessment/Plan:   1. Coronary artery disease with angina pectoris Now followed by Cardiology. He is more motivated to lose weight and make healthy choices.  2. Other depression, emotional eating Hector [name] is struggling with emotional eating and using food for comfort to the extent that it is negatively impacting his health. He has been working on behavior modification techniques to help reduce his emotional eating and has been successful. He shows no sign of suicidal or homicidal ideations.  Behavior modification techniques were discussed today to help Hector [name] deal with his emotional/non-hunger eating behaviors.    3. At risk for constipation Hector [name] is at increased risk for constipation due to taking [facility]. Hector [name] denies hard, infrequent stools currently.   4. Class 2 severe obesity with serious comorbidity and body mass index (BMI) of 39.0 to 39.9 in adult, unspecified obesity type (HCC) Hector [name] is currently in the action stage of change. As such, his goal is to continue with weight loss efforts. He has agreed to the Category 2 Plan.   Exercise goals: No exercise has been prescribed at this time.  Consider cardiac rehab.  Behavioral modification strategies: increasing lean protein intake, decreasing simple  carbohydrates, increasing vegetables, increasing water intake, decreasing liquid calories, decreasing alcohol intake, decreasing sodium intake and increasing high fiber foods.  Hector [name] has agreed to follow-up with our clinic in 3-4 weeks. He was informed of the importance of frequent follow-up visits to maximize his success with intensive lifestyle modifications for his multiple health conditions.   Objective:   Blood pressure 115/72, pulse (!) 52, temperature 98.1 F (36.7 C), temperature source Oral, height 5' 9"  (1.753 m), weight 265 lb (120.2 kg), SpO2 97 %. Body mass index is 39.13 kg/m.  General: Cooperative, alert, well developed, in no acute distress. HEENT: Conjunctivae and lids unremarkable. Cardiovascular: Regular rhythm.  Lungs: Normal work of breathing. Neurologic: No focal deficits.   Lab Results  Component Value Date   CREATININE 1.35 (H) 08/07/2020   BUN 25 (H) 08/07/2020   NA 137 08/07/2020   K 3.9 08/07/2020   CL 103 08/07/2020   CO2 21 (L) 08/07/2020   Lab Results  Component Value Date   ALT 21 11/16/2019   AST 18 11/16/2019   ALKPHOS 89 11/16/2019   BILITOT 0.3 11/16/2019   Lab Results  Component Value Date   HGBA1C 5.2 11/16/2019   Lab Results  Component Value Date   INSULIN 35.9 (H) 11/16/2019   Lab Results  Component Value Date   TSH 2.410 11/16/2019   Lab Results  Component Value Date   CHOL 136 11/16/2019   HDL 35 (L) 11/16/2019   LDLCALC 81 11/16/2019   TRIG 105 11/16/2019   CHOLHDL 3 06/12/2019   Lab Results  Component Value Date  WBC 4.5 08/07/2020   HGB 15.2 08/07/2020   HCT 44.2 08/07/2020   MCV 85.8 08/07/2020   PLT 180 08/07/2020   Attestation Statements:   Reviewed by clinician on day of visit: allergies, medications, problem list, medical history, surgical history, family history, social history, and previous encounter notes.  I, Water quality scientist, CMA, am acting as transcriptionist for Briscoe Deutscher, DO  I have reviewed the  above documentation for accuracy and completeness, and I agree with the above. Briscoe Deutscher, DO

## 2020-08-27 NOTE — Progress Notes (Signed)
Cardiology Office Note:    Date:  08/28/2020   ID:  Hector [name], DOB [birth year], MRN [id number]  PCP:  Hector Post, MD  [facility] Cardiologist:  Hector Grooms, MD   [facility] Electrophysiologist:  None   Referring MD: Hector Post, MD   Chief Complaint:  Hospitalization Follow-up (Status [name] PCI)    Patient Profile:    Hector [name] is a 49 y.o. male with:   Coronary artery disease  CT in 2019: non-obs disease  S/p DES to the mid LAD 8/21  Hypertension   Hyperlipidemia   Morbid obesity   Marijuana use   Prior CV studies: Cardiac catheterization 23-Aug-2020 LAD proximal 20, mid 95; second septal branch fills by collaterals from RPDA LCx irregularities RCA proximal 30 EF 55-65 PCI: 3 x 30 mm Resolute Onyx DES to mid LAD  History of Present Illness:    Hector [name] was last seen by Hector [name] 08/09/20 via Telemedicine.  He was having symptoms c/w unstable angina and cardiac catheterization was arranged.  This demonstrated high grade mid LAD stenosis which was tx with a DES.  He was DC the same day.  He returns for follow up.  He is here alone.  He has not had further angina.  He has had a little bit of shortness of breath related to ticagrelor.  This is self-limited.  He has not had dizziness, lightheadedness or near syncope.  He has had some bruising since starting on ticagrelor.  He may be interested in going to cardiac rehabilitation.      Past Medical History:  Diagnosis Date  . Alcohol abuse   . Allergy   . Anal fissure   . Anxiety   . Asthma   . Back pain   . Bilateral swelling of feet   . Blood in stool   . Chest pain   . Depression   . Drug use   . Febrile seizure ([place])   . Heartburn   . Hyperlipidemia   . Hypertension   . Irritable bowel syndrome with diarrhea   . Jaundice, newborn   . Palpitations   . Panic   . Panic disorder   . Pre-diabetes   . Seizures (HCC)    febrile  . Shortness of breath   . Sleep apnea      Current Medications: Current Meds  Medication Sig  . aspirin EC 81 MG tablet Take 1 tablet (81 mg total) by mouth daily. Swallow whole.  Marland Kitchen atorvastatin (LIPITOR) 80 MG tablet Take 1 tablet (80 mg total) by mouth daily.  Marland Kitchen FLUoxetine (PROZAC) 10 MG capsule TAKE 1 CAPSULE DAILY  . LORazepam (ATIVAN) 1 MG tablet Take 1 tablet (1 mg total) by mouth every 8 (eight) hours as needed for anxiety.  Marland Kitchen losartan (COZAAR) 50 MG tablet Take 1 tablet (50 mg total) by mouth daily.  . nebivolol (BYSTOLIC) 10 MG tablet TAKE 1 TABLET BY MOUTH ONCE DAILY  . nitroGLYCERIN (NITROSTAT) 0.4 MG SL tablet Place 1 tablet (0.4 mg total) under the tongue every 5 (five) minutes as needed for chest pain.  . Semaglutide-Weight Management (WEGOVY) 0.25 MG/0.5ML SOAJ Inject 0.25 mg into the skin once a week.  . ticagrelor (BRILINTA) 90 MG TABS tablet Take 1 tablet (90 mg total) by mouth 2 (two) times daily.  . Vitamin D, Ergocalciferol, (DRISDOL) 1.25 MG (50000 UNIT) CAPS capsule Take 1 capsule (50,000 Units total) by mouth every 7 (seven) days.     Allergies:  Patient has no known allergies.   Social History   Tobacco Use  . Smoking status: Never Smoker  . Smokeless tobacco: Never Used  Vaping Use  . Vaping Use: Former  Substance Use Topics  . Alcohol use: Yes    Alcohol/week: 0.0 standard drinks    Comment: 1-5 per week  . Drug use: Yes    Types: Marijuana     Family Hx: The patient's family history includes Basal cell carcinoma in his mother; Cancer in his mother; Colitis in his brother; Colon polyps in his brother and father; Dementia in his mother; Depression in his father and mother; Diabetes in his father; Heart attack in his father; Heart disease (age of onset: 59) in his father; Hyperlipidemia in his father; Hypertension in his father and mother; Obesity in his father; Skin cancer in his mother; Sleep apnea in his father; Squamous cell carcinoma in his mother; Stroke in his father and mother.  ROS    EKGs/Labs/Other Test Reviewed:    EKG:  EKG is   ordered today.  The ekg ordered today demonstrates sinus bradycardia, HR 50, normal axis, nonspecific ST-T wave changes, QTC 417, no significant change  Recent Labs: 11/16/2019: ALT 21; TSH 2.410 08/07/2020: BUN 25; Creatinine, Ser 1.35; Hemoglobin 15.2; Platelets 180; Potassium 3.9; Sodium 137   Recent Lipid Panel Lab Results  Component Value Date/Time   CHOL 136 11/16/2019 10:07 AM   TRIG 105 11/16/2019 10:07 AM   HDL 35 (L) 11/16/2019 10:07 AM   CHOLHDL 3 06/12/2019 10:59 AM   LDLCALC 81 11/16/2019 10:07 AM    Physical Exam:    VS:  BP 100/64   Pulse (!) 50   Ht 5' 9"  (1.753 m)   Wt 269 lb (122 kg)   SpO2 96%   BMI 39.72 kg/m     Wt Readings from Last 3 Encounters:  08/28/20 269 lb (122 kg)  08/26/20 265 lb (120.2 kg)  08/19/20 265 lb (120.2 kg)     Constitutional:      Appearance: Healthy appearance. Not in distress.  Neck:     Thyroid: No thyromegaly.     Vascular: JVD normal.  Pulmonary:     Breath sounds: No wheezing. No rales.  Cardiovascular:     Normal rate. Regular rhythm. Normal S1. Normal S2.     Murmurs: There is no murmur.     Comments: R wrist without hematoma Pulses:    Intact distal pulses.  Edema:    Peripheral edema absent.  Abdominal:     Palpations: Abdomen is soft. There is no hepatomegaly.  Skin:    General: Skin is warm and dry.     Findings: Ecchymosis (few bilat arms) present.  Neurological:     General: No focal deficit present.     Mental Status: Alert and oriented to person, place and time.     Cranial Nerves: Cranial nerves are intact.       ASSESSMENT & PLAN:    1. Coronary artery disease involving native coronary artery of native heart without angina pectoris Status [name] recent drug-eluting stent to the LAD.  He is doing well without recurrent anginal symptoms.  We discussed the importance of dual antiplatelet therapy for minimum of 1 year.  If he has any difficulty  with affording Ticagrelor, he knows to contact us so we can switch him to clopidogrel.  Continue aspirin, ticagrelor, atorvastatin, nebivolol.  I have encouraged him to start cardiac rehabilitation.  If he cannot do this,  I have encouraged him to continue to increase his activity on his own.  Follow-up with Hector [name] in 3 months.  2. Essential hypertension The patient's blood pressure is controlled on his current regimen.  Continue current therapy.   3. Mixed hyperlipidemia Continue high intensity statin therapy.  Obtain follow-up fasting CMET, lipids in 3 months.  4. Morbid obesity ([place]) He is due to start semaglutide soon to help with weight loss.  5. Bruising I have reassured him that this is all related to dual antiplatelet therapy.  If his bruising becomes worse, we can check a platelet count and possibly consider switching him to clopidogrel.    Dispo:  Return in about 3 months (around 11/27/2020) for Routine Follow Up with Hector [name], in person.   Medication Adjustments/Labs and Tests Ordered: Current medicines are reviewed at length with the patient today.  Concerns regarding medicines are outlined above.  Tests Ordered: Orders Placed This Encounter  Procedures  . EKG 12-Lead   Medication Changes: No orders of the defined types were placed in this encounter.   Signed, Richardson Dopp, PA-C  08/28/2020 10:18 AM    [facility] [place], [place], [place]  [address] Phone: [phone]; Fax: [phone]

## 2020-08-28 ENCOUNTER — Other Ambulatory Visit: Payer: Self-pay

## 2020-08-28 ENCOUNTER — Encounter: Payer: Self-pay | Admitting: Physician Assistant

## 2020-08-28 ENCOUNTER — Ambulatory Visit: Payer: No Typology Code available for payment source | Admitting: Physician Assistant

## 2020-08-28 VITALS — BP 100/64 | HR 50 | Ht 69.0 in | Wt 269.0 lb

## 2020-08-28 DIAGNOSIS — I2510 Atherosclerotic heart disease of native coronary artery without angina pectoris: Secondary | ICD-10-CM | POA: Diagnosis not present

## 2020-08-28 DIAGNOSIS — E782 Mixed hyperlipidemia: Secondary | ICD-10-CM | POA: Diagnosis not present

## 2020-08-28 DIAGNOSIS — I10 Essential (primary) hypertension: Secondary | ICD-10-CM

## 2020-08-28 DIAGNOSIS — T148XXA Other injury of unspecified body region, initial encounter: Secondary | ICD-10-CM

## 2020-08-28 NOTE — Patient Instructions (Addendum)
Medication Instructions:  Your physician recommends that you continue on your current medications as directed. Please refer to the Current Medication list given to you today.  *If you need a refill on your cardiac medications before your next appointment, please call your pharmacy*  Lab Work: None ordered today  If you have labs (blood work) drawn today and your tests are completely normal, you will receive your results only by: Marland Kitchen MyChart Message (if you have MyChart) OR . A paper copy in the mail If you have any lab test that is abnormal or we need to change your treatment, we will call you to review the results.  Testing/Procedures: None ordered today  Follow-Up: On 12/02/20 at 8:40AM with Casandra Doffing, MD **come fasting your cholesterol will be checked**

## 2020-09-04 ENCOUNTER — Ambulatory Visit: Payer: No Typology Code available for payment source | Admitting: Physician Assistant

## 2020-09-06 ENCOUNTER — Telehealth (HOSPITAL_COMMUNITY): Payer: Self-pay

## 2020-09-06 ENCOUNTER — Other Ambulatory Visit: Payer: Self-pay

## 2020-09-06 ENCOUNTER — Encounter (HOSPITAL_COMMUNITY): Payer: Self-pay

## 2020-09-06 NOTE — Telephone Encounter (Signed)
Attempted to call patient in regards to Cardiac Rehab - LM on VM Mailed letter 

## 2020-09-09 ENCOUNTER — Ambulatory Visit: Payer: No Typology Code available for payment source | Admitting: Family Medicine

## 2020-09-09 ENCOUNTER — Other Ambulatory Visit: Payer: Self-pay

## 2020-09-09 ENCOUNTER — Encounter: Payer: Self-pay | Admitting: Family Medicine

## 2020-09-09 VITALS — BP 120/80 | HR 49 | Temp 98.1°F | Ht 69.0 in | Wt 272.0 lb

## 2020-09-09 DIAGNOSIS — I10 Essential (primary) hypertension: Secondary | ICD-10-CM

## 2020-09-09 DIAGNOSIS — Z23 Encounter for immunization: Secondary | ICD-10-CM | POA: Diagnosis not present

## 2020-09-09 DIAGNOSIS — E7800 Pure hypercholesterolemia, unspecified: Secondary | ICD-10-CM

## 2020-09-09 DIAGNOSIS — I25110 Atherosclerotic heart disease of native coronary artery with unstable angina pectoris: Secondary | ICD-10-CM | POA: Diagnosis not present

## 2020-09-09 NOTE — Progress Notes (Signed)
Established Patient Office Visit  Subjective:  Patient ID: Hector [name], male    DOB: 08/18/1971  Age: 49 y.o. MRN: [id number]  CC:  Chief Complaint  Patient presents with  . Hypertension    HPI [name] presents for scheduled 26-monthfollow-up.  He has longstanding history of GERD and had called recently with concerns that he was having some ongoing GERD symptoms.  He had already been seen by cardiology with recommendation for cardiac cath.  He called here requesting referral for endoscopy and we recommended that he get catheterization first to clarify his cardiac status.  He went ahead with a cardiac cath which did reveal a 95% mid LAD lesion which was successfully stented.  He states he felt better immediately afterwards.  He had no further chest pain.  He feels he has somewhat increased stamina.  He was started on Brilinta and also taking aspirin.  He is on Bystolic 10 mg daily and Lipitor 80 mg daily as well as losartan 50 mg daily He plans to get follow-up fasting lipids with cardiology soon.  He has scheduled cardiac rehab program.  He has longstanding history of obesity.  He is currently followed by weight loss management.  Patient has inquiries regarding scheduling colonoscopy.  He states that his father and brother have had "precancerous polyps ".  Past Medical History:  Diagnosis Date  . Alcohol abuse   . Allergy   . Anal fissure   . Anxiety   . Asthma   . Back pain   . Bilateral swelling of feet   . Blood in stool   . Chest pain   . Depression   . Drug use   . [place]   . Heartburn   . Hyperlipidemia   . Hypertension   . Irritable bowel syndrome with diarrhea   . Jaundice, newborn   . Palpitations   . Panic   . Panic disorder   . Pre-diabetes   . Seizures (HCC)    febrile  . Shortness of breath   . Sleep apnea     Past Surgical History:  Procedure Laterality Date  . CORONARY STENT INTERVENTION N/A 08/19/2020   Procedure: CORONARY  STENT INTERVENTION;  Surgeon: VJettie Booze MD;  [facility];  Service: Cardiovascular;  Laterality: N/A;  . HEMORRHOID BANDING    . INTRAVASCULAR ULTRASOUND/IVUS N/A 08/19/2020   Procedure: Intravascular Ultrasound/IVUS;  Surgeon: VJettie Booze MD;  [facility];  Service: Cardiovascular;  Laterality: N/A;  . LEFT HEART CATH AND CORONARY ANGIOGRAPHY N/A 08/19/2020   Procedure: LEFT HEART CATH AND CORONARY ANGIOGRAPHY;  Surgeon: VJettie Booze MD;  [facility];  Service: Cardiovascular;  Laterality: N/A;  . WISDOM TOOTH EXTRACTION  1991    Family History  Problem Relation Age of Onset  . Dementia Mother   . Stroke Mother   . Hypertension Mother   . Skin cancer Mother   . Basal cell carcinoma Mother   . Squamous cell carcinoma Mother   . Cancer Mother   . Depression Mother   . Diabetes Father   . Hyperlipidemia Father   . Stroke Father   . Heart disease Father 588 . Colon polyps Father   . Heart attack Father   . Hypertension Father   . Depression Father   . Sleep apnea Father   . Obesity Father   . Colitis Brother   . Colon polyps Brother  Social History   Socioeconomic History  . Marital status: Legally Separated    Spouse name: Not on file  . Number of children: 1  . Years of education: Not on file  . Highest education level: Bachelor's degree (e.g., BA, AB, BS)  Occupational History  . Occupation: Herbalist  Tobacco Use  . Smoking status: Never Smoker  . Smokeless tobacco: Never Used  Vaping Use  . Vaping Use: Former  Substance and Sexual Activity  . Alcohol use: Yes    Alcohol/week: 0.0 standard drinks    Comment: 1-5 per week  . Drug use: Yes    Types: Marijuana  . Sexual activity: Not on file  Other Topics Concern  . Not on file  Social History Narrative   Patient is right-handed. He lives in a 2 story home. He drinks 2 20 oz sodas a day. He does not exercise.   Social  Determinants of Health   Financial Resource Strain:   . Difficulty of Paying Living Expenses: Not on file  Food Insecurity:   . Worried About Charity fundraiser in the Last Year: Not on file  . Ran Out of Food in the Last Year: Not on file  Transportation Needs:   . Lack of Transportation (Medical): Not on file  . Lack of Transportation (Non-Medical): Not on file  Physical Activity:   . Days of Exercise per Week: Not on file  . Minutes of Exercise per Session: Not on file  Stress:   . Feeling of Stress : Not on file  Social Connections:   . Frequency of Communication with Friends and Family: Not on file  . Frequency of Social Gatherings with Friends and Family: Not on file  . Attends Religious Services: Not on file  . Active Member of Clubs or Organizations: Not on file  . Attends Archivist Meetings: Not on file  . Marital Status: Not on file  Intimate Partner Violence:   . Fear of Current or Ex-Partner: Not on file  . Emotionally Abused: Not on file  . Physically Abused: Not on file  . Sexually Abused: Not on file    Outpatient Medications Prior to Visit  Medication Sig Dispense Refill  . aspirin EC 81 MG tablet Take 1 tablet (81 mg total) by mouth daily. Swallow whole. 150 tablet 2  . atorvastatin (LIPITOR) 80 MG tablet Take 1 tablet (80 mg total) by mouth daily. 90 tablet 3  . FLUoxetine (PROZAC) 10 MG capsule TAKE 1 CAPSULE DAILY 90 capsule 3  . LORazepam (ATIVAN) 1 MG tablet Take 1 tablet (1 mg total) by mouth every 8 (eight) hours as needed for anxiety. 30 tablet 0  . losartan (COZAAR) 50 MG tablet Take 1 tablet (50 mg total) by mouth daily. 90 tablet 3  . nebivolol (BYSTOLIC) 10 MG tablet TAKE 1 TABLET BY MOUTH ONCE DAILY 90 tablet 3  . Semaglutide-Weight Management (WEGOVY) 0.25 MG/0.5ML SOAJ Inject 0.25 mg into the skin once a week. 2 mL 0  . ticagrelor (BRILINTA) 90 MG TABS tablet Take 1 tablet (90 mg total) by mouth 2 (two) times daily. 180 tablet 3  .  Vitamin D, Ergocalciferol, (DRISDOL) 1.25 MG (50000 UNIT) CAPS capsule Take 1 capsule (50,000 Units total) by mouth every 7 (seven) days. 4 capsule 0  . nitroGLYCERIN (NITROSTAT) 0.4 MG SL tablet Place 1 tablet (0.4 mg total) under the tongue every 5 (five) minutes as needed for chest pain. 25 tablet 3  No facility-administered medications prior to visit.    No Known Allergies  ROS Review of Systems  Constitutional: Negative for fatigue and unexpected weight change.  Eyes: Negative for visual disturbance.  Respiratory: Negative for cough, chest tightness and shortness of breath.   Cardiovascular: Negative for chest pain, palpitations and leg swelling.  Gastrointestinal: Negative for abdominal pain and blood in stool.  Neurological: Negative for dizziness, syncope, weakness, light-headedness and headaches.      Objective:    Physical Exam Vitals reviewed.  Cardiovascular:     Rate and Rhythm: Normal rate and regular rhythm.  Pulmonary:     Effort: Pulmonary effort is normal.     Breath sounds: Normal breath sounds.  Musculoskeletal:     Comments: Trace nonpitting edema legs bilaterally  Neurological:     Mental Status: He is alert.     BP 120/80   Pulse (!) 49   Temp 98.1 F (36.7 C) (Oral)   Ht 5' 9"  (1.753 m)   Wt 272 lb (123.4 kg)   SpO2 97%   BMI 40.17 kg/m  Wt Readings from Last 3 Encounters:  09/09/20 272 lb (123.4 kg)  08/28/20 269 lb (122 kg)  08/26/20 265 lb (120.2 kg)     Health Maintenance Due  Topic Date Due  . Hepatitis C Screening  Never done  . HIV Screening  Never done    There are no preventive care reminders to display for this patient.  Lab Results  Component Value Date   TSH 2.410 11/16/2019   Lab Results  Component Value Date   WBC 4.5 08/07/2020   HGB 15.2 08/07/2020   HCT 44.2 08/07/2020   MCV 85.8 08/07/2020   PLT 180 08/07/2020   Lab Results  Component Value Date   NA 137 08/07/2020   K 3.9 08/07/2020   CO2 21 (L)  08/07/2020   GLUCOSE 171 (H) 08/07/2020   BUN 25 (H) 08/07/2020   CREATININE 1.35 (H) 08/07/2020   BILITOT 0.3 11/16/2019   ALKPHOS 89 11/16/2019   AST 18 11/16/2019   ALT 21 11/16/2019   PROT 6.5 11/16/2019   ALBUMIN 4.3 11/16/2019   CALCIUM 9.5 08/07/2020   ANIONGAP 13 08/07/2020   GFR 100.42 06/12/2019   Lab Results  Component Value Date   CHOL 136 11/16/2019   Lab Results  Component Value Date   HDL 35 (L) 11/16/2019   Lab Results  Component Value Date   LDLCALC 81 11/16/2019   Lab Results  Component Value Date   TRIG 105 11/16/2019   Lab Results  Component Value Date   CHOLHDL 3 06/12/2019   Lab Results  Component Value Date   HGBA1C 5.2 11/16/2019      Assessment & Plan:    #1 CAD with recent stenting of 95% mid LAD lesion.  Patient improved symptomatically.  -Continue close follow-up with cardiology and he plans to get follow-up fasting lipids through them.  Goal LDL less than 70.  -We did recommend flu vaccine and patient consents  #2 hypertension stable and at goal  #3 family history of colon polyps.  Patient inquiring about colonoscopy.  We recommended that he consult with cardiology regarding timing.  #4 obesity.  Strongly encouraged to lose weight.  He is encouraged to continue follow-up through the weight loss clinic  No orders of the defined types were placed in this encounter.   Follow-up: No follow-ups on file.    Carolann Littler, MD

## 2020-09-09 NOTE — Patient Instructions (Signed)
Check with cardiology regarding timing of colonoscopy.

## 2020-09-11 NOTE — Telephone Encounter (Signed)
Noted  

## 2020-09-17 ENCOUNTER — Ambulatory Visit (INDEPENDENT_AMBULATORY_CARE_PROVIDER_SITE_OTHER): Payer: No Typology Code available for payment source | Admitting: Family Medicine

## 2020-09-17 ENCOUNTER — Other Ambulatory Visit: Payer: Self-pay

## 2020-09-17 ENCOUNTER — Encounter (INDEPENDENT_AMBULATORY_CARE_PROVIDER_SITE_OTHER): Payer: Self-pay | Admitting: Family Medicine

## 2020-09-17 VITALS — BP 120/74 | HR 51 | Temp 98.2°F | Ht 69.0 in | Wt 268.0 lb

## 2020-09-17 DIAGNOSIS — E8881 Metabolic syndrome: Secondary | ICD-10-CM | POA: Diagnosis not present

## 2020-09-17 DIAGNOSIS — F3289 Other specified depressive episodes: Secondary | ICD-10-CM | POA: Diagnosis not present

## 2020-09-17 DIAGNOSIS — Z9189 Other specified personal risk factors, not elsewhere classified: Secondary | ICD-10-CM

## 2020-09-17 DIAGNOSIS — Z6839 Body mass index (BMI) 39.0-39.9, adult: Secondary | ICD-10-CM

## 2020-09-17 DIAGNOSIS — I2510 Atherosclerotic heart disease of native coronary artery without angina pectoris: Secondary | ICD-10-CM | POA: Diagnosis not present

## 2020-09-18 ENCOUNTER — Encounter (INDEPENDENT_AMBULATORY_CARE_PROVIDER_SITE_OTHER): Payer: Self-pay | Admitting: Family Medicine

## 2020-09-19 ENCOUNTER — Ambulatory Visit: Payer: No Typology Code available for payment source | Admitting: Physician Assistant

## 2020-09-19 NOTE — Progress Notes (Signed)
Chief Complaint:   OBESITY Hector [name] is here to discuss his progress with his obesity treatment plan along with follow-up of his obesity related diagnoses. Hector [name] is on the Category 2 Plan and states he is following his eating plan approximately 30% of the time. Hector [name] states he is exercising for 0 minutes 0 times per week.  Today's visit was #: 14 Starting weight: 282 lbs Starting date: 01/30/2020 Today's weight: 268 lbs Today's date: 09/17/2020 Total lbs lost to date: 14 lbs Total lbs lost since last in-office visit: 0 Total weight loss percentage to date: -4.96%  Interim History: Hector [name] says he signed up for Cardiac Rehab and will start October 25th.  He still has not started [facility] yet.  He is back to eating junk food, drinking some alcohol, and smoking THC.  Today's bioimpedance results indicate that Hector [name] has gained 6 pounds of water weight since his last visit. Plan: We discussed his barriers to starting the [facility].   Assessment/Plan:   1. Coronary artery disease ([place]) Status post angioplasty with stent on 08/19/2020. He will start Cardiac Rehab next month. I've encouraged him to take advantage of it.   2. Metabolic syndrome Increased visceral obesity at 21. He will continue to focus on protein-rich, low simple carbohydrate foods. We reviewed the importance of hydration, regular exercise for stress reduction, and restorative sleep.   3. Other depression, emotional eating Behavior modification techniques were discussed today to help Hector [name] deal with his emotional/non-hunger eating behaviors.    4. At risk for nausea Hector [name] was given approximately 15 minutes of nausea prevention counseling today. Hector [name] is at risk for nausea due to being on [facility]. He was encouraged to titrate his medication slowly, make sure to stay hydrated, eat smaller portions throughout the day, and avoid high fat meals.   5. Class 2 severe obesity with serious comorbidity and body mass index (BMI) of 39.0 to 39.9  in adult, unspecified obesity type (HCC) Hector [name] is currently in the action stage of change. As such, his goal is to continue with weight loss efforts. He has agreed to the Category 2 Plan.   Exercise goals: Cardiac Rehab.  Behavioral modification strategies: increasing water intake, decreasing alcohol intake, decreasing sodium intake and emotional eating strategies.  Hector [name] has agreed to follow-up with our clinic in 2-3 weeks. He was informed of the importance of frequent follow-up visits to maximize his success with intensive lifestyle modifications for his multiple health conditions.   Objective:   Blood pressure 120/74, pulse (!) 51, temperature 98.2 F (36.8 C), temperature source Oral, height 5' 9"  (1.753 m), weight 268 lb (121.6 kg), SpO2 96 %. Body mass index is 39.58 kg/m.  General: Cooperative, alert, well developed, in no acute distress. HEENT: Conjunctivae and lids unremarkable. Cardiovascular: Regular rhythm.  Lungs: Normal work of breathing. Neurologic: No focal deficits.   Lab Results  Component Value Date   CREATININE 1.35 (H) 08/07/2020   BUN 25 (H) 08/07/2020   NA 137 08/07/2020   K 3.9 08/07/2020   CL 103 08/07/2020   CO2 21 (L) 08/07/2020   Lab Results  Component Value Date   ALT 21 11/16/2019   AST 18 11/16/2019   ALKPHOS 89 11/16/2019   BILITOT 0.3 11/16/2019   Lab Results  Component Value Date   HGBA1C 5.2 11/16/2019   Lab Results  Component Value Date   INSULIN 35.9 (H) 11/16/2019   Lab Results  Component Value Date   TSH 2.410 11/16/2019  Lab Results  Component Value Date   CHOL 136 11/16/2019   HDL 35 (L) 11/16/2019   LDLCALC 81 11/16/2019   TRIG 105 11/16/2019   CHOLHDL 3 06/12/2019   Lab Results  Component Value Date   WBC 4.5 08/07/2020   HGB 15.2 08/07/2020   HCT 44.2 08/07/2020   MCV 85.8 08/07/2020   PLT 180 08/07/2020   Attestation Statements:   Reviewed by clinician on day of visit: allergies, medications, problem list,  medical history, surgical history, family history, social history, and previous encounter notes.  I, Water quality scientist, CMA, am acting as transcriptionist for Briscoe Deutscher, DO  I have reviewed the above documentation for accuracy and completeness, and I agree with the above. Briscoe Deutscher, DO

## 2020-09-23 DIAGNOSIS — E8881 Metabolic syndrome: Secondary | ICD-10-CM | POA: Insufficient documentation

## 2020-09-25 ENCOUNTER — Encounter: Payer: Self-pay | Admitting: Family Medicine

## 2020-09-25 ENCOUNTER — Other Ambulatory Visit: Payer: Self-pay

## 2020-09-25 ENCOUNTER — Telehealth (INDEPENDENT_AMBULATORY_CARE_PROVIDER_SITE_OTHER): Payer: No Typology Code available for payment source | Admitting: Family Medicine

## 2020-09-25 VITALS — Ht 69.0 in | Wt 263.0 lb

## 2020-09-25 DIAGNOSIS — R197 Diarrhea, unspecified: Secondary | ICD-10-CM

## 2020-09-25 IMAGING — CT CT HEART MORP W/ CTA COR W/ SCORE W/ CA W/CM &/OR W/O CM
4 of 7 series · 8 of 20 positions shown, 9 images · IV contrast (APPLIED)
Comparison: None.

Addendum:
EXAM:
OVER-READ INTERPRETATION  CT CHEST

The following report is an over-read performed by radiologist Dr.
Gargi Tiger [[email or web address]] on 12/14/2018. This
over-read does not include interpretation of cardiac or coronary
anatomy or pathology. The coronary CTA interpretation by the
cardiologist is attached.
CLINICAL DATA: Chest pain
Cardiac CTA
MEDICATIONS:
Sub lingual nitro. 4 mg and lopressor 10mg
TECHNIQUE: The patient was scanned on a Siemens Force 192 scanner. Gantry
rotation speed was 250 msecs. Collimation was. 6 mm . A 120 kV
prospective scan was triggered in the ascending thoracic aorta at
140 HU's with full mA between 30-70% of the R-R interval . Average
HR during the scan was 60 bpm. The 3D data set was interpreted on a
dedicated work station using MPR, MIP and VRT modes. A total of 80
cc of contrast was used.

[Series 6: best diast 76 % · axial · 0.42mm/px · z∈[+1282,+1327]mm · 2 of 333 slices shown, 3 images]
[im 111/333  vessel]
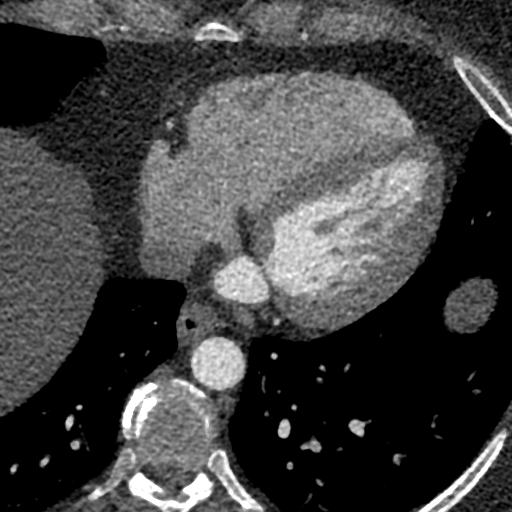
[im 111/333  lung]
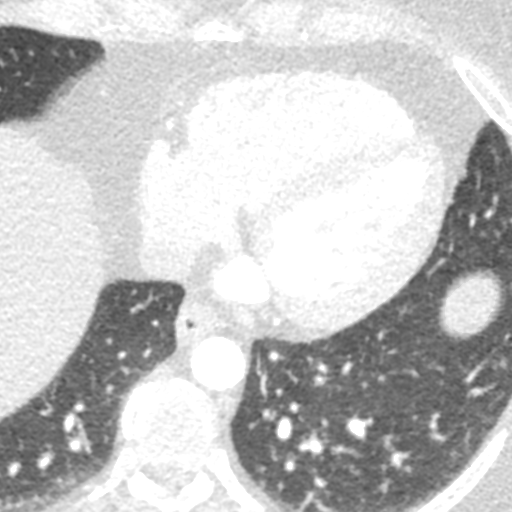
[im 222/333  vessel]
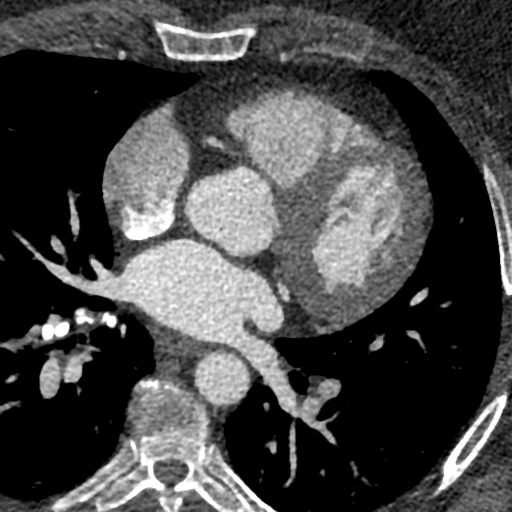

[Series 7: best syst 41 % · axial · 0.42mm/px · z∈[+1282,+1327]mm · 2 of 333 slices shown]
[im 111/333  vessel]
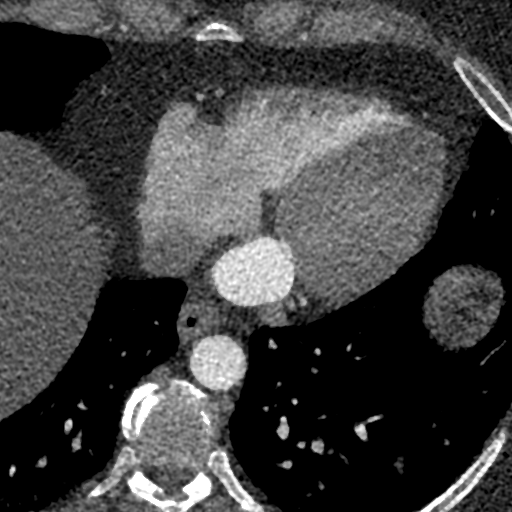
[im 222/333  vessel]
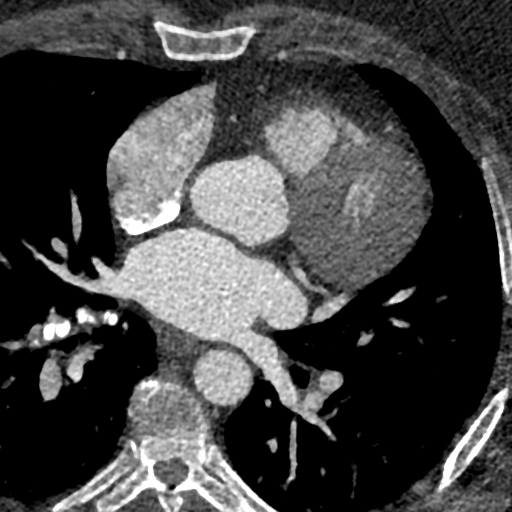

[Series 8: ts diast sharp 76 % · axial · 0.42mm/px · z∈[+1282,+1327]mm · 2 of 333 slices shown]
[im 111/333  lung]
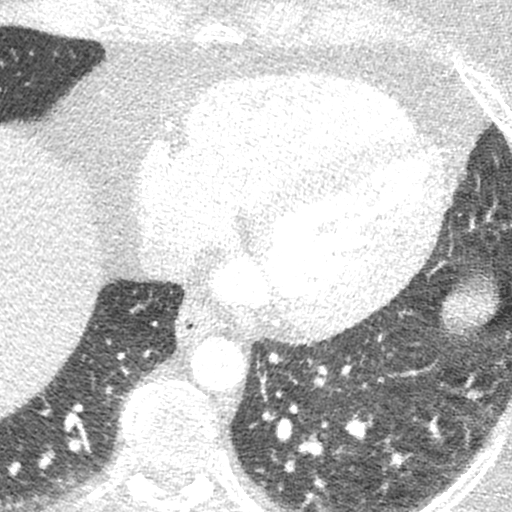
[im 222/333  lung]
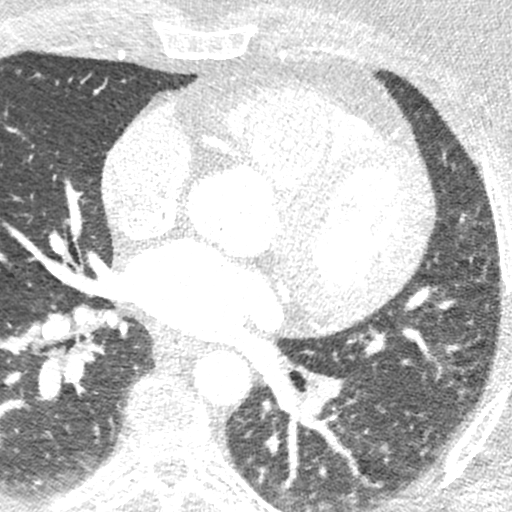

[Series 9: ts syst sharp 41 % · axial · 0.42mm/px · z∈[+1282,+1327]mm · 2 of 333 slices shown]
[im 111/333  lung]
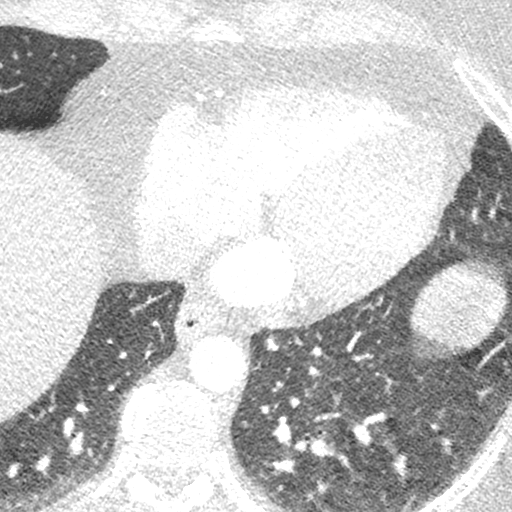
[im 222/333  lung]
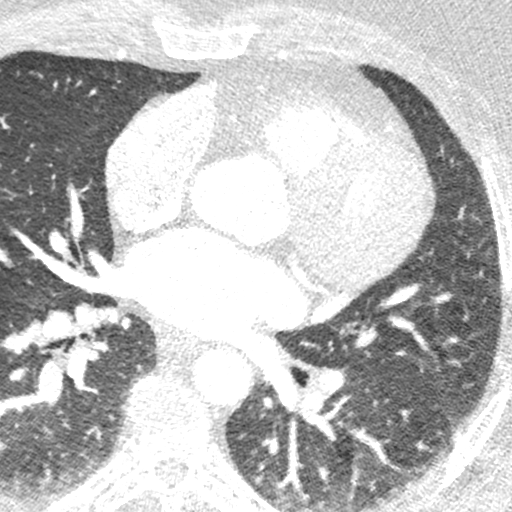

[8 of 20 positions shown; findings below may reference images not displayed]

FINDINGS: Vascular: Heart is normal size.  Visualized aorta normal caliber.

Mediastinum/Nodes: Calcified subcarinal and right hilar lymph nodes
compatible with old granulomatous disease. No adenopathy.

Lungs/Pleura: Calcified right lower lobe granuloma. No confluent
opacities or effusions.

Upper Abdomen: Imaging into the upper abdomen shows no acute
findings.

Musculoskeletal: Chest wall soft tissues are unremarkable. No acute
bony abnormality.
IMPRESSION: Old granulomatous disease.  No acute extra cardiac abnormality.
FINDINGS: Non-cardiac: See separate report from [REDACTED]. No
significant findings on limited lung and soft tissue windows.

Calcium score: Calcium noted isolated to proximal LAD

Coronary Arteries: Right dominant with no anomalies

LM: Normal

LAD: Less than 50% tubular calcific stenosis

D1: Normal

D2: Normal

Circumflex: Normal

OM1: Normal

OM2: Normal

OM3: Normal

RCA: Normal

PDA: Normal

PLA:  Normal
IMPRESSION: 1.  Non obstructive CAD in proximal LAD

2.  Normal aortic root 3.2 cm

3. Calcium score 70 isolated to proximal LAD this is 91 st
percentile for age and sex

Tinisha Ayub

*** End of Addendum ***

## 2020-09-25 NOTE — Progress Notes (Signed)
Patient ID: Hector [name], male   DOB: 10-10-1971, 49 y.o.   MRN: [id number]   This visit type was conducted due to national recommendations for restrictions regarding the COVID-19 pandemic in an effort to limit this patient's exposure and mitigate transmission in our community.   Virtual Visit via Video Note  I connected with Hector [name] on 09/25/20 at  5:15 PM EDT by a video enabled telemedicine application and verified that I am speaking with the correct person using two identifiers.  Location patient: home Location provider:work or home office Persons participating in the virtual visit: patient, provider  I discussed the limitations of evaluation and management by telemedicine and the availability of in person appointments. The patient expressed understanding and agreed to proceed.   HPI:  Hector [name] was seen via virtual visit with 9-day history of diarrhea stools.  He states he is having about 6-7 watery nonbloody stools per day.  No recent antibiotics.  No nausea or vomiting.  Some diffuse abdominal cramping.  He states he is having frequent belching.  Has taken Pepto-Bismol with minimal relief.  No sick contacts.  No history of lactose intolerance.  No history of gluten intolerance.  He has had tendencies toward diarrhea in the past and has been diagnosed with IBS.  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  He has been started on new medications recently including Brilinta and Wegovy (semaglutide) which can both cause diarrhea and he also had increase of Lipitor from 40 to 80 mg daily   ROS: See pertinent positives and negatives per HPI.  Past Medical History:  Diagnosis Date  . Alcohol abuse   . Allergy   . Anal fissure   . Anxiety   . Asthma   . Back pain   . Bilateral swelling of feet   . Blood in stool   . Chest pain   . Depression   . Drug use   . Febrile seizure ([place])   .  Heartburn   . Hyperlipidemia   . Hypertension   . Irritable bowel syndrome with diarrhea   . Jaundice, newborn   . Palpitations   . Panic   . Panic disorder   . Pre-diabetes   . Seizures (HCC)    febrile  . Shortness of breath   . Sleep apnea     Past Surgical History:  Procedure Laterality Date  . CORONARY STENT INTERVENTION N/A 08/19/2020   Procedure: CORONARY STENT INTERVENTION;  Surgeon: Jettie Booze, MD;  Location: [place] [facility];  Service: Cardiovascular;  Laterality: N/A;  . HEMORRHOID BANDING    . INTRAVASCULAR ULTRASOUND/IVUS N/A 08/19/2020   Procedure: Intravascular Ultrasound/IVUS;  Surgeon: Jettie Booze, MD;  Location: [place] [facility];  Service: Cardiovascular;  Laterality: N/A;  . LEFT HEART CATH AND CORONARY ANGIOGRAPHY N/A 08/19/2020   Procedure: LEFT HEART CATH AND CORONARY ANGIOGRAPHY;  Surgeon: Jettie Booze, MD;  Location: [place] [facility];  Service: Cardiovascular;  Laterality: N/A;  . WISDOM TOOTH EXTRACTION  1991    Family History  Problem Relation Age of Onset  . Dementia Mother   . Stroke Mother   . Hypertension Mother   . Skin cancer Mother   . Basal cell carcinoma Mother   . Squamous cell carcinoma Mother   . Cancer Mother   . Depression Mother   . Diabetes Father   . Hyperlipidemia Father   .  Stroke Father   . Heart disease Father 40  . Colon polyps Father   . Heart attack Father   . Hypertension Father   . Depression Father   . Sleep apnea Father   . Obesity Father   . Colitis Brother   . Colon polyps Brother     SOCIAL HX: non-smoker   Current Outpatient Medications:  .  aspirin EC 81 MG tablet, Take 1 tablet (81 mg total) by mouth daily. Swallow whole., Disp: 150 tablet, Rfl: 2 .  atorvastatin (LIPITOR) 80 MG tablet, Take 1 tablet (80 mg total) by mouth daily., Disp: 90 tablet, Rfl: 3 .  FLUoxetine (PROZAC) 10 MG capsule, TAKE 1 CAPSULE DAILY, Disp: 90 capsule, Rfl: 3 .  LORazepam (ATIVAN) 1 MG  tablet, Take 1 tablet (1 mg total) by mouth every 8 (eight) hours as needed for anxiety., Disp: 30 tablet, Rfl: 0 .  losartan (COZAAR) 50 MG tablet, Take 1 tablet (50 mg total) by mouth daily., Disp: 90 tablet, Rfl: 3 .  nebivolol (BYSTOLIC) 10 MG tablet, TAKE 1 TABLET BY MOUTH ONCE DAILY, Disp: 90 tablet, Rfl: 3 .  Semaglutide-Weight Management (WEGOVY) 0.25 MG/0.5ML SOAJ, Inject 0.25 mg into the skin once a week., Disp: 2 mL, Rfl: 0 .  ticagrelor (BRILINTA) 90 MG TABS tablet, Take 1 tablet (90 mg total) by mouth 2 (two) times daily., Disp: 180 tablet, Rfl: 3 .  Vitamin D, Ergocalciferol, (DRISDOL) 1.25 MG (50000 UNIT) CAPS capsule, Take 1 capsule (50,000 Units total) by mouth every 7 (seven) days., Disp: 4 capsule, Rfl: 0  EXAM:  VITALS per patient if applicable:  GENERAL: alert, oriented, appears well and in no acute distress  HEENT: atraumatic, conjunttiva clear, no obvious abnormalities on inspection of external nose and ears  NECK: normal movements of the head and neck  LUNGS: on inspection no signs of respiratory distress, breathing rate appears normal, no obvious gross SOB, gasping or wheezing  CV: no obvious cyanosis  MS: moves all visible extremities without noticeable abnormality  PSYCH/NEURO: pleasant and cooperative, no obvious depression or anxiety, speech and thought processing grossly intact  ASSESSMENT AND PLAN:  Discussed the following assessment and plan:  Diarrhea, unspecified type - Plan: Gastrointestinal Pathogen Panel PCR, Basic metabolic panel  He has history of reported IBS and has had similar flareups in the past.  Does not have any recent antibiotic use to suggest likely C. difficile.  Denies any red flags such as bloody stools or fever.  No recent travels.  Question small intestinal bacterial overgrowth  -He will try some Imodium overnight -We wrote future labs for basic metabolic panel and GI pathogen panel if not improving over the next couple of  days -If GI pathogen panel negative and symptoms persist consider trial of antibiotic for small intestinal bacterial overgrowth which has worked for him previously   I discussed the assessment and treatment plan with the patient. The patient was provided an opportunity to ask questions and all were answered. The patient agreed with the plan and demonstrated an understanding of the instructions.   The patient was advised to call back or seek an in-person evaluation if the symptoms worsen or if the condition fails to improve as anticipated.     Carolann Littler, MD

## 2020-09-26 ENCOUNTER — Other Ambulatory Visit: Payer: Self-pay

## 2020-09-26 ENCOUNTER — Other Ambulatory Visit: Payer: No Typology Code available for payment source

## 2020-09-26 DIAGNOSIS — R197 Diarrhea, unspecified: Secondary | ICD-10-CM

## 2020-09-27 LAB — BASIC METABOLIC PANEL
BUN: 22 mg/dL (ref 7–25)
CO2: 19 mmol/L — ABNORMAL LOW (ref 20–32)
Calcium: 9.2 mg/dL (ref 8.6–10.3)
Chloride: 112 mmol/L — ABNORMAL HIGH (ref 98–110)
Creat: 1.32 mg/dL (ref 0.60–1.35)
Glucose, Bld: 120 mg/dL — ABNORMAL HIGH (ref 65–99)
Potassium: 4.2 mmol/L (ref 3.5–5.3)
Sodium: 142 mmol/L (ref 135–146)

## 2020-09-30 ENCOUNTER — Encounter: Payer: Self-pay | Admitting: Family Medicine

## 2020-09-30 LAB — GASTROINTESTINAL PATHOGEN PANEL PCR

## 2020-10-08 ENCOUNTER — Ambulatory Visit (INDEPENDENT_AMBULATORY_CARE_PROVIDER_SITE_OTHER): Payer: No Typology Code available for payment source | Admitting: Family Medicine

## 2020-10-08 ENCOUNTER — Other Ambulatory Visit: Payer: Self-pay

## 2020-10-08 ENCOUNTER — Encounter (INDEPENDENT_AMBULATORY_CARE_PROVIDER_SITE_OTHER): Payer: Self-pay | Admitting: Family Medicine

## 2020-10-08 VITALS — BP 118/73 | HR 62 | Temp 98.3°F | Ht 69.0 in | Wt 272.0 lb

## 2020-10-08 DIAGNOSIS — R197 Diarrhea, unspecified: Secondary | ICD-10-CM

## 2020-10-08 DIAGNOSIS — I25119 Atherosclerotic heart disease of native coronary artery with unspecified angina pectoris: Secondary | ICD-10-CM | POA: Diagnosis not present

## 2020-10-08 DIAGNOSIS — E559 Vitamin D deficiency, unspecified: Secondary | ICD-10-CM | POA: Diagnosis not present

## 2020-10-08 DIAGNOSIS — R632 Polyphagia: Secondary | ICD-10-CM | POA: Diagnosis not present

## 2020-10-08 DIAGNOSIS — Z6841 Body Mass Index (BMI) 40.0 and over, adult: Secondary | ICD-10-CM

## 2020-10-09 NOTE — Progress Notes (Signed)
Chief Complaint:   OBESITY Hector [name] is here to discuss his progress with his obesity treatment plan along with follow-up of his obesity related diagnoses. Hector [name] is on the Category 2 Plan and states he is following his eating plan approximately 30% of the time. Hector [name] states he is exercising for 0 minutes 0 times per week.  Today's visit was #: 15 Starting weight: 282 lbs Starting date: 01/30/2020 Today's weight: 272 lbs Today's date: 10/08/2020 Total lbs lost to date: 10 lbs Total lbs lost since last in-office visit: +4 lbs Total weight loss percentage to date: -3.55%  Interim History: Hector [name] says she has had diarrhea for 16 days.  It started 2 days prior to her first [facility] injection.  On day 5 and 6, she saw her PCP.  She had LLQ pain, burning.  Today's bioimpedance results indicate that Hector [name] has gained 9 pounds of water weight since his last visit.  Assessment/Plan:   1. Vitamin D deficiency Current vitamin D is 17.8, tested on 11/16/2019. Not at goal. Optimal goal > 50 ng/dL. =  Plan: Continue Vitamin D @50 ,000 IU every week with follow-up for routine testing of Vitamin D at least 2-3 times per year to avoid over-replacement.  2. Coronary artery disease with angina pectoris, unspecified vessel or lesion type, unspecified whether native or transplanted heart [facility]) Status post angioplasty with stent on 08/19/2020. He will start Cardiac Rehab next month. I've encouraged him to take advantage of it.   3. Polyphagia Hyperphagia, also called polyphagia, refers to excessive feelings of hunger. This is more likely to be an issues for people that have diabetes, prediabetes, or insulin resistance. He will continue to focus on protein-rich, low simple carbohydrate foods. We reviewed the importance of hydration, regular exercise for stress reduction, and restorative sleep.  He will restart Wegovy.  4. Diarrhea Resolved. We will continue to monitor symptoms as they relate to his weight loss  journey.  5. Class 3 severe obesity with serious comorbidity and body mass index (BMI) of 40.0 to 44.9 in adult, unspecified obesity type (HCC)  Hector [name] is currently in the action stage of change. As such, his goal is to continue with weight loss efforts. He has agreed to the Category 2 Plan.   Exercise goals: Cardiac rehab starting next week.  Behavioral modification strategies: increasing lean protein intake, decreasing simple carbohydrates, increasing vegetables and increasing water intake.  Hector [name] has agreed to follow-up with our clinic in 3 weeks. He was informed of the importance of frequent follow-up visits to maximize his success with intensive lifestyle modifications for his multiple health conditions.   Objective:   Blood pressure 118/73, pulse 62, temperature 98.3 F (36.8 C), temperature source Oral, height 5' 9"  (1.753 m), weight 272 lb (123.4 kg), SpO2 98 %. Body mass index is 40.17 kg/m.  General: Cooperative, alert, well developed, in no acute distress. HEENT: Conjunctivae and lids unremarkable. Cardiovascular: Regular rhythm.  Lungs: Normal work of breathing. Neurologic: No focal deficits.   Lab Results  Component Value Date   CREATININE 1.32 09/26/2020   BUN 22 09/26/2020   NA 142 09/26/2020   K 4.2 09/26/2020   CL 112 (H) 09/26/2020   CO2 19 (L) 09/26/2020   Lab Results  Component Value Date   ALT 21 11/16/2019   AST 18 11/16/2019   ALKPHOS 89 11/16/2019   BILITOT 0.3 11/16/2019   Lab Results  Component Value Date   HGBA1C 5.2 11/16/2019   Lab Results  Component Value  Date   INSULIN 35.9 (H) 11/16/2019   Lab Results  Component Value Date   TSH 2.410 11/16/2019   Lab Results  Component Value Date   CHOL 136 11/16/2019   HDL 35 (L) 11/16/2019   LDLCALC 81 11/16/2019   TRIG 105 11/16/2019   CHOLHDL 3 06/12/2019   Lab Results  Component Value Date   WBC 4.5 08/07/2020   HGB 15.2 08/07/2020   HCT 44.2 08/07/2020   MCV 85.8 08/07/2020   PLT  180 08/07/2020   Attestation Statements:   Reviewed by clinician on day of visit: allergies, medications, problem list, medical history, surgical history, family history, social history, and previous encounter notes.  I, Water quality scientist, CMA, am acting as transcriptionist for Briscoe Deutscher, DO  I have reviewed the above documentation for accuracy and completeness, and I agree with the above. Briscoe Deutscher, DO

## 2020-10-11 ENCOUNTER — Telehealth (HOSPITAL_COMMUNITY): Payer: Self-pay

## 2020-10-11 NOTE — Telephone Encounter (Signed)
Cardiac Rehab Medication Review by a Pharmacist  Does the patient  feel that his/her medications are working for him/her?  yes  Has the patient been experiencing any side effects to the medications prescribed?  no  Does the patient measure his/her own blood pressure or blood glucose at home?  no   Does the patient have any problems obtaining medications due to transportation or finances?   no  Understanding of regimen: excellent Understanding of indications: excellent Potential of compliance: excellent    Pharmacist Intervention: n/a    Dimple Nanas, PharmD PGY-1 Acute Care Pharmacy Resident Office: [phone]  10/11/2020 2:22 PM

## 2020-10-14 ENCOUNTER — Telehealth (HOSPITAL_COMMUNITY): Payer: Self-pay | Admitting: *Deleted

## 2020-10-14 NOTE — Telephone Encounter (Signed)
Left message to call cardiac rehab

## 2020-10-15 ENCOUNTER — Other Ambulatory Visit: Payer: Self-pay

## 2020-10-15 ENCOUNTER — Encounter (HOSPITAL_COMMUNITY)
Admission: RE | Admit: 2020-10-15 | Discharge: 2020-10-15 | Disposition: A | Discharge status: Home / Self Care | Payer: No Typology Code available for payment source | Source: Ambulatory Visit | Attending: Interventional Cardiology | Admitting: Interventional Cardiology

## 2020-10-15 DIAGNOSIS — Z955 Presence of coronary angioplasty implant and graft: Secondary | ICD-10-CM | POA: Insufficient documentation

## 2020-10-15 NOTE — Progress Notes (Signed)
Incomplete Session Note  Patient Details  Name: SAHARSH STERLING MRN: [id number] Date of Birth: 27-Nov-1971 Referring Provider:    [name] did not complete his rehab session.  Maurilio reported having diarrhea due to eating spicy food. Will not proceed with orientation per protocol due to symptoms. Daemyn's orientation/walk test has been rescheduled to Thursday 10/17/20. Patient knows to be symptom free before returning to cardiac rehab. Patient states understanding.[name], RN,BSN 10/15/2020 11:46 AM

## 2020-10-17 ENCOUNTER — Encounter (HOSPITAL_COMMUNITY)
Admission: RE | Admit: 2020-10-17 | Discharge: 2020-10-17 | Disposition: A | Discharge status: Home / Self Care | Payer: No Typology Code available for payment source | Source: Ambulatory Visit | Attending: Interventional Cardiology | Admitting: Interventional Cardiology

## 2020-10-17 ENCOUNTER — Encounter (HOSPITAL_COMMUNITY): Payer: Self-pay

## 2020-10-17 ENCOUNTER — Other Ambulatory Visit: Payer: Self-pay

## 2020-10-17 VITALS — BP 118/84 | Ht 69.5 in | Wt 272.7 lb

## 2020-10-17 DIAGNOSIS — Z955 Presence of coronary angioplasty implant and graft: Secondary | ICD-10-CM

## 2020-10-17 HISTORY — DX: Atherosclerotic heart disease of native coronary artery without angina pectoris: I25.10

## 2020-10-17 NOTE — Progress Notes (Addendum)
Cardiac Individual Treatment Plan  Patient Details  Name: Hector [name] MRN: [id number] Date of Birth: 02/03/1971 Referring Provider:     [place] from 10/17/2020 in [place]  Referring Provider Gerrit Halls, MD      Initial Encounter Date:    CARDIAC REHAB PHASE II ORIENTATION from 10/17/2020 in [place]  Date 10/17/20      Visit Diagnosis: S/P drug eluting coronary stent placement 08/19/20  Patient's Home Medications on Admission:  Current Outpatient Medications:  .  aspirin EC 81 MG tablet, Take 1 tablet (81 mg total) by mouth daily. Swallow whole., Disp: 150 tablet, Rfl: 2 .  atorvastatin (LIPITOR) 80 MG tablet, Take 1 tablet (80 mg total) by mouth daily., Disp: 90 tablet, Rfl: 3 .  FLUoxetine (PROZAC) 10 MG capsule, TAKE 1 CAPSULE DAILY, Disp: 90 capsule, Rfl: 3 .  LORazepam (ATIVAN) 1 MG tablet, Take 1 tablet (1 mg total) by mouth every 8 (eight) hours as needed for anxiety., Disp: 30 tablet, Rfl: 0 .  losartan (COZAAR) 50 MG tablet, Take 1 tablet (50 mg total) by mouth daily., Disp: 90 tablet, Rfl: 3 .  nebivolol (BYSTOLIC) 10 MG tablet, TAKE 1 TABLET BY MOUTH ONCE DAILY, Disp: 90 tablet, Rfl: 3 .  Semaglutide-Weight Management (WEGOVY) 0.25 MG/0.5ML SOAJ, Inject 0.25 mg into the skin once a week., Disp: 2 mL, Rfl: 0 .  ticagrelor (BRILINTA) 90 MG TABS tablet, Take 1 tablet (90 mg total) by mouth 2 (two) times daily., Disp: 180 tablet, Rfl: 3 .  Vitamin D, Ergocalciferol, (DRISDOL) 1.25 MG (50000 UNIT) CAPS capsule, Take 1 capsule (50,000 Units total) by mouth every 7 (seven) days. (Patient not taking: Reported on 10/11/2020), Disp: 4 capsule, Rfl: 0  Past Medical History: Past Medical History:  Diagnosis Date  . Alcohol abuse   . Allergy   . Anal fissure   . Anxiety   . Asthma   . Back pain   . Bilateral swelling of feet   . Blood in stool   . Chest pain   . Depression     . Drug use   . Febrile seizure ([place])   . Heartburn   . Hyperlipidemia   . Hypertension   . Irritable bowel syndrome with diarrhea   . Jaundice, newborn   . Palpitations   . Panic   . Panic disorder   . Pre-diabetes   . Seizures ([facility])    febrile  . Shortness of breath   . Sleep apnea     Tobacco Use: Social History   Tobacco Use  Smoking Status Never Smoker  Smokeless Tobacco Never Used    Labs: Recent Review Flowsheet Data    Labs for ITP Cardiac and Pulmonary Rehab Latest Ref Rng & Units 01/17/2014 03/13/2015 04/13/2018 06/12/2019 11/16/2019   Cholestrol 100 - 199 mg/dL 175 195 186 103 136   LDLCALC 0 - 99 mg/dL 117(H) 133(H) 114(H) 54 81   HDL >39 mg/dL 32.20(L) 34.20(L) 33.70(L) 37.40(L) 35(L)   Trlycerides 0 - 149 mg/dL 131.0 138.0 195.0(H) 61.0 105   Hemoglobin A1c 4.8 - 5.6 % - - - - 5.2      Capillary Blood Glucose: No results found for: GLUCAP   Exercise Target Goals: Exercise Program Goal: Individual exercise prescription set using results from initial 6 min walk test and THRR while considering  patient's activity barriers and safety.   Exercise Prescription Goal: Starting with aerobic activity  30 plus minutes a day, 3 days per week for initial exercise prescription. Provide home exercise prescription and guidelines that participant acknowledges understanding prior to discharge.  Activity Barriers & Risk Stratification:  Activity Barriers & Cardiac Risk Stratification - 10/17/20 1122      Activity Barriers & Cardiac Risk Stratification   Activity Barriers History of Falls;Deconditioning    Cardiac Risk Stratification High           6 Minute Walk:  6 Minute Walk    Row Name 10/17/20 1022         6 Minute Walk   Phase Initial     Distance 1200 feet     Walk Time 6 minutes     # of Rest Breaks 0     MPH 2.27     METS 3.51     RPE 11     Perceived Dyspnea  0     VO2 Peak 12.3     Resting HR 62 bpm     Resting BP 118/84     Resting Oxygen  Saturation  97 %     Exercise Oxygen Saturation  during 6 min walk 99 %     Max Ex. HR 81 bpm     Max Ex. BP 134/82     2 Minute Post BP 110/64            Oxygen Initial Assessment:   Oxygen Re-Evaluation:   Oxygen Discharge (Final Oxygen Re-Evaluation):   Initial Exercise Prescription:  Initial Exercise Prescription - 10/17/20 1100      Date of Initial Exercise RX and Referring Provider   Date 10/17/20    Referring Provider Gerrit Halls, MD    Expected Discharge Date 12/13/20      NuStep   Level 2    SPM 85    Minutes 15    METs 2      Track   Laps 10    Minutes 15    METs 2.16      Prescription Details   Frequency (times per week) 3    Duration Progress to 30 minutes of continuous aerobic without signs/symptoms of physical distress      Intensity   THRR 40-80% of Max Heartrate 68-137    Ratings of Perceived Exertion 11-13    Perceived Dyspnea 0-4      Progression   Progression Continue progressive overload as per policy without signs/symptoms or physical distress.      Resistance Training   Training Prescription Yes    Weight 3 lbs    Reps 10-15           Perform Capillary Blood Glucose checks as needed.  Exercise Prescription Changes:   Exercise Comments:   Exercise Goals and Review:   Exercise Goals    Row Name 10/17/20 1124             Exercise Goals   Increase Physical Activity Yes       Intervention Provide advice, education, support and counseling about physical activity/exercise needs.;Develop an individualized exercise prescription for aerobic and resistive training based on initial evaluation findings, risk stratification, comorbidities and participant's personal goals.       Expected Outcomes Short Term: Attend rehab on a regular basis to increase amount of physical activity.;Long Term: Add in home exercise to make exercise part of routine and to increase amount of physical activity.;Long Term: Exercising regularly at  least 3-5 days a week.  Increase Strength and Stamina Yes       Intervention Provide advice, education, support and counseling about physical activity/exercise needs.;Develop an individualized exercise prescription for aerobic and resistive training based on initial evaluation findings, risk stratification, comorbidities and participant's personal goals.       Expected Outcomes Short Term: Increase workloads from initial exercise prescription for resistance, speed, and METs.;Long Term: Improve cardiorespiratory fitness, muscular endurance and strength as measured by increased METs and functional capacity (6MWT);Short Term: Perform resistance training exercises routinely during rehab and add in resistance training at home       Able to understand and use rate of perceived exertion (RPE) scale Yes       Intervention Provide education and explanation on how to use RPE scale       Expected Outcomes Short Term: Able to use RPE daily in rehab to express subjective intensity level;Long Term:  Able to use RPE to guide intensity level when exercising independently       Knowledge and understanding of Target Heart Rate Range (THRR) Yes       Intervention Provide education and explanation of THRR including how the numbers were predicted and where they are located for reference       Expected Outcomes Short Term: Able to state/look up THRR;Short Term: Able to use daily as guideline for intensity in rehab;Long Term: Able to use THRR to govern intensity when exercising independently       Able to check pulse independently Yes       Intervention Provide education and demonstration on how to check pulse in carotid and radial arteries.;Review the importance of being able to check your own pulse for safety during independent exercise       Expected Outcomes Short Term: Able to explain why pulse checking is important during independent exercise;Long Term: Able to check pulse independently and accurately        Understanding of Exercise Prescription Yes       Intervention Provide education, explanation, and written materials on patient's individual exercise prescription       Expected Outcomes Short Term: Able to explain program exercise prescription;Long Term: Able to explain home exercise prescription to exercise independently              Exercise Goals Re-Evaluation :    Discharge Exercise Prescription (Final Exercise Prescription Changes):   Nutrition:  Target Goals: Understanding of nutrition guidelines, daily intake of sodium <1514m, cholesterol <2016m calories 30% from fat and 7% or less from saturated fats, daily to have 5 or more servings of fruits and vegetables.  Biometrics:  Pre Biometrics - 10/17/20 0915      Pre Biometrics   Waist Circumference 54.25 inches    Hip Circumference 51 inches    Waist to Hip Ratio 1.06 %    Triceps Skinfold 22 mm    % Body Fat 39.6 %    Grip Strength 42 kg    Flexibility 0 in   Pt could not reach box   Single Leg Stand 9.75 seconds            Nutrition Therapy Plan and Nutrition Goals:   Nutrition Assessments:   Nutrition Goals Re-Evaluation:   Nutrition Goals Discharge (Final Nutrition Goals Re-Evaluation):   Psychosocial: Target Goals: Acknowledge presence or absence of significant depression and/or stress, maximize coping skills, provide positive support system. Participant is able to verbalize types and ability to use techniques and skills needed for reducing stress and depression.  Initial  Review & Psychosocial Screening:  Initial Psych Review & Screening - 10/17/20 1148      Initial Review   Current issues with History of Depression;Current Depression;Current Stress Concerns    Source of Stress Concerns Chronic Illness;Family;Occupation    Hector [name] is currently going through a divorce. Hector [name] shares custody with his wife. Hector [name] is currently depressed he is taking an antidepressant which somewhat controlls his  depression      Family Dynamics   Good Support System? Yes   Hector [name] lives alone. Hector [name] has a sister who lives near by who he has for support     Barriers   Psychosocial barriers to participate in program The patient should benefit from training in stress management and relaxation.      Screening Interventions   Interventions To provide support and resources with identified psychosocial needs;Encouraged to exercise;Provide feedback about the scores to participant   Will forward to his quality of life questionniare to his wellness or primary care physician   Expected Outcomes Short Term goal: Utilizing psychosocial counselor, staff and physician to assist with identification of specific Stressors or current issues interfering with healing process. Setting desired goal for each stressor or current issue identified.;Long Term Goal: Stressors or current issues are controlled or eliminated.;Short Term goal: Identification and review with participant of any Quality of Life or Depression concerns found by scoring the questionnaire.;Long Term goal: The participant improves quality of Life and PHQ9 Scores as seen by post scores and/or verbalization of changes           Quality of Life Scores:  Quality of Life - 10/17/20 1112      Quality of Life   Select Quality of Life      Quality of Life Scores   Health/Function Pre 18.23 %    Socioeconomic Pre 18.86 %    Psych/Spiritual Pre 12.64 %    Family Pre 16.88 %    GLOBAL Pre 17.02 %          Scores of 19 and below usually indicate a poorer quality of life in these areas.  A difference of  2-3 points is a clinically meaningful difference.  A difference of 2-3 points in the total score of the Quality of Life Index has been associated with significant improvement in overall quality of life, self-image, physical symptoms, and general health in studies assessing change in quality of life.  PHQ-9: Recent Review Flowsheet Data    Depression screen [facility]  2/9 10/17/2020 11/16/2019   Decreased Interest 1 1   Down, Depressed, Hopeless 1 1   PHQ - 2 Score 2 2   Altered sleeping - 3   Tired, decreased energy - 3   Change in appetite - 2   Feeling bad or failure about yourself  - 3   Trouble concentrating - 1   Moving slowly or fidgety/restless - 1   Suicidal thoughts - 0   PHQ-9 Score - 15   Difficult doing work/chores - Somewhat difficult     Interpretation of Total Score  Total Score Depression Severity:  1-4 = Minimal depression, 5-9 = Mild depression, 10-14 = Moderate depression, 15-19 = Moderately severe depression, 20-27 = Severe depression   Psychosocial Evaluation and Intervention:   Psychosocial Re-Evaluation:   Psychosocial Discharge (Final Psychosocial Re-Evaluation):   Vocational Rehabilitation: Provide vocational rehab assistance to qualifying candidates.   Vocational Rehab Evaluation & Intervention:  Vocational Rehab - 10/17/20 1157      Initial Vocational Rehab Evaluation &  Intervention   Assessment shows need for Vocational Rehabilitation No   Hector [name] is currently working and does not need vocational rehab at this time          Education: Education Goals: Education classes will be provided on a weekly basis, covering required topics. Participant will state understanding/return demonstration of topics presented.  Learning Barriers/Preferences:  Learning Barriers/Preferences - 10/17/20 1116      Learning Barriers/Preferences   Learning Barriers Sight;Hearing   wears glasses   Learning Preferences Computer/Internet;Pictoral;Skilled Demonstration           Education Topics: Hypertension, Hypertension Reduction -Define heart disease and high blood pressure. Discus how high blood pressure affects the body and ways to reduce high blood pressure.   Exercise and Your Heart -Discuss why it is important to exercise, the FITT principles of exercise, normal and abnormal responses to exercise, and how to  exercise safely.   Angina -Discuss definition of angina, causes of angina, treatment of angina, and how to decrease risk of having angina.   Cardiac Medications -Review what the following cardiac medications are used for, how they affect the body, and side effects that may occur when taking the medications.  Medications include Aspirin, Beta blockers, calcium channel blockers, ACE Inhibitors, angiotensin receptor blockers, diuretics, digoxin, and antihyperlipidemics.   Congestive Heart Failure -Discuss the definition of CHF, how to live with CHF, the signs and symptoms of CHF, and how keep track of weight and sodium intake.   Heart Disease and Intimacy -Discus the effect sexual activity has on the heart, how changes occur during intimacy as we age, and safety during sexual activity.   Smoking Cessation / COPD -Discuss different methods to quit smoking, the health benefits of quitting smoking, and the definition of COPD.   Nutrition I: Fats -Discuss the types of cholesterol, what cholesterol does to the heart, and how cholesterol levels can be controlled.   Nutrition II: Labels -Discuss the different components of food labels and how to read food label   Heart Parts/Heart Disease and PAD -Discuss the anatomy of the heart, the pathway of blood circulation through the heart, and these are affected by heart disease.   Stress I: Signs and Symptoms -Discuss the causes of stress, how stress may lead to anxiety and depression, and ways to limit stress.   Stress II: Relaxation -Discuss different types of relaxation techniques to limit stress.   Warning Signs of Stroke / TIA -Discuss definition of a stroke, what the signs and symptoms are of a stroke, and how to identify when someone is having stroke.   Knowledge Questionnaire Score:  Knowledge Questionnaire Score - 10/17/20 1115      Knowledge Questionnaire Score   Pre Score 21/24           Core Components/Risk  Factors/Patient Goals at Admission:  Personal Goals and Risk Factors at Admission - 10/17/20 1118      Core Components/Risk Factors/Patient Goals on Admission    Weight Management Yes;Obesity;Weight Loss    Intervention Weight Management: Develop a combined nutrition and exercise program designed to reach desired caloric intake, while maintaining appropriate intake of nutrient and fiber, sodium and fats, and appropriate energy expenditure required for the weight goal.;Weight Management: Provide education and appropriate resources to help participant work on and attain dietary goals.;Weight Management/Obesity: Establish reasonable short term and long term weight goals.;Obesity: Provide education and appropriate resources to help participant work on and attain dietary goals.    Admit Weight 272 lb 11.3 oz (  123.7 kg)    Expected Outcomes Short Term: Continue to assess and modify interventions until short term weight is achieved;Long Term: Adherence to nutrition and physical activity/exercise program aimed toward attainment of established weight goal;Weight Maintenance: Understanding of the daily nutrition guidelines, which includes 25-35% calories from fat, 7% or less cal from saturated fats, less than 268m cholesterol, less than 1.5gm of sodium, & 5 or more servings of fruits and vegetables daily;Weight Loss: Understanding of general recommendations for a balanced deficit meal plan, which promotes 1-2 lb weight loss per week and includes a negative energy balance of [phone] kcal/d;Understanding recommendations for meals to include 15-35% energy as protein, 25-35% energy from fat, 35-60% energy from carbohydrates, less than 2068mof dietary cholesterol, 20-35 gm of total fiber daily;Understanding of distribution of calorie intake throughout the day with the consumption of 4-5 meals/snacks    Hypertension Yes    Intervention Provide education on lifestyle modifcations including regular physical  activity/exercise, weight management, moderate sodium restriction and increased consumption of fresh fruit, vegetables, and low fat dairy, alcohol moderation, and smoking cessation.;Monitor prescription use compliance.    Expected Outcomes Short Term: Continued assessment and intervention until BP is < 140/9028mG in hypertensive participants. < 130/46m44m in hypertensive participants with diabetes, heart failure or chronic kidney disease.;Long Term: Maintenance of blood pressure at goal levels.    Lipids Yes    Intervention Provide education and support for participant on nutrition & aerobic/resistive exercise along with prescribed medications to achieve LDL <70mg18mL >40mg.3mExpected Outcomes Short Term: Participant states understanding of desired cholesterol values and is compliant with medications prescribed. Participant is following exercise prescription and nutrition guidelines.;Long Term: Cholesterol controlled with medications as prescribed, with individualized exercise RX and with personalized nutrition plan. Value goals: LDL < 70mg, 47m> 40 mg.    Stress Yes    Intervention Offer individual and/or small group education and counseling on adjustment to heart disease, stress management and health-related lifestyle change. Teach and support self-help strategies.;Refer participants experiencing significant psychosocial distress to appropriate mental health specialists for further evaluation and treatment. When possible, include family members and significant others in education/counseling sessions.    Expected Outcomes Short Term: Participant demonstrates changes in health-related behavior, relaxation and other stress management skills, ability to obtain effective social support, and compliance with psychotropic medications if prescribed.;Long Term: Emotional wellbeing is indicated by absence of clinically significant psychosocial distress or social isolation.           Core Components/Risk  Factors/Patient Goals Review:    Core Components/Risk Factors/Patient Goals at Discharge (Final Review):    ITP Comments:  ITP Comments    Row Name 10/15/20 1138 10/17/20 1143         ITP Comments Dr Traci TFransico Himdical Director Dr Traci TFransico Himdical Director             CommentLomiraed orientation on 10/17/2020 to review rules and guidelines for program.  Completed 6 minute walk test, Intitial ITP, and exercise prescription.  VSS. Telemetry-Sinus Rhythm. [place] experiencing a sensation in his right rib area briefly at the end of his walk test  otherwise   Asymptomatic. Safety measures and social distancing in place per CDC guidelines.Kelee Cunningham WBarnet PallN 10/17/2020 12:12 PM

## 2020-10-21 ENCOUNTER — Encounter (HOSPITAL_COMMUNITY)
Admission: RE | Admit: 2020-10-21 | Discharge: 2020-10-21 | Disposition: A | Discharge status: Home / Self Care | Payer: No Typology Code available for payment source | Source: Ambulatory Visit | Attending: Interventional Cardiology | Admitting: Interventional Cardiology

## 2020-10-21 ENCOUNTER — Other Ambulatory Visit: Payer: Self-pay

## 2020-10-21 DIAGNOSIS — Z955 Presence of coronary angioplasty implant and graft: Secondary | ICD-10-CM

## 2020-10-21 NOTE — Progress Notes (Signed)
Daily Session Note  Patient Details  Name: Hector [name] MRN: [id number] Date of Birth: [birth year] Referring Provider:     [place] from 10/17/2020 in [place]  Referring Provider Hector Halls, MD      Encounter Date: 10/21/2020  Check In:  Session Check In - 10/21/20 1336      Check-In   Supervising physician immediately available to respond to emergencies [facility] immediately available    Physician(s) Hector Maylene Roes    Location [facility]    Staff Present Hector Rubenstein, MS, EP-C, CCRP;Hector Pro, RN, Hector Pretty, MS, ACSM CEP, Exercise Physiologist;Hector Carol Ada, MS,ACSM CEP, Exercise Physiologist    Virtual Visit No    Medication changes reported     No    Fall or balance concerns reported    No    Tobacco Cessation No Change    Warm-up and Cool-down Performed on first and last piece of equipment    Resistance Training Performed Yes    VAD Patient? No    PAD/SET Patient? No      Pain Assessment   Currently in Pain? No/denies    Pain Score 0-No pain    Multiple Pain Sites No           Capillary Blood Glucose: No results found for this or any previous visit (from the past 24 hour(s)).   Exercise Prescription Changes - 10/21/20 1400      Response to Exercise   Blood Pressure (Admit) 122/82    Blood Pressure (Exercise) 144/78    Blood Pressure (Exit) 112/70    Heart Rate (Admit) 67 bpm    Heart Rate (Exercise) 94 bpm    Heart Rate (Exit) 72 bpm    Rating of Perceived Exertion (Exercise) 11    Symptoms None    Comments Pt's first day of exercise in the CRP2 program.    Duration Progress to 30 minutes of  aerobic without signs/symptoms of physical distress    Intensity THRR unchanged      Progression   Progression Continue to progress workloads to maintain intensity without signs/symptoms of physical distress.    Average METs 2.3      Resistance Training   Training  Prescription Yes    Weight 3 lbs    Reps 10-15    Time 10 Minutes      Interval Training   Interval Training No      NuStep   Level 2    SPM 80    Minutes 15    METs 1.8      Track   Laps 16    Minutes 15    METs 2.86           Social History   Tobacco Use  Smoking Status Never Smoker  Smokeless Tobacco Never Used    Goals Met:  Exercise tolerated well No report of cardiac concerns or symptoms Strength training completed today  Goals Unmet:  Not Applicable  Comments: [name] started cardiac rehab today.  Pt tolerated light exercise without difficulty. VSS, telemetry-Sinus Rhythm, asymptomatic.  Medication list reconciled. Pt denies barriers to medicaiton compliance.  PSYCHOSOCIAL ASSESSMENT:  PHQ-2. Hector [name] has some family stressors.  Pt enjoys *Playing the guitar and the keyboard and watching football.   Pt oriented to exercise equipment and routine.    Understanding verbalized.Hector Pall, RN,BSN 10/22/2020 7:46 AM   Hector. Fransico [name] is Medical Director for Cardiac Rehab at [facility]  Hospital.

## 2020-10-23 ENCOUNTER — Encounter (HOSPITAL_COMMUNITY)
Admission: RE | Admit: 2020-10-23 | Discharge: 2020-10-23 | Disposition: A | Discharge status: Home / Self Care | Payer: No Typology Code available for payment source | Source: Ambulatory Visit | Attending: Interventional Cardiology | Admitting: Interventional Cardiology

## 2020-10-23 ENCOUNTER — Other Ambulatory Visit: Payer: Self-pay

## 2020-10-23 DIAGNOSIS — Z955 Presence of coronary angioplasty implant and graft: Secondary | ICD-10-CM | POA: Diagnosis not present

## 2020-10-23 NOTE — Progress Notes (Signed)
Cardiac Individual Treatment Plan  Patient Details  Name: Hector [name] MRN: [id number] Date of Birth: 05-26-1971 Referring Provider:     [facility] from 10/17/2020 in [facility]  Referring Provider Gerrit Halls, MD      Initial Encounter Date:    CARDIAC REHAB PHASE II ORIENTATION from 10/17/2020 in [facility]  Date 10/17/20      Visit Diagnosis: S/P drug eluting coronary stent placement 08/19/20  Patient's Home Medications on Admission:  Current Outpatient Medications:  .  aspirin EC 81 MG tablet, Take 1 tablet (81 mg total) by mouth daily. Swallow whole., Disp: 150 tablet, Rfl: 2 .  atorvastatin (LIPITOR) 80 MG tablet, Take 1 tablet (80 mg total) by mouth daily., Disp: 90 tablet, Rfl: 3 .  FLUoxetine (PROZAC) 10 MG capsule, TAKE 1 CAPSULE DAILY, Disp: 90 capsule, Rfl: 3 .  LORazepam (ATIVAN) 1 MG tablet, Take 1 tablet (1 mg total) by mouth every 8 (eight) hours as needed for anxiety., Disp: 30 tablet, Rfl: 0 .  losartan (COZAAR) 50 MG tablet, Take 1 tablet (50 mg total) by mouth daily., Disp: 90 tablet, Rfl: 3 .  nebivolol (BYSTOLIC) 10 MG tablet, TAKE 1 TABLET BY MOUTH ONCE DAILY, Disp: 90 tablet, Rfl: 3 .  Semaglutide-Weight Management (WEGOVY) 0.25 MG/0.5ML SOAJ, Inject 0.25 mg into the skin once a week., Disp: 2 mL, Rfl: 0 .  ticagrelor (BRILINTA) 90 MG TABS tablet, Take 1 tablet (90 mg total) by mouth 2 (two) times daily., Disp: 180 tablet, Rfl: 3 .  Vitamin D, Ergocalciferol, (DRISDOL) 1.25 MG (50000 UNIT) CAPS capsule, Take 1 capsule (50,000 Units total) by mouth every 7 (seven) days. (Patient not taking: Reported on 10/11/2020), Disp: 4 capsule, Rfl: 0  Past Medical History: Past Medical History:  Diagnosis Date  . Alcohol abuse   . Allergy   . Anal fissure   . Anxiety   . Asthma   . Back pain   . Bilateral swelling of feet   . Blood in stool   . Chest pain   . Coronary  artery disease   . Depression   . Drug use   . Febrile seizure ([facility])   . Heartburn   . Hyperlipidemia   . Hypertension   . Irritable bowel syndrome with diarrhea   . Jaundice, newborn   . Palpitations   . Panic   . Panic disorder   . Pre-diabetes   . Seizures ([facility])    febrile  . Shortness of breath   . Sleep apnea     Tobacco Use: Social History   Tobacco Use  Smoking Status Never Smoker  Smokeless Tobacco Never Used    Labs: Recent Review Flowsheet Data    Labs for ITP Cardiac and Pulmonary Rehab Latest Ref Rng & Units 01/17/2014 03/13/2015 04/13/2018 06/12/2019 11/16/2019   Cholestrol 100 - 199 mg/dL 175 195 186 103 136   LDLCALC 0 - 99 mg/dL 117(H) 133(H) 114(H) 54 81   HDL >39 mg/dL 32.20(L) 34.20(L) 33.70(L) 37.40(L) 35(L)   Trlycerides 0 - 149 mg/dL 131.0 138.0 195.0(H) 61.0 105   Hemoglobin A1c 4.8 - 5.6 % - - - - 5.2      Capillary Blood Glucose: No results found for: GLUCAP   Exercise Target Goals: Exercise Program Goal: Individual exercise prescription set using results from initial 6 min walk test and THRR while considering  patient's activity barriers and safety.   Exercise Prescription  Goal: Starting with aerobic activity 30 plus minutes a day, 3 days per week for initial exercise prescription. Provide home exercise prescription and guidelines that participant acknowledges understanding prior to discharge.  Activity Barriers & Risk Stratification:  Activity Barriers & Cardiac Risk Stratification - 10/17/20 1122      Activity Barriers & Cardiac Risk Stratification   Activity Barriers History of Falls;Deconditioning    Cardiac Risk Stratification High           6 Minute Walk:  6 Minute Walk    Row Name 10/17/20 1022         6 Minute Walk   Phase Initial     Distance 1200 feet     Walk Time 6 minutes     # of Rest Breaks 0     MPH 2.27     METS 3.51     RPE 11     Perceived Dyspnea  0     VO2 Peak 12.3     Resting HR 62 bpm      Resting BP 118/84     Resting Oxygen Saturation  97 %     Exercise Oxygen Saturation  during 6 min walk 99 %     Max Ex. HR 81 bpm     Max Ex. BP 134/82     2 Minute Post BP 110/64            Oxygen Initial Assessment:   Oxygen Re-Evaluation:   Oxygen Discharge (Final Oxygen Re-Evaluation):   Initial Exercise Prescription:  Initial Exercise Prescription - 10/17/20 1100      Date of Initial Exercise RX and Referring Provider   Date 10/17/20    Referring Provider Gerrit Halls, MD    Expected Discharge Date 12/13/20      NuStep   Level 2    SPM 85    Minutes 15    METs 2      Track   Laps 10    Minutes 15    METs 2.16      Prescription Details   Frequency (times per week) 3    Duration Progress to 30 minutes of continuous aerobic without signs/symptoms of physical distress      Intensity   THRR 40-80% of Max Heartrate 68-137    Ratings of Perceived Exertion 11-13    Perceived Dyspnea 0-4      Progression   Progression Continue progressive overload as per policy without signs/symptoms or physical distress.      Resistance Training   Training Prescription Yes    Weight 3 lbs    Reps 10-15           Perform Capillary Blood Glucose checks as needed.  Exercise Prescription Changes:   Exercise Prescription Changes    Row Name 10/21/20 1400             Response to Exercise   Blood Pressure (Admit) 122/82       Blood Pressure (Exercise) 144/78       Blood Pressure (Exit) 112/70       Heart Rate (Admit) 67 bpm       Heart Rate (Exercise) 94 bpm       Heart Rate (Exit) 72 bpm       Rating of Perceived Exertion (Exercise) 11       Symptoms None       Comments Pt's first day of exercise in the CRP2 program.       Duration Progress  to 30 minutes of  aerobic without signs/symptoms of physical distress       Intensity THRR unchanged         Progression   Progression Continue to progress workloads to maintain intensity without signs/symptoms of  physical distress.       Average METs 2.3         Resistance Training   Training Prescription Yes       Weight 3 lbs       Reps 10-15       Time 10 Minutes         Interval Training   Interval Training No         NuStep   Level 2       SPM 80       Minutes 15       METs 1.8         Track   Laps 16       Minutes 15       METs 2.86              Exercise Comments:   Exercise Comments    Row Name 10/21/20 1452           Exercise Comments Pt's first day of exercise in the CRP2 program. Tolerated exercise session well with no complaints.              Exercise Goals and Review:   Exercise Goals    Row Name 10/17/20 1124             Exercise Goals   Increase Physical Activity Yes       Intervention Provide advice, education, support and counseling about physical activity/exercise needs.;Develop an individualized exercise prescription for aerobic and resistive training based on initial evaluation findings, risk stratification, comorbidities and participant's personal goals.       Expected Outcomes Short Term: Attend rehab on a regular basis to increase amount of physical activity.;Long Term: Add in home exercise to make exercise part of routine and to increase amount of physical activity.;Long Term: Exercising regularly at least 3-5 days a week.       Increase Strength and Stamina Yes       Intervention Provide advice, education, support and counseling about physical activity/exercise needs.;Develop an individualized exercise prescription for aerobic and resistive training based on initial evaluation findings, risk stratification, comorbidities and participant's personal goals.       Expected Outcomes Short Term: Increase workloads from initial exercise prescription for resistance, speed, and METs.;Long Term: Improve cardiorespiratory fitness, muscular endurance and strength as measured by increased METs and functional capacity (6MWT);Short Term: Perform resistance  training exercises routinely during rehab and add in resistance training at home       Able to understand and use rate of perceived exertion (RPE) scale Yes       Intervention Provide education and explanation on how to use RPE scale       Expected Outcomes Short Term: Able to use RPE daily in rehab to express subjective intensity level;Long Term:  Able to use RPE to guide intensity level when exercising independently       Knowledge and understanding of Target Heart Rate Range (THRR) Yes       Intervention Provide education and explanation of THRR including how the numbers were predicted and where they are located for reference       Expected Outcomes Short Term: Able to state/look up THRR;Short Term: Able to use daily  as guideline for intensity in rehab;Long Term: Able to use THRR to govern intensity when exercising independently       Able to check pulse independently Yes       Intervention Provide education and demonstration on how to check pulse in carotid and radial arteries.;Review the importance of being able to check your own pulse for safety during independent exercise       Expected Outcomes Short Term: Able to explain why pulse checking is important during independent exercise;Long Term: Able to check pulse independently and accurately       Understanding of Exercise Prescription Yes       Intervention Provide education, explanation, and written materials on patient's individual exercise prescription       Expected Outcomes Short Term: Able to explain program exercise prescription;Long Term: Able to explain home exercise prescription to exercise independently              Exercise Goals Re-Evaluation :  Exercise Goals Re-Evaluation    [place] Name 10/21/20 1451             Exercise Goal Re-Evaluation   Exercise Goals Review Increase Physical Activity;Increase Strength and Stamina;Able to understand and use rate of perceived exertion (RPE) scale;Knowledge and understanding of Target  Heart Rate Range (THRR);Able to check pulse independently;Understanding of Exercise Prescription       Comments Pt's first day of exercise in the CRP2 program. Pt tolerated exercise session well with no complaints. Pt understands RPE scale, THRR and exercise Rx.       Expected Outcomes Will continue to monitor patient nad progress as tolerated.               Discharge Exercise Prescription (Final Exercise Prescription Changes):  Exercise Prescription Changes - 10/21/20 1400      Response to Exercise   Blood Pressure (Admit) 122/82    Blood Pressure (Exercise) 144/78    Blood Pressure (Exit) 112/70    Heart Rate (Admit) 67 bpm    Heart Rate (Exercise) 94 bpm    Heart Rate (Exit) 72 bpm    Rating of Perceived Exertion (Exercise) 11    Symptoms None    Comments Pt's first day of exercise in the CRP2 program.    Duration Progress to 30 minutes of  aerobic without signs/symptoms of physical distress    Intensity THRR unchanged      Progression   Progression Continue to progress workloads to maintain intensity without signs/symptoms of physical distress.    Average METs 2.3      Resistance Training   Training Prescription Yes    Weight 3 lbs    Reps 10-15    Time 10 Minutes      Interval Training   Interval Training No      NuStep   Level 2    SPM 80    Minutes 15    METs 1.8      Track   Laps 16    Minutes 15    METs 2.86           Nutrition:  Target Goals: Understanding of nutrition guidelines, daily intake of sodium <1541m, cholesterol <2025m calories 30% from fat and 7% or less from saturated fats, daily to have 5 or more servings of fruits and vegetables.  Biometrics:  Pre Biometrics - 10/17/20 0915      Pre Biometrics   Waist Circumference 54.25 inches    Hip Circumference 51 inches    Waist to  Hip Ratio 1.06 %    Triceps Skinfold 22 mm    % Body Fat 39.6 %    Grip Strength 42 kg    Flexibility 0 in   Pt could not reach box   Single Leg Stand 9.75  seconds            Nutrition Therapy Plan and Nutrition Goals:   Nutrition Assessments:  Nutrition Assessments - 10/17/20 1443      MEDFICTS Scores   Pre Score 131           Nutrition Goals Re-Evaluation:   Nutrition Goals Discharge (Final Nutrition Goals Re-Evaluation):   Psychosocial: Target Goals: Acknowledge presence or absence of significant depression and/or stress, maximize coping skills, provide positive support system. Participant is able to verbalize types and ability to use techniques and skills needed for reducing stress and depression.  Initial Review & Psychosocial Screening:  Initial Psych Review & Screening - 10/17/20 1148      Initial Review   Current issues with History of Depression;Current Depression;Current Stress Concerns    Source of Stress Concerns Chronic Illness;Family;Occupation    Parma is currently going through a divorce. Dawon shares custody with his wife. Sabri is currently depressed he is taking an antidepressant which somewhat controlls his depression      Family Dynamics   Good Support System? Yes   Taesean lives alone. Bairon has a sister who lives near by who he has for support     Barriers   Psychosocial barriers to participate in program The patient should benefit from training in stress management and relaxation.      Screening Interventions   Interventions To provide support and resources with identified psychosocial needs;Encouraged to exercise;Provide feedback about the scores to participant   Will forward to his quality of life questionniare to his wellness or primary care physician   Expected Outcomes Short Term goal: Utilizing psychosocial counselor, staff and physician to assist with identification of specific Stressors or current issues interfering with healing process. Setting desired goal for each stressor or current issue identified.;Long Term Goal: Stressors or current issues are controlled or eliminated.;Short Term  goal: Identification and review with participant of any Quality of Life or Depression concerns found by scoring the questionnaire.;Long Term goal: The participant improves quality of Life and PHQ9 Scores as seen by post scores and/or verbalization of changes           Quality of Life Scores:  Quality of Life - 10/17/20 1112      Quality of Life   Select Quality of Life      Quality of Life Scores   Health/Function Pre 18.23 %    Socioeconomic Pre 18.86 %    Psych/Spiritual Pre 12.64 %    Family Pre 16.88 %    GLOBAL Pre 17.02 %          Scores of 19 and below usually indicate a poorer quality of life in these areas.  A difference of  2-3 points is a clinically meaningful difference.  A difference of 2-3 points in the total score of the Quality of Life Index has been associated with significant improvement in overall quality of life, self-image, physical symptoms, and general health in studies assessing change in quality of life.  PHQ-9: Recent Review Flowsheet Data    Depression screen [facility] 2/9 10/23/2020 10/17/2020 11/16/2019   Decreased Interest 0 1 1   Down, Depressed, Hopeless 0 1 1   PHQ - 2 Score  0 2 2   Altered sleeping - - 3   Tired, decreased energy - - 3   Change in appetite - - 2   Feeling bad or failure about yourself  - - 3   Trouble concentrating - - 1   Moving slowly or fidgety/restless - - 1   Suicidal thoughts - - 0   PHQ-9 Score - - 15   Difficult doing work/chores - - Somewhat difficult     Interpretation of Total Score  Total Score Depression Severity:  1-4 = Minimal depression, 5-9 = Mild depression, 10-14 = Moderate depression, 15-19 = Moderately severe depression, 20-27 = Severe depression   Psychosocial Evaluation and Intervention:   Psychosocial Re-Evaluation:  Psychosocial Re-Evaluation    Row Name 10/23/20 1610             Psychosocial Re-Evaluation   Current issues with Current Stress Concerns;Current Depression;History of  Depression;Current Anxiety/Panic       Comments Reviewed quality of life on 10/23/20. Says depression is better since starting cardiac rehab       Expected Outcomes Patient will have decreased depression and anxiety upon the completion of phase 2 cardiac rehab       Continue Psychosocial Services  Follow up required by staff       Comments Sakib is currently going through a divorce. Brylin shares custody with his wife. Kalem is currently depressed he is taking an antidepressant which somewhat controlls his depression         Initial Review   Source of Stress Concerns Chronic Illness;Family              Psychosocial Discharge (Final Psychosocial Re-Evaluation):  Psychosocial Re-Evaluation - 10/23/20 1610      Psychosocial Re-Evaluation   Current issues with Current Stress Concerns;Current Depression;History of Depression;Current Anxiety/Panic    Comments Reviewed quality of life on 10/23/20. Says depression is better since starting cardiac rehab    Expected Outcomes Patient will have decreased depression and anxiety upon the completion of phase 2 cardiac rehab    Continue Psychosocial Services  Follow up required by staff    Comments Keisuke is currently going through a divorce. Aleksandar shares custody with his wife. Semir is currently depressed he is taking an antidepressant which somewhat controlls his depression      Initial Review   Source of Stress Concerns Chronic Illness;Family           Vocational Rehabilitation: Provide vocational rehab assistance to qualifying candidates.   Vocational Rehab Evaluation & Intervention:  Vocational Rehab - 10/17/20 1157      Initial Vocational Rehab Evaluation & Intervention   Assessment shows need for Vocational Rehabilitation No   Camila is currently working and does not need vocational rehab at this time          Education: Education Goals: Education classes will be provided on a weekly basis, covering required topics. Participant will state  understanding/return demonstration of topics presented.  Learning Barriers/Preferences:  Learning Barriers/Preferences - 10/17/20 1116      Learning Barriers/Preferences   Learning Barriers Sight;Hearing   wears glasses   Learning Preferences Computer/Internet;Pictoral;Skilled Demonstration           Education Topics: Hypertension, Hypertension Reduction -Define heart disease and high blood pressure. Discus how high blood pressure affects the body and ways to reduce high blood pressure.   Exercise and Your Heart -Discuss why it is important to exercise, the FITT principles of exercise, normal and abnormal responses to  exercise, and how to exercise safely.   Angina -Discuss definition of angina, causes of angina, treatment of angina, and how to decrease risk of having angina.   Cardiac Medications -Review what the following cardiac medications are used for, how they affect the body, and side effects that may occur when taking the medications.  Medications include Aspirin, Beta blockers, calcium channel blockers, ACE Inhibitors, angiotensin receptor blockers, diuretics, digoxin, and antihyperlipidemics.   Congestive Heart Failure -Discuss the definition of CHF, how to live with CHF, the signs and symptoms of CHF, and how keep track of weight and sodium intake.   Heart Disease and Intimacy -Discus the effect sexual activity has on the heart, how changes occur during intimacy as we age, and safety during sexual activity.   Smoking Cessation / COPD -Discuss different methods to quit smoking, the health benefits of quitting smoking, and the definition of COPD.   Nutrition I: Fats -Discuss the types of cholesterol, what cholesterol does to the heart, and how cholesterol levels can be controlled.   Nutrition II: Labels -Discuss the different components of food labels and how to read food label   Heart Parts/Heart Disease and PAD -Discuss the anatomy of the heart, the  pathway of blood circulation through the heart, and these are affected by heart disease.   Stress I: Signs and Symptoms -Discuss the causes of stress, how stress may lead to anxiety and depression, and ways to limit stress.   Stress II: Relaxation -Discuss different types of relaxation techniques to limit stress.   Warning Signs of Stroke / TIA -Discuss definition of a stroke, what the signs and symptoms are of a stroke, and how to identify when someone is having stroke.   Knowledge Questionnaire Score:  Knowledge Questionnaire Score - 10/17/20 1115      Knowledge Questionnaire Score   Pre Score 21/24           Core Components/Risk Factors/Patient Goals at Admission:  Personal Goals and Risk Factors at Admission - 10/17/20 1118      Core Components/Risk Factors/Patient Goals on Admission    Weight Management Yes;Obesity;Weight Loss    Intervention Weight Management: Develop a combined nutrition and exercise program designed to reach desired caloric intake, while maintaining appropriate intake of nutrient and fiber, sodium and fats, and appropriate energy expenditure required for the weight goal.;Weight Management: Provide education and appropriate resources to help participant work on and attain dietary goals.;Weight Management/Obesity: Establish reasonable short term and long term weight goals.;Obesity: Provide education and appropriate resources to help participant work on and attain dietary goals.    Admit Weight 272 lb 11.3 oz (123.7 kg)    Expected Outcomes Short Term: Continue to assess and modify interventions until short term weight is achieved;Long Term: Adherence to nutrition and physical activity/exercise program aimed toward attainment of established weight goal;Weight Maintenance: Understanding of the daily nutrition guidelines, which includes 25-35% calories from fat, 7% or less cal from saturated fats, less than 291m cholesterol, less than 1.5gm of sodium, & 5 or more  servings of fruits and vegetables daily;Weight Loss: Understanding of general recommendations for a balanced deficit meal plan, which promotes 1-2 lb weight loss per week and includes a negative energy balance of [phone] kcal/d;Understanding recommendations for meals to include 15-35% energy as protein, 25-35% energy from fat, 35-60% energy from carbohydrates, less than 2081mof dietary cholesterol, 20-35 gm of total fiber daily;Understanding of distribution of calorie intake throughout the day with the consumption of 4-5 meals/snacks  Hypertension Yes    Intervention Provide education on lifestyle modifcations including regular physical activity/exercise, weight management, moderate sodium restriction and increased consumption of fresh fruit, vegetables, and low fat dairy, alcohol moderation, and smoking cessation.;Monitor prescription use compliance.    Expected Outcomes Short Term: Continued assessment and intervention until BP is < 140/71m HG in hypertensive participants. < 130/851mHG in hypertensive participants with diabetes, heart failure or chronic kidney disease.;Long Term: Maintenance of blood pressure at goal levels.    Lipids Yes    Intervention Provide education and support for participant on nutrition & aerobic/resistive exercise along with prescribed medications to achieve LDL <7054mHDL >61m13m  Expected Outcomes Short Term: Participant states understanding of desired cholesterol values and is compliant with medications prescribed. Participant is following exercise prescription and nutrition guidelines.;Long Term: Cholesterol controlled with medications as prescribed, with individualized exercise RX and with personalized nutrition plan. Value goals: LDL < 70mg55mL > 40 mg.    Stress Yes    Intervention Offer individual and/or small group education and counseling on adjustment to heart disease, stress management and health-related lifestyle change. Teach and support self-help  strategies.;Refer participants experiencing significant psychosocial distress to appropriate mental health specialists for further evaluation and treatment. When possible, include family members and significant others in education/counseling sessions.    Expected Outcomes Short Term: Participant demonstrates changes in health-related behavior, relaxation and other stress management skills, ability to obtain effective social support, and compliance with psychotropic medications if prescribed.;Long Term: Emotional wellbeing is indicated by absence of clinically significant psychosocial distress or social isolation.           Core Components/Risk Factors/Patient Goals Review:   Goals and Risk Factor Review    Row Name 10/23/20 1623             Core Components/Risk Factors/Patient Goals Review   Personal Goals Review Weight Management/Obesity;Hypertension;Lipids;Stress       Review Jiovanni Giannted exercise on 10/21/26. Off to a good start to exercise       Expected Outcomes Artis Raahim continue to partcipate in phase 2 cardiac rehab for exercise, nutrtion and lifestyle modifications              Core Components/Risk Factors/Patient Goals at Discharge (Final Review):   Goals and Risk Factor Review - 10/23/20 1623      Core Components/Risk Factors/Patient Goals Review   Personal Goals Review Weight Management/Obesity;Hypertension;Lipids;Stress    Review Keynan Quinntinted exercise on 10/21/26. Off to a good start to exercise    Expected Outcomes Damarcus Natthew continue to partcipate in phase 2 cardiac rehab for exercise, nutrtion and lifestyle modifications           ITP Comments:  ITP Comments    Row Name 10/15/20 1138 10/17/20 1143 10/23/20 1609       ITP Comments Dr TraciFransico HimMedical Director Dr TraciFransico HimMedical Director 30 Day ITP Review. Kailan started exercise on 10/21/20 off to a good start to exercise            Comments: See ITP comments.MariaBarnet Pall,BSN 10/24/2020 12:33 PM

## 2020-10-23 NOTE — Progress Notes (Signed)
PHQ 2-9 =0. QUALITY OF LIFE SCORE REVIEW  Pt completed Quality of Life survey as a participant in Cardiac Rehab. Scores 21.0 or below are considered low. Pt score very low in several areas Overall 17.02, Health and Function 18.23, socioeconomic 18.86, physiological and spiritual 12.64, family 16.88. Patient quality of life slightly altered by physical constraints which limits ability to perform as prior to recent cardiac illness. Cindy denies being depressed today and says that he is feeling a little better since he has been participating in phase 2 cardiac rehab this week.Liana Gerold emotional support and reassurance.  Will continue to monitor and intervene as necessary.  Will forward quality of life results to Dr Juleen China at [facility] per patients request.Nahia Nissan Venetia Maxon, RN,BSN 10/23/2020 3:01 PM

## 2020-10-25 ENCOUNTER — Encounter (HOSPITAL_COMMUNITY)
Admission: RE | Admit: 2020-10-25 | Discharge: 2020-10-25 | Disposition: A | Discharge status: Home / Self Care | Payer: No Typology Code available for payment source | Source: Ambulatory Visit | Attending: Interventional Cardiology | Admitting: Interventional Cardiology

## 2020-10-25 ENCOUNTER — Other Ambulatory Visit: Payer: Self-pay

## 2020-10-25 DIAGNOSIS — Z955 Presence of coronary angioplasty implant and graft: Secondary | ICD-10-CM | POA: Diagnosis not present

## 2020-10-25 NOTE — Progress Notes (Signed)
Hector [name] 49 y.o. male Nutrition Note   Visit Diagnosis: S/P drug eluting coronary stent placement 08/19/20  Past Medical History:  Diagnosis Date  . Alcohol abuse   . Allergy   . Anal fissure   . Anxiety   . Asthma   . Back pain   . Bilateral swelling of feet   . Blood in stool   . Chest pain   . Coronary artery disease   . Depression   . Drug use   . Febrile seizure ([place])   . Heartburn   . Hyperlipidemia   . Hypertension   . Irritable bowel syndrome with diarrhea   . Jaundice, newborn   . Palpitations   . Panic   . Panic disorder   . Pre-diabetes   . Seizures ([facility])    febrile  . Shortness of breath   . Sleep apnea      Medications reviewed.   Current Outpatient Medications:  .  aspirin EC 81 MG tablet, Take 1 tablet (81 mg total) by mouth daily. Swallow whole., Disp: 150 tablet, Rfl: 2 .  atorvastatin (LIPITOR) 80 MG tablet, Take 1 tablet (80 mg total) by mouth daily., Disp: 90 tablet, Rfl: 3 .  FLUoxetine (PROZAC) 10 MG capsule, TAKE 1 CAPSULE DAILY, Disp: 90 capsule, Rfl: 3 .  LORazepam (ATIVAN) 1 MG tablet, Take 1 tablet (1 mg total) by mouth every 8 (eight) hours as needed for anxiety., Disp: 30 tablet, Rfl: 0 .  losartan (COZAAR) 50 MG tablet, Take 1 tablet (50 mg total) by mouth daily., Disp: 90 tablet, Rfl: 3 .  nebivolol (BYSTOLIC) 10 MG tablet, TAKE 1 TABLET BY MOUTH ONCE DAILY, Disp: 90 tablet, Rfl: 3 .  Semaglutide-Weight Management (WEGOVY) 0.25 MG/0.5ML SOAJ, Inject 0.25 mg into the skin once a week., Disp: 2 mL, Rfl: 0 .  ticagrelor (BRILINTA) 90 MG TABS tablet, Take 1 tablet (90 mg total) by mouth 2 (two) times daily., Disp: 180 tablet, Rfl: 3 .  Vitamin D, Ergocalciferol, (DRISDOL) 1.25 MG (50000 UNIT) CAPS capsule, Take 1 capsule (50,000 Units total) by mouth every 7 (seven) days. (Patient not taking: Reported on 10/11/2020), Disp: 4 capsule, Rfl: 0   Ht Readings from Last 1 Encounters:  10/17/20 5' 9.5" (1.765 m)     Wt Readings from Last 3  Encounters:  10/17/20 272 lb 11.3 oz (123.7 kg)  10/08/20 272 lb (123.4 kg)  09/25/20 263 lb (119.3 kg)     There is no height or weight on file to calculate BMI.   Social History   Tobacco Use  Smoking Status Never Smoker  Smokeless Tobacco Never Used     Lab Results  Component Value Date   CHOL 136 11/16/2019   Lab Results  Component Value Date   HDL 35 (L) 11/16/2019   Lab Results  Component Value Date   LDLCALC 81 11/16/2019   Lab Results  Component Value Date   TRIG 105 11/16/2019     Lab Results  Component Value Date   HGBA1C 5.2 11/16/2019     CBG (last 3)  No results for input(s): GLUCAP in the last 72 hours.   Nutrition Note  Spoke with pt. Nutrition Plan and Nutrition Survey goals reviewed with pt.   Pt eats out daily.  Sedentary job. Not very active. 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  He is has been with healthy weight and wellness for a year.  He states restriction/binge cycle  occurring with diet.  He states goal of increasing exercise/physical activity.  Smokes marijuana daily every other week. He reports staying up late those nights. This leads to feelings of fatigue during the day.  He wants to start walking over lunch break on days he is not tired (sometimes he opts for napping). Will continue working on behavior modification to help pt meet his goals.    Pt expressed understanding of the information reviewed.   Nutrition Diagnosis  ? Obese  II = 35-39.9 related to excessive energy intake as evidenced by a 38.54 kg/m2  Nutrition Intervention ? Pt's individual nutrition plan reviewed with pt. ? Benefits of adopting Heart Healthy diet discussed when Medficts reviewed.   ? Continue client-centered nutrition education by RD, as part of interdisciplinary care.  Goal(s) ? Pt to identify food quantities necessary to achieve weight loss of 6-24 lb at graduation from cardiac rehab.  ? Pt to build a healthy plate  including vegetables, fruits, whole grains, and low-fat dairy products in a heart healthy meal plan.  Plan:   Will provide client-centered nutrition education as part of interdisciplinary care  Monitor and evaluate progress toward nutrition goal with team.   Michaele Offer, MS, RDN, LDN

## 2020-10-26 ENCOUNTER — Ambulatory Visit: Payer: No Typology Code available for payment source | Attending: Internal Medicine

## 2020-10-26 DIAGNOSIS — Z23 Encounter for immunization: Secondary | ICD-10-CM

## 2020-10-26 NOTE — Progress Notes (Signed)
   Covid-19 Vaccination Clinic  Name:  MARX DOIG    MRN: [id number] DOB: [birth year]  10/26/2020  Mr. Laboy was observed post Covid-19 immunization for 15 minutes without incident. He was provided with Vaccine Information Sheet and instruction to access the V-Safe system.   Mr. Prien was instructed to call 911 with any severe reactions post vaccine: Marland Kitchen Difficulty breathing  . Swelling of face and throat  . A fast heartbeat  . A bad rash all over body  . Dizziness and weakness

## 2020-10-28 ENCOUNTER — Other Ambulatory Visit: Payer: Self-pay

## 2020-10-28 ENCOUNTER — Encounter (HOSPITAL_COMMUNITY)
Admission: RE | Admit: 2020-10-28 | Discharge: 2020-10-28 | Disposition: A | Discharge status: Home / Self Care | Payer: No Typology Code available for payment source | Source: Ambulatory Visit | Attending: Interventional Cardiology | Admitting: Interventional Cardiology

## 2020-10-28 DIAGNOSIS — Z955 Presence of coronary angioplasty implant and graft: Secondary | ICD-10-CM | POA: Insufficient documentation

## 2020-10-29 ENCOUNTER — Other Ambulatory Visit: Payer: Self-pay

## 2020-10-29 ENCOUNTER — Ambulatory Visit (INDEPENDENT_AMBULATORY_CARE_PROVIDER_SITE_OTHER): Payer: No Typology Code available for payment source | Admitting: Family Medicine

## 2020-10-29 ENCOUNTER — Encounter (INDEPENDENT_AMBULATORY_CARE_PROVIDER_SITE_OTHER): Payer: Self-pay | Admitting: Family Medicine

## 2020-10-29 VITALS — BP 131/86 | HR 62 | Temp 97.8°F | Ht 69.0 in | Wt 261.0 lb

## 2020-10-29 DIAGNOSIS — R197 Diarrhea, unspecified: Secondary | ICD-10-CM

## 2020-10-29 DIAGNOSIS — R632 Polyphagia: Secondary | ICD-10-CM

## 2020-10-29 DIAGNOSIS — Z9189 Other specified personal risk factors, not elsewhere classified: Secondary | ICD-10-CM

## 2020-10-29 DIAGNOSIS — Z6838 Body mass index (BMI) 38.0-38.9, adult: Secondary | ICD-10-CM

## 2020-10-29 MED ORDER — WEGOVY 0.25 MG/0.5ML ~~LOC~~ SOAJ: 0.2500 mg | SUBCUTANEOUS | 0 refills | Status: DC

## 2020-10-29 MED ORDER — METRONIDAZOLE 500 MG PO TABS: 500.0000 mg | ORAL_TABLET | Freq: Three times a day (TID) | ORAL | 0 refills | Status: DC

## 2020-10-30 ENCOUNTER — Encounter (HOSPITAL_COMMUNITY)
Admission: RE | Admit: 2020-10-30 | Discharge: 2020-10-30 | Disposition: A | Discharge status: Home / Self Care | Payer: No Typology Code available for payment source | Source: Ambulatory Visit | Attending: Interventional Cardiology | Admitting: Interventional Cardiology

## 2020-10-30 DIAGNOSIS — Z955 Presence of coronary angioplasty implant and graft: Secondary | ICD-10-CM | POA: Diagnosis not present

## 2020-10-30 NOTE — Progress Notes (Signed)
Chief Complaint:   OBESITY Hector [name] is here to discuss his progress with his obesity treatment plan along with follow-up of his obesity related diagnoses.   Today's visit was #: 16 Starting weight: 282 lbs Starting date: 01/30/2020 Today's weight: 261 lbs Today's date: 10/29/2020 Total lbs lost to date: 21 lbs Body mass index is 38.54 kg/m.  Total weight loss percentage to date: -7.45%  Interim History: Hector [name] is on a low FODMAP diet and reports less sulphur belch.  He has been exercising through cardiac rehab.  He says his stools have been fatty and yellow.  Hx recently - 34 out of 40 days, reports diarrhea/sulphur belch.  He bought a men's vitamin to combat malnutrition. Nutrition Plan: Category 2 for 20% of the time. Anti-obesity medications: Wegovy.  Activity: Cardiac Rehab for 60 minutes 3 times per week.  Assessment/Plan:   1. Diarrhea We discussed options for further work-up and treatment. Options included: commit to LOW FODMAP for a few more weeks, trial Flagyl to treat potential colitis, or repeat stool studies. Hector [name] will stick with the new diet plan and have the Abx if worsening.   - Start metroNIDAZOLE (FLAGYL) 500 MG tablet; Take 1 tablet (500 mg total) by mouth 3 (three) times daily.  Dispense: 21 tablet; Refill: 0  2. Polyphagia He will continue to focus on protein-rich, low simple carbohydrate foods. We reviewed the importance of hydration, regular exercise for stress reduction, and restorative sleep. He will restart Wegovy once diarrhea resolves.   3. At risk for nausea ALIAS Hector [name] was given approximately 15 minutes of nausea prevention counseling today. [name] is at risk for nausea due to his new or current medication. He was encouraged to titrate his medication slowly, make sure to stay hydrated, eat smaller portions throughout the day, and avoid high fat meals.   4. Class 2 severe obesity with serious comorbidity and body mass index (BMI) of 38.0 to 38.9 in adult,  unspecified obesity type ([place])  - Refill Semaglutide-Weight Management (WEGOVY) 0.25 MG/0.5ML SOAJ; Inject 0.25 mg into the skin once a week.  Dispense: 2 mL; Refill: 0  Course: Hector [name] is currently in the action stage of change. As such, his goal is to continue with weight loss efforts.   Nutrition goals: He has agreed to low FODMAP - 1200 calories/95 grams of protein.   Exercise goals: Cardiac Rehab.  Behavioral modification strategies: decreasing simple carbohydrates and increasing water intake.  Hector [name] has agreed to follow-up with our clinic in 3 weeks. He was informed of the importance of frequent follow-up visits to maximize his success with intensive lifestyle modifications for his multiple health conditions.   Objective:   Blood pressure 131/86, pulse 62, temperature 97.8 F (36.6 C), temperature source Oral, height 5' 9"  (1.753 m), weight 261 lb (118.4 kg), SpO2 96 %. Body mass index is 38.54 kg/m.  General: Cooperative, alert, well developed, in no acute distress. HEENT: Conjunctivae and lids unremarkable. Cardiovascular: Regular rhythm.  Lungs: Normal work of breathing. Neurologic: No focal deficits.   Lab Results  Component Value Date   CREATININE 1.32 09/26/2020   BUN 22 09/26/2020   NA 142 09/26/2020   K 4.2 09/26/2020   CL 112 (H) 09/26/2020   CO2 19 (L) 09/26/2020   Lab Results  Component Value Date   ALT 21 11/16/2019   AST 18 11/16/2019   ALKPHOS 89 11/16/2019   BILITOT 0.3 11/16/2019   Lab Results  Component Value Date   HGBA1C 5.2  11/16/2019   Lab Results  Component Value Date   INSULIN 35.9 (H) 11/16/2019   Lab Results  Component Value Date   TSH 2.410 11/16/2019   Lab Results  Component Value Date   CHOL 136 11/16/2019   HDL 35 (L) 11/16/2019   LDLCALC 81 11/16/2019   TRIG 105 11/16/2019   CHOLHDL 3 06/12/2019   Lab Results  Component Value Date   WBC 4.5 08/07/2020   HGB 15.2 08/07/2020   HCT 44.2 08/07/2020   MCV 85.8 08/07/2020     PLT 180 08/07/2020   Attestation Statements:   Reviewed by clinician on day of visit: allergies, medications, problem list, medical history, surgical history, family history, social history, and previous encounter notes.  I, Water quality scientist, CMA, am acting as transcriptionist for Briscoe Deutscher, DO  I have reviewed the above documentation for accuracy and completeness, and I agree with the above. Briscoe Deutscher, DO

## 2020-10-31 ENCOUNTER — Encounter (INDEPENDENT_AMBULATORY_CARE_PROVIDER_SITE_OTHER): Payer: Self-pay | Admitting: Family Medicine

## 2020-10-31 NOTE — Progress Notes (Signed)
Reviewed home exercise Rx with patient today. Pt is not currently exercising at home. Reviewed goal of 5-7 days/week of exercise. Pt agrees to add 2 days of walking at home for 30 minutes. He plans to walk with his sister. Reviewed warm-up, cool-down and stretching as well as THRR of 68-137. Fluids encouraged before, during and after exercise. Concept of RPE at 11-13 with exercise reinforced. Weather parameters for temperature and humidity reviewed. S/S to terminate exercise discussed and when to call Md vs 911. Reviewed use of NTG and encouraged to carry at all times. Encouraged to carry a cell phone if walking outdoors. Pt verbalized understanding of home exercise Rx and was provided a copy.  Lesly Rubenstein MS, ACSM-EP-C, CCRP

## 2020-10-31 NOTE — Telephone Encounter (Signed)
Dr Juleen China pt

## 2020-11-01 ENCOUNTER — Encounter (HOSPITAL_COMMUNITY)
Admission: RE | Admit: 2020-11-01 | Discharge: 2020-11-01 | Disposition: A | Discharge status: Home / Self Care | Payer: No Typology Code available for payment source | Source: Ambulatory Visit | Attending: Interventional Cardiology | Admitting: Interventional Cardiology

## 2020-11-01 ENCOUNTER — Other Ambulatory Visit: Payer: Self-pay

## 2020-11-01 DIAGNOSIS — Z955 Presence of coronary angioplasty implant and graft: Secondary | ICD-10-CM

## 2020-11-04 ENCOUNTER — Other Ambulatory Visit: Payer: Self-pay

## 2020-11-04 ENCOUNTER — Encounter (HOSPITAL_COMMUNITY)
Admission: RE | Admit: 2020-11-04 | Discharge: 2020-11-04 | Disposition: A | Discharge status: Home / Self Care | Payer: No Typology Code available for payment source | Source: Ambulatory Visit | Attending: Interventional Cardiology | Admitting: Interventional Cardiology

## 2020-11-04 DIAGNOSIS — Z955 Presence of coronary angioplasty implant and graft: Secondary | ICD-10-CM | POA: Diagnosis not present

## 2020-11-06 ENCOUNTER — Other Ambulatory Visit: Payer: Self-pay

## 2020-11-06 ENCOUNTER — Encounter (HOSPITAL_COMMUNITY)
Admission: RE | Admit: 2020-11-06 | Discharge: 2020-11-06 | Disposition: A | Discharge status: Home / Self Care | Payer: No Typology Code available for payment source | Source: Ambulatory Visit | Attending: Interventional Cardiology | Admitting: Interventional Cardiology

## 2020-11-06 DIAGNOSIS — Z955 Presence of coronary angioplasty implant and graft: Secondary | ICD-10-CM | POA: Diagnosis not present

## 2020-11-07 NOTE — Telephone Encounter (Signed)
Dr Juleen China pt

## 2020-11-08 ENCOUNTER — Encounter (HOSPITAL_COMMUNITY)
Admission: RE | Admit: 2020-11-08 | Discharge: 2020-11-08 | Disposition: A | Discharge status: Home / Self Care | Payer: No Typology Code available for payment source | Source: Ambulatory Visit | Attending: Interventional Cardiology | Admitting: Interventional Cardiology

## 2020-11-08 ENCOUNTER — Other Ambulatory Visit: Payer: Self-pay

## 2020-11-08 DIAGNOSIS — Z955 Presence of coronary angioplasty implant and graft: Secondary | ICD-10-CM | POA: Diagnosis not present

## 2020-11-11 ENCOUNTER — Encounter (HOSPITAL_COMMUNITY)
Admission: RE | Admit: 2020-11-11 | Discharge: 2020-11-11 | Disposition: A | Discharge status: Home / Self Care | Payer: No Typology Code available for payment source | Source: Ambulatory Visit | Attending: Interventional Cardiology | Admitting: Interventional Cardiology

## 2020-11-11 ENCOUNTER — Other Ambulatory Visit: Payer: Self-pay

## 2020-11-11 DIAGNOSIS — Z955 Presence of coronary angioplasty implant and graft: Secondary | ICD-10-CM | POA: Diagnosis not present

## 2020-11-13 ENCOUNTER — Other Ambulatory Visit: Payer: Self-pay

## 2020-11-13 ENCOUNTER — Encounter (HOSPITAL_COMMUNITY)
Admission: RE | Admit: 2020-11-13 | Discharge: 2020-11-13 | Disposition: A | Discharge status: Home / Self Care | Payer: No Typology Code available for payment source | Source: Ambulatory Visit | Attending: Interventional Cardiology | Admitting: Interventional Cardiology

## 2020-11-13 DIAGNOSIS — Z955 Presence of coronary angioplasty implant and graft: Secondary | ICD-10-CM | POA: Diagnosis not present

## 2020-11-14 ENCOUNTER — Encounter (INDEPENDENT_AMBULATORY_CARE_PROVIDER_SITE_OTHER): Payer: Self-pay | Admitting: Family Medicine

## 2020-11-14 ENCOUNTER — Other Ambulatory Visit: Payer: Self-pay

## 2020-11-14 ENCOUNTER — Ambulatory Visit (INDEPENDENT_AMBULATORY_CARE_PROVIDER_SITE_OTHER): Payer: No Typology Code available for payment source | Admitting: Family Medicine

## 2020-11-14 VITALS — BP 132/81 | HR 63 | Temp 98.5°F | Ht 69.0 in | Wt 270.0 lb

## 2020-11-14 DIAGNOSIS — K529 Noninfective gastroenteritis and colitis, unspecified: Secondary | ICD-10-CM | POA: Diagnosis not present

## 2020-11-14 DIAGNOSIS — F419 Anxiety disorder, unspecified: Secondary | ICD-10-CM

## 2020-11-14 DIAGNOSIS — L03115 Cellulitis of right lower limb: Secondary | ICD-10-CM | POA: Diagnosis not present

## 2020-11-14 DIAGNOSIS — R600 Localized edema: Secondary | ICD-10-CM | POA: Diagnosis not present

## 2020-11-14 DIAGNOSIS — I2510 Atherosclerotic heart disease of native coronary artery without angina pectoris: Secondary | ICD-10-CM

## 2020-11-14 DIAGNOSIS — Z6841 Body Mass Index (BMI) 40.0 and over, adult: Secondary | ICD-10-CM

## 2020-11-14 DIAGNOSIS — Z9189 Other specified personal risk factors, not elsewhere classified: Secondary | ICD-10-CM | POA: Diagnosis not present

## 2020-11-14 MED ORDER — MUPIROCIN 2 % EX OINT: 1.0000 "application " | TOPICAL_OINTMENT | Freq: Two times a day (BID) | CUTANEOUS | 2 refills | Status: DC

## 2020-11-14 MED ORDER — HYDROCHLOROTHIAZIDE 12.5 MG PO CAPS: 12.5000 mg | ORAL_CAPSULE | Freq: Every day | ORAL | 0 refills | Status: DC | PRN

## 2020-11-14 MED ORDER — CEPHALEXIN 500 MG PO CAPS: 500.0000 mg | ORAL_CAPSULE | Freq: Four times a day (QID) | ORAL | 0 refills | Status: AC

## 2020-11-15 ENCOUNTER — Encounter (HOSPITAL_COMMUNITY)
Admission: RE | Admit: 2020-11-15 | Discharge: 2020-11-15 | Disposition: A | Discharge status: Home / Self Care | Payer: No Typology Code available for payment source | Source: Ambulatory Visit | Attending: Interventional Cardiology | Admitting: Interventional Cardiology

## 2020-11-15 DIAGNOSIS — Z955 Presence of coronary angioplasty implant and graft: Secondary | ICD-10-CM | POA: Diagnosis not present

## 2020-11-18 ENCOUNTER — Encounter (HOSPITAL_COMMUNITY): Payer: No Typology Code available for payment source

## 2020-11-19 ENCOUNTER — Encounter (HOSPITAL_COMMUNITY): Payer: Self-pay

## 2020-11-19 DIAGNOSIS — Z955 Presence of coronary angioplasty implant and graft: Secondary | ICD-10-CM

## 2020-11-19 NOTE — Progress Notes (Signed)
Cardiac Individual Treatment Plan  Patient Details  Name: NYZAIAH KAI MRN: [id number] Date of Birth: 1971/09/16 Referring Provider:     [facility] from 10/17/2020 in [facility]  Referring Provider Gerrit Halls, MD      Initial Encounter Date:    CARDIAC REHAB PHASE II ORIENTATION from 10/17/2020 in [facility]  Date 10/17/20      Visit Diagnosis: S/P drug eluting coronary stent placement 08/19/20  Patient's Home Medications on Admission:  Current Outpatient Medications:  .  aspirin EC 81 MG tablet, Take 1 tablet (81 mg total) by mouth daily. Swallow whole., Disp: 150 tablet, Rfl: 2 .  atorvastatin (LIPITOR) 80 MG tablet, Take 1 tablet (80 mg total) by mouth daily., Disp: 90 tablet, Rfl: 3 .  cephALEXin (KEFLEX) 500 MG capsule, Take 1 capsule (500 mg total) by mouth 4 (four) times daily for 10 days., Disp: 40 capsule, Rfl: 0 .  FLUoxetine (PROZAC) 10 MG capsule, TAKE 1 CAPSULE DAILY, Disp: 90 capsule, Rfl: 3 .  hydrochlorothiazide (MICROZIDE) 12.5 MG capsule, Take 1 capsule (12.5 mg total) by mouth daily as needed (edema)., Disp: 30 capsule, Rfl: 0 .  LORazepam (ATIVAN) 1 MG tablet, Take 1 tablet (1 mg total) by mouth every 8 (eight) hours as needed for anxiety., Disp: 30 tablet, Rfl: 0 .  losartan (COZAAR) 50 MG tablet, Take 1 tablet (50 mg total) by mouth daily., Disp: 90 tablet, Rfl: 3 .  mupirocin ointment (BACTROBAN) 2 %, Apply 1 application topically 2 (two) times daily., Disp: 30 g, Rfl: 2 .  nebivolol (BYSTOLIC) 10 MG tablet, TAKE 1 TABLET BY MOUTH ONCE DAILY, Disp: 90 tablet, Rfl: 3 .  Semaglutide-Weight Management (WEGOVY) 0.25 MG/0.5ML SOAJ, Inject 0.25 mg into the skin once a week., Disp: 2 mL, Rfl: 0 .  ticagrelor (BRILINTA) 90 MG TABS tablet, Take 1 tablet (90 mg total) by mouth 2 (two) times daily., Disp: 180 tablet, Rfl: 3  Past Medical History: Past Medical History:    Diagnosis Date  . Alcohol abuse   . Allergy   . Anal fissure   . Anxiety   . Asthma   . Back pain   . Bilateral swelling of feet   . Blood in stool   . Chest pain   . Coronary artery disease   . Depression   . Drug use   . Febrile seizure ([facility])   . Heartburn   . Hyperlipidemia   . Hypertension   . Irritable bowel syndrome with diarrhea   . Jaundice, newborn   . Palpitations   . Panic   . Panic disorder   . Pre-diabetes   . Seizures ([facility])    febrile  . Shortness of breath   . Sleep apnea     Tobacco Use: Social History   Tobacco Use  Smoking Status Never Smoker  Smokeless Tobacco Never Used    Labs: Recent Review Flowsheet Data    Labs for ITP Cardiac and Pulmonary Rehab Latest Ref Rng & Units 01/17/2014 03/13/2015 04/13/2018 06/12/2019 11/16/2019   Cholestrol 100 - 199 mg/dL 175 195 186 103 136   LDLCALC 0 - 99 mg/dL 117(H) 133(H) 114(H) 54 81   HDL >39 mg/dL 32.20(L) 34.20(L) 33.70(L) 37.40(L) 35(L)   Trlycerides 0 - 149 mg/dL 131.0 138.0 195.0(H) 61.0 105   Hemoglobin A1c 4.8 - 5.6 % - - - - 5.2      Capillary Blood Glucose: No results  found for: GLUCAP   Exercise Target Goals: Exercise Program Goal: Individual exercise prescription set using results from initial 6 min walk test and THRR while considering  patient's activity barriers and safety.   Exercise Prescription Goal: Initial exercise prescription builds to 30-45 minutes a day of aerobic activity, 2-3 days per week.  Home exercise guidelines will be given to patient during program as part of exercise prescription that the participant will acknowledge.  Activity Barriers & Risk Stratification:  Activity Barriers & Cardiac Risk Stratification - 10/17/20 1122      Activity Barriers & Cardiac Risk Stratification   Activity Barriers History of Falls;Deconditioning    Cardiac Risk Stratification High           6 Minute Walk:  6 Minute Walk    Row Name 10/17/20 1022         6 Minute Walk    Phase Initial     Distance 1200 feet     Walk Time 6 minutes     # of Rest Breaks 0     MPH 2.27     METS 3.51     RPE 11     Perceived Dyspnea  0     VO2 Peak 12.3     Resting HR 62 bpm     Resting BP 118/84     Resting Oxygen Saturation  97 %     Exercise Oxygen Saturation  during 6 min walk 99 %     Max Ex. HR 81 bpm     Max Ex. BP 134/82     2 Minute Post BP 110/64            Oxygen Initial Assessment:   Oxygen Re-Evaluation:   Oxygen Discharge (Final Oxygen Re-Evaluation):   Initial Exercise Prescription:  Initial Exercise Prescription - 10/17/20 1100      Date of Initial Exercise RX and Referring Provider   Date 10/17/20    Referring Provider Gerrit Halls, MD    Expected Discharge Date 12/13/20      NuStep   Level 2    SPM 85    Minutes 15    METs 2      Track   Laps 10    Minutes 15    METs 2.16      Prescription Details   Frequency (times per week) 3    Duration Progress to 30 minutes of continuous aerobic without signs/symptoms of physical distress      Intensity   THRR 40-80% of Max Heartrate 68-137    Ratings of Perceived Exertion 11-13    Perceived Dyspnea 0-4      Progression   Progression Continue progressive overload as per policy without signs/symptoms or physical distress.      Resistance Training   Training Prescription Yes    Weight 3 lbs    Reps 10-15           Perform Capillary Blood Glucose checks as needed.  Exercise Prescription Changes:  Exercise Prescription Changes    Row Name 10/21/20 1400 10/30/20 1445 11/11/20 1625         Response to Exercise   Blood Pressure (Admit) 122/82 140/76 128/68     Blood Pressure (Exercise) 144/78 132/72 140/80     Blood Pressure (Exit) 112/70 120/80 110/54     Heart Rate (Admit) 67 bpm 71 bpm 75 bpm     Heart Rate (Exercise) 94 bpm 88 bpm 92 bpm     Heart Rate (  Exit) 72 bpm 71 bpm 52 bpm     Rating of Perceived Exertion (Exercise) 11 11 12      Symptoms None None None       Comments Pt's first day of exercise in the CRP2 program. Reviewed METs and home Exercise Rx Reviewed METs     Duration Progress to 30 minutes of  aerobic without signs/symptoms of physical distress Progress to 30 minutes of  aerobic without signs/symptoms of physical distress Continue with 30 min of aerobic exercise without signs/symptoms of physical distress.     Intensity THRR unchanged THRR unchanged THRR unchanged       Progression   Progression Continue to progress workloads to maintain intensity without signs/symptoms of physical distress. Continue to progress workloads to maintain intensity without signs/symptoms of physical distress. Continue to progress workloads to maintain intensity without signs/symptoms of physical distress.     Average METs 2.3 2.6 2.9       Resistance Training   Training Prescription Yes No Yes     Weight 3 lbs --  No weights on Wednesday 4 lbs     Reps 10-15 -- 10-15     Time 10 Minutes -- 10 Minutes       Interval Training   Interval Training No No No       NuStep   Level 2 3 4      SPM 80 90 100     Minutes 15 15 15      METs 1.8 2.3 3       Track   Laps 16 16 16      Minutes 15 15 15      METs 2.86 2.86 2.86       Home Exercise Plan   Plans to continue exercise at -- Home (comment) Home (comment)     Frequency -- Add 2 additional days to program exercise sessions. Add 2 additional days to program exercise sessions.     Initial Home Exercises Provided -- 10/30/20 10/30/20            Exercise Comments:  Exercise Comments    Row Name 10/21/20 1452 10/30/20 1445         Exercise Comments Pt's first day of exercise in the CRP2 program. Tolerated exercise session well with no complaints. Reviwed METs and home exercise Rx. Pt verbalized understanding of the Rx and was provided a copy.             Exercise Goals and Review:  Exercise Goals    Row Name 10/17/20 1124             Exercise Goals   Increase Physical Activity Yes        Intervention Provide advice, education, support and counseling about physical activity/exercise needs.;Develop an individualized exercise prescription for aerobic and resistive training based on initial evaluation findings, risk stratification, comorbidities and participant's personal goals.       Expected Outcomes Short Term: Attend rehab on a regular basis to increase amount of physical activity.;Long Term: Add in home exercise to make exercise part of routine and to increase amount of physical activity.;Long Term: Exercising regularly at least 3-5 days a week.       Increase Strength and Stamina Yes       Intervention Provide advice, education, support and counseling about physical activity/exercise needs.;Develop an individualized exercise prescription for aerobic and resistive training based on initial evaluation findings, risk stratification, comorbidities and participant's personal goals.       Expected Outcomes  Short Term: Increase workloads from initial exercise prescription for resistance, speed, and METs.;Long Term: Improve cardiorespiratory fitness, muscular endurance and strength as measured by increased METs and functional capacity (6MWT);Short Term: Perform resistance training exercises routinely during rehab and add in resistance training at home       Able to understand and use rate of perceived exertion (RPE) scale Yes       Intervention Provide education and explanation on how to use RPE scale       Expected Outcomes Short Term: Able to use RPE daily in rehab to express subjective intensity level;Long Term:  Able to use RPE to guide intensity level when exercising independently       Knowledge and understanding of Target Heart Rate Range (THRR) Yes       Intervention Provide education and explanation of THRR including how the numbers were predicted and where they are located for reference       Expected Outcomes Short Term: Able to state/look up THRR;Short Term: Able to use daily as  guideline for intensity in rehab;Long Term: Able to use THRR to govern intensity when exercising independently       Able to check pulse independently Yes       Intervention Provide education and demonstration on how to check pulse in carotid and radial arteries.;Review the importance of being able to check your own pulse for safety during independent exercise       Expected Outcomes Short Term: Able to explain why pulse checking is important during independent exercise;Long Term: Able to check pulse independently and accurately       Understanding of Exercise Prescription Yes       Intervention Provide education, explanation, and written materials on patient's individual exercise prescription       Expected Outcomes Short Term: Able to explain program exercise prescription;Long Term: Able to explain home exercise prescription to exercise independently              Exercise Goals Re-Evaluation :  Exercise Goals Re-Evaluation    Row Name 10/21/20 1451 10/30/20 1445           Exercise Goal Re-Evaluation   Exercise Goals Review Increase Physical Activity;Increase Strength and Stamina;Able to understand and use rate of perceived exertion (RPE) scale;Knowledge and understanding of Target Heart Rate Range (THRR);Able to check pulse independently;Understanding of Exercise Prescription Increase Physical Activity;Increase Strength and Stamina;Able to understand and use rate of perceived exertion (RPE) scale;Knowledge and understanding of Target Heart Rate Range (THRR);Able to check pulse independently;Understanding of Exercise Prescription      Comments Pt's first day of exercise in the CRP2 program. Pt tolerated exercise session well with no complaints. Pt understands RPE scale, THRR and exercise Rx. Reviewed METs and home exercise Rx. Pt is not currently walking at home but will do it with his sister. Pt will walk 2x/week for 30 minutes. Pt verbalized understnading of home exercise Rx and was provided  a copy.      Expected Outcomes Will continue to monitor patient nad progress as tolerated. Pt will walk at home 2x/week for 30 minutes.             Discharge Exercise Prescription (Final Exercise Prescription Changes):  Exercise Prescription Changes - 11/11/20 1625      Response to Exercise   Blood Pressure (Admit) 128/68    Blood Pressure (Exercise) 140/80    Blood Pressure (Exit) 110/54    Heart Rate (Admit) 75 bpm    Heart Rate (  Exercise) 92 bpm    Heart Rate (Exit) 52 bpm    Rating of Perceived Exertion (Exercise) 12    Symptoms None    Comments Reviewed METs    Duration Continue with 30 min of aerobic exercise without signs/symptoms of physical distress.    Intensity THRR unchanged      Progression   Progression Continue to progress workloads to maintain intensity without signs/symptoms of physical distress.    Average METs 2.9      Resistance Training   Training Prescription Yes    Weight 4 lbs    Reps 10-15    Time 10 Minutes      Interval Training   Interval Training No      NuStep   Level 4    SPM 100    Minutes 15    METs 3      Track   Laps 16    Minutes 15    METs 2.86      Home Exercise Plan   Plans to continue exercise at Home (comment)    Frequency Add 2 additional days to program exercise sessions.    Initial Home Exercises Provided 10/30/20           Nutrition:  Target Goals: Understanding of nutrition guidelines, daily intake of sodium <1543m, cholesterol <2088m calories 30% from fat and 7% or less from saturated fats, daily to have 5 or more servings of fruits and vegetables.  Biometrics:  Pre Biometrics - 10/17/20 0915      Pre Biometrics   Waist Circumference 54.25 inches    Hip Circumference 51 inches    Waist to Hip Ratio 1.06 %    Triceps Skinfold 22 mm    % Body Fat 39.6 %    Grip Strength 42 kg    Flexibility 0 in   Pt could not reach box   Single Leg Stand 9.75 seconds            Nutrition Therapy Plan and  Nutrition Goals:   Nutrition Assessments:  Nutrition Assessments - 10/17/20 1443      MEDFICTS Scores   Pre Score 131          MEDIFICTS Score Key:  ?70 Need to make dietary changes   40-70 Heart Healthy Diet  ? 40 Therapeutic Level Cholesterol Diet    Picture Your Plate Scores:  <4<43nhealthy dietary pattern with much room for improvement.  41-50 Dietary pattern unlikely to meet recommendations for good health and room for improvement.  51-60 More healthful dietary pattern, with some room for improvement.   >60 Healthy dietary pattern, although there may be some specific behaviors that could be improved.    Nutrition Goals Re-Evaluation:   Nutrition Goals Re-Evaluation:   Nutrition Goals Discharge (Final Nutrition Goals Re-Evaluation):   Psychosocial: Target Goals: Acknowledge presence or absence of significant depression and/or stress, maximize coping skills, provide positive support system. Participant is able to verbalize types and ability to use techniques and skills needed for reducing stress and depression.  Initial Review & Psychosocial Screening:  Initial Psych Review & Screening - 10/17/20 1148      Initial Review   Current issues with History of Depression;Current Depression;Current Stress Concerns    Source of Stress Concerns Chronic Illness;Family;Occupation    [employer] currently going through a divorce. Ry[employer] custody with his wife. RyGatlyns currently depressed he is taking an antidepressant which somewhat controlls his depression      Family  Dynamics   Good Support System? Yes   Kyandre lives alone. Nicholos has a sister who lives near by who he has for support     Barriers   Psychosocial barriers to participate in program The patient should benefit from training in stress management and relaxation.      Screening Interventions   Interventions To provide support and resources with identified psychosocial needs;Encouraged to  exercise;Provide feedback about the scores to participant   Will forward to his quality of life questionniare to his wellness or primary care physician   Expected Outcomes Short Term goal: Utilizing psychosocial counselor, staff and physician to assist with identification of specific Stressors or current issues interfering with healing process. Setting desired goal for each stressor or current issue identified.;Long Term Goal: Stressors or current issues are controlled or eliminated.;Short Term goal: Identification and review with participant of any Quality of Life or Depression concerns found by scoring the questionnaire.;Long Term goal: The participant improves quality of Life and PHQ9 Scores as seen by post scores and/or verbalization of changes           Quality of Life Scores:  Quality of Life - 10/17/20 1112      Quality of Life   Select Quality of Life      Quality of Life Scores   Health/Function Pre 18.23 %    Socioeconomic Pre 18.86 %    Psych/Spiritual Pre 12.64 %    Family Pre 16.88 %    GLOBAL Pre 17.02 %          Scores of 19 and below usually indicate a poorer quality of life in these areas.  A difference of  2-3 points is a clinically meaningful difference.  A difference of 2-3 points in the total score of the Quality of Life Index has been associated with significant improvement in overall quality of life, self-image, physical symptoms, and general health in studies assessing change in quality of life.  PHQ-9: Recent Review Flowsheet Data    Depression screen [facility] 2/9 10/23/2020 10/17/2020 11/16/2019   Decreased Interest 0 1 1   Down, Depressed, Hopeless 0 1 1   PHQ - 2 Score 0 2 2   Altered sleeping - - 3   Tired, decreased energy - - 3   Change in appetite - - 2   Feeling bad or failure about yourself  - - 3   Trouble concentrating - - 1   Moving slowly or fidgety/restless - - 1   Suicidal thoughts - - 0   PHQ-9 Score - - 15   Difficult doing work/chores - -  Somewhat difficult     Interpretation of Total Score  Total Score Depression Severity:  1-4 = Minimal depression, 5-9 = Mild depression, 10-14 = Moderate depression, 15-19 = Moderately severe depression, 20-27 = Severe depression   Psychosocial Evaluation and Intervention:   Psychosocial Re-Evaluation:  Psychosocial Re-Evaluation    Row Name 10/23/20 1610 11/12/20 1629           Psychosocial Re-Evaluation   Current issues with Current Stress Concerns;Current Depression;History of Depression;Current Anxiety/Panic Current Stress Concerns;Current Depression;History of Depression;Current Anxiety/Panic      Comments Reviewed quality of life on 10/23/20. Says depression is better since starting cardiac rehab Mr. Gustafson continues to struggle with depression and anxiety but states it is getting much better since starting cardiac rehab. He is going through a divorce and shares custody with his wife. He is taking antidepressants which help control his symptoms.  No interventions needed at this time as patient feels he depression symptoms are stable. Worked with EP at most recent cardiac rehab session on stress management skills and relaxation stratagies.      Expected Outcomes Patient will have decreased depression and anxiety upon the completion of phase 2 cardiac rehab Patient will have decreased depression and anxiety upon the completion of phase 2 cardiac rehab      Interventions -- Encouraged to attend Cardiac Rehabilitation for the exercise;Stress management education;Relaxation education      Continue Psychosocial Services  Follow up required by staff Follow up required by staff      Comments Kourtland is currently going through a divorce. Tarrance shares custody with his wife. Deunta is currently depressed he is taking an antidepressant which somewhat controlls his depression --        Initial Review   Source of Stress Concerns Chronic Illness;Family --             Psychosocial Discharge (Final  Psychosocial Re-Evaluation):  Psychosocial Re-Evaluation - 11/12/20 1629      Psychosocial Re-Evaluation   Current issues with Current Stress Concerns;Current Depression;History of Depression;Current Anxiety/Panic    Comments Mr. Diltz continues to struggle with depression and anxiety but states it is getting much better since starting cardiac rehab. He is going through a divorce and shares custody with his wife. He is taking antidepressants which help control his symptoms. No interventions needed at this time as patient feels he depression symptoms are stable. Worked with EP at most recent cardiac rehab session on stress management skills and relaxation stratagies.    Expected Outcomes Patient will have decreased depression and anxiety upon the completion of phase 2 cardiac rehab    Interventions Encouraged to attend Cardiac Rehabilitation for the exercise;Stress management education;Relaxation education    Continue Psychosocial Services  Follow up required by staff           Vocational Rehabilitation: Provide vocational rehab assistance to qualifying candidates.   Vocational Rehab Evaluation & Intervention:  Vocational Rehab - 10/17/20 1157      Initial Vocational Rehab Evaluation & Intervention   Assessment shows need for Vocational Rehabilitation No   Morrell is currently working and does not need vocational rehab at this time          Education: Education Goals: Education classes will be provided on a weekly basis, covering required topics. Participant will state understanding/return demonstration of topics presented.  Learning Barriers/Preferences:  Learning Barriers/Preferences - 10/17/20 1116      Learning Barriers/Preferences   Learning Barriers Sight;Hearing   wears glasses   Learning Preferences Computer/Internet;Pictoral;Skilled Demonstration           Education Topics: Count Your Pulse:  -Group instruction provided by verbal instruction, demonstration, patient  participation and written materials to support subject.  Instructors address importance of being able to find your pulse and how to count your pulse when at home without a heart monitor.  Patients get hands on experience counting their pulse with staff help and individually.   Heart Attack, Angina, and Risk Factor Modification:  -Group instruction provided by verbal instruction, video, and written materials to support subject.  Instructors address signs and symptoms of angina and heart attacks.    Also discuss risk factors for heart disease and how to make changes to improve heart health risk factors.   Functional Fitness:  -Group instruction provided by verbal instruction, demonstration, patient participation, and written materials to support subject.  Instructors address safety  measures for doing things around the house.  Discuss how to get up and down off the floor, how to pick things up properly, how to safely get out of a chair without assistance, and balance training.   Meditation and Mindfulness:  -Group instruction provided by verbal instruction, patient participation, and written materials to support subject.  Instructor addresses importance of mindfulness and meditation practice to help reduce stress and improve awareness.  Instructor also leads participants through a meditation exercise.    Stretching for Flexibility and Mobility:  -Group instruction provided by verbal instruction, patient participation, and written materials to support subject.  Instructors lead participants through series of stretches that are designed to increase flexibility thus improving mobility.  These stretches are additional exercise for major muscle groups that are typically performed during regular warm up and cool down.   Hands Only CPR:  -Group verbal, video, and participation provides a basic overview of AHA guidelines for community CPR. Role-play of emergencies allow participants the opportunity to  practice calling for help and chest compression technique with discussion of AED use.   Hypertension: -Group verbal and written instruction that provides a basic overview of hypertension including the most recent diagnostic guidelines, risk factor reduction with self-care instructions and medication management.    Nutrition I class: Heart Healthy Eating:  -Group instruction provided by PowerPoint slides, verbal discussion, and written materials to support subject matter. The instructor gives an explanation and review of the Therapeutic Lifestyle Changes diet recommendations, which includes a discussion on lipid goals, dietary fat, sodium, fiber, plant stanol/sterol esters, sugar, and the components of a well-balanced, healthy diet.   Nutrition II class: Lifestyle Skills:  -Group instruction provided by PowerPoint slides, verbal discussion, and written materials to support subject matter. The instructor gives an explanation and review of label reading, grocery shopping for heart health, heart healthy recipe modifications, and ways to make healthier choices when eating out.   Diabetes Question & Answer:  -Group instruction provided by PowerPoint slides, verbal discussion, and written materials to support subject matter. The instructor gives an explanation and review of diabetes co-morbidities, pre- and post-prandial blood glucose goals, pre-exercise blood glucose goals, signs, symptoms, and treatment of hypoglycemia and hyperglycemia, and foot care basics.   Diabetes Blitz:  -Group instruction provided by PowerPoint slides, verbal discussion, and written materials to support subject matter. The instructor gives an explanation and review of the physiology behind type 1 and type 2 diabetes, diabetes medications and rational behind using different medications, pre- and post-prandial blood glucose recommendations and Hemoglobin A1c goals, diabetes diet, and exercise including blood glucose guidelines  for exercising safely.    Portion Distortion:  -Group instruction provided by PowerPoint slides, verbal discussion, written materials, and food models to support subject matter. The instructor gives an explanation of serving size versus portion size, changes in portions sizes over the last 20 years, and what consists of a serving from each food group.   Stress Management:  -Group instruction provided by verbal instruction, video, and written materials to support subject matter.  Instructors review role of stress in heart disease and how to cope with stress positively.     Exercising on Your Own:  -Group instruction provided by verbal instruction, power point, and written materials to support subject.  Instructors discuss benefits of exercise, components of exercise, frequency and intensity of exercise, and end points for exercise.  Also discuss use of nitroglycerin and activating EMS.  Review options of places to exercise outside of rehab.  Review guidelines for sex with heart disease.   Cardiac Drugs I:  -Group instruction provided by verbal instruction and written materials to support subject.  Instructor reviews cardiac drug classes: antiplatelets, anticoagulants, beta blockers, and statins.  Instructor discusses reasons, side effects, and lifestyle considerations for each drug class.   Cardiac Drugs II:  -Group instruction provided by verbal instruction and written materials to support subject.  Instructor reviews cardiac drug classes: angiotensin converting enzyme inhibitors (ACE-I), angiotensin II receptor blockers (ARBs), nitrates, and calcium channel blockers.  Instructor discusses reasons, side effects, and lifestyle considerations for each drug class.   Anatomy and Physiology of the Circulatory System:  Group verbal and written instruction and models provide basic cardiac anatomy and physiology, with the coronary electrical and arterial systems. Review of: AMI, Angina, Valve  disease, Heart Failure, Peripheral Artery Disease, Cardiac Arrhythmia, Pacemakers, and the ICD.   Other Education:  -Group or individual verbal, written, or video instructions that support the educational goals of the cardiac rehab program.   Holiday Eating Survival Tips:  -Group instruction provided by PowerPoint slides, verbal discussion, and written materials to support subject matter. The instructor gives patients tips, tricks, and techniques to help them not only survive but enjoy the holidays despite the onslaught of food that accompanies the holidays.   Knowledge Questionnaire Score:  Knowledge Questionnaire Score - 10/17/20 1115      Knowledge Questionnaire Score   Pre Score 21/24           Core Components/Risk Factors/Patient Goals at Admission:  Personal Goals and Risk Factors at Admission - 10/17/20 1118      Core Components/Risk Factors/Patient Goals on Admission    Weight Management Yes;Obesity;Weight Loss    Intervention Weight Management: Develop a combined nutrition and exercise program designed to reach desired caloric intake, while maintaining appropriate intake of nutrient and fiber, sodium and fats, and appropriate energy expenditure required for the weight goal.;Weight Management: Provide education and appropriate resources to help participant work on and attain dietary goals.;Weight Management/Obesity: Establish reasonable short term and long term weight goals.;Obesity: Provide education and appropriate resources to help participant work on and attain dietary goals.    Admit Weight 272 lb 11.3 oz (123.7 kg)    Expected Outcomes Short Term: Continue to assess and modify interventions until short term weight is achieved;Long Term: Adherence to nutrition and physical activity/exercise program aimed toward attainment of established weight goal;Weight Maintenance: Understanding of the daily nutrition guidelines, which includes 25-35% calories from fat, 7% or less cal from  saturated fats, less than 211m cholesterol, less than 1.5gm of sodium, & 5 or more servings of fruits and vegetables daily;Weight Loss: Understanding of general recommendations for a balanced deficit meal plan, which promotes 1-2 lb weight loss per week and includes a negative energy balance of [phone] kcal/d;Understanding recommendations for meals to include 15-35% energy as protein, 25-35% energy from fat, 35-60% energy from carbohydrates, less than 2038mof dietary cholesterol, 20-35 gm of total fiber daily;Understanding of distribution of calorie intake throughout the day with the consumption of 4-5 meals/snacks    Hypertension Yes    Intervention Provide education on lifestyle modifcations including regular physical activity/exercise, weight management, moderate sodium restriction and increased consumption of fresh fruit, vegetables, and low fat dairy, alcohol moderation, and smoking cessation.;Monitor prescription use compliance.    Expected Outcomes Short Term: Continued assessment and intervention until BP is < 140/9062mG in hypertensive participants. < 130/45m60m in hypertensive participants with diabetes, heart failure or chronic  kidney disease.;Long Term: Maintenance of blood pressure at goal levels.    Lipids Yes    Intervention Provide education and support for participant on nutrition & aerobic/resistive exercise along with prescribed medications to achieve LDL <42m, HDL >437m    Expected Outcomes Short Term: Participant states understanding of desired cholesterol values and is compliant with medications prescribed. Participant is following exercise prescription and nutrition guidelines.;Long Term: Cholesterol controlled with medications as prescribed, with individualized exercise RX and with personalized nutrition plan. Value goals: LDL < 7068mHDL > 40 mg.    Stress Yes    Intervention Offer individual and/or small group education and counseling on adjustment to heart disease, stress  management and health-related lifestyle change. Teach and support self-help strategies.;Refer participants experiencing significant psychosocial distress to appropriate mental health specialists for further evaluation and treatment. When possible, include family members and significant others in education/counseling sessions.    Expected Outcomes Short Term: Participant demonstrates changes in health-related behavior, relaxation and other stress management skills, ability to obtain effective social support, and compliance with psychotropic medications if prescribed.;Long Term: Emotional wellbeing is indicated by absence of clinically significant psychosocial distress or social isolation.           Core Components/Risk Factors/Patient Goals Review:   Goals and Risk Factor Review    Row Name 10/23/20 1623 11/12/20 1633           Core Components/Risk Factors/Patient Goals Review   Personal Goals Review Weight Management/Obesity;Hypertension;Lipids;Stress Weight Management/Obesity;Hypertension;Lipids;Stress      Review RyaMakiaharted exercise on 10/21/26. Off to a good start to exercise RyaJaans multiple CAD risk factors. His stress is related to currently going through a divorce and sharing custody of his child. He is actively taking medications for depression and has participated in stress reduction/relaxation education in [facility] and from outside sources. He continues to slowly loose weight and working with [facility]. His BP is well controlled. He has modified his diet to include lipid lowering stratagies and foods. No current labs available for viewing lipid panel as patient's PCP is not in the [facility] system. Care everywhere did not provide any labs. Mr. AdaYasuda on target to increase his stamina and strength, feel better, have more energy, and make healty habits by discharge 12/13/20. He should also continue to slowly work towards his long term goal of weight loss  with a weight reduction to 240 lbs.      Expected Outcomes RyaSkandall continue to partcipate in phase 2 cardiac rehab for exercise, nutrtion and lifestyle modifications lbs will continue to partcipate in phase 2 cardiac rehab for exercise, nutrtion and lifestyle modifications             Core Components/Risk Factors/Patient Goals at Discharge (Final Review):   Goals and Risk Factor Review - 11/12/20 1633      Core Components/Risk Factors/Patient Goals Review   Personal Goals Review Weight Management/Obesity;Hypertension;Lipids;Stress    Review RyaFinneass multiple CAD risk factors. His stress is related to currently going through a divorce and sharing custody of his child. He is actively taking medications for depression and has participated in stress reduction/relaxation education in [facility] and from outside sources. He continues to slowly loose weight and working with [facility]. His BP is well controlled. He has modified his diet to include lipid lowering stratagies and foods. No current labs available for viewing lipid panel as patient's [facility]CP [facility] not [facility]. Care everywhere did not provide any labs. Mr. Fluegge is on target to increase his stamina and strength, feel better, have more energy, and make healty habits by discharge 12/13/20. He should also continue to slowly work towards his long term goal of weight loss with a weight reduction to 240 lbs.    Expected Outcomes lbs will continue to partcipate in phase 2 cardiac rehab for exercise, nutrtion and lifestyle modifications           ITP Comments:  ITP Comments    Row Name 10/15/20 1138 10/17/20 1143 10/23/20 1609 11/12/20 1625     ITP Comments Dr Fransico Him MD, Medical Director Dr Fransico Him MD, Medical Director 30 Day ITP Review. Trayvion started exercise on 10/21/20 off to a good start to exercise Keontay has completed 10 cardiac rehab exercise sessions as of 11/12/20. He consistantly works to an  RPE of 11-13 and tolerates work load increases. His goals are to increase his stamina and strength and to feel better. He also hopes to gain more energy and make healthy habits. He is currently working with [facility] for wt loss. Current wt 270. Admission wt 272.6.           Comments: See ITP comments

## 2020-11-19 NOTE — Progress Notes (Signed)
Chief Complaint:   OBESITY Hector [name] is here to discuss his progress with his obesity treatment plan along with follow-up of his obesity related diagnoses.   Today's visit was #: 65 Starting weight: 282 lbs Starting date: 01/30/2020 Today's weight: 270 lbs Today's date: 11/14/2020 Total lbs lost to date: 12 lbs Body mass index is 39.87 kg/m.  Total weight loss percentage to date: -4.26%  Interim History: Hector [name] says he is having more stress and has been doing more stress eating.  He likes sweet and salty foods.  He says that the low FODMAP diet helped with the diarrhea, belching, and burning.  Today's bioimpedance results indicate that Hector [name] has gained 11 pounds of water weight since his last visit.  Nutrition Plan: the Category 2 Plan for 30% of the time.  Anti-obesity medications: Wegovy.  Hunger is well controlled controlled. Cravings are poorly controlled controlled.  Activity: Cardiac rehab for 1 hour 3 times per week. Stress: Increased.  Assessment/Plan:   1. Cellulitis of right lower extremity Hector [name] has developed cellulitis of his right leg. Mild but concerning for spread. Will treat as below. Follow up with PCP if not improving.   Plan:  Start Keflex and mupirocin ointment, as per below.  - Start cephALEXin (KEFLEX) 500 MG capsule; Take 1 capsule (500 mg total) by mouth 4 (four) times daily for 10 days.  Dispense: 40 capsule; Refill: 0 - Start mupirocin ointment (BACTROBAN) 2 %; Apply 1 application topically 2 (two) times daily.  Dispense: 30 g; Refill: 2  2. Coronary artery disease involving native coronary artery of native heart without angina pectoris, s/p coronary angioplasty with stent Hector [name] says that rehab is going well.  He is enjoying it.  Plan:  Continue going to Cardiac Rehab.  3. Lower extremity edema Hector [name] takes HCTZ as needed for lower extremity edema.  Plan:  Will refill HCTZ today, as per below.  - Refill hydrochlorothiazide (MICROZIDE) 12.5 MG capsule;  Take 1 capsule (12.5 mg total) by mouth daily as needed (edema).  Dispense: 30 capsule; Refill: 0  4. Chronic diarrhea Hector [name] has been having chronic diarrhea, but he says the low FODMAP diet has helped.  5. Anxiety, with emotional eating Hector [name] is taking Prozac 10 mg daily and Ativan for anxiety. He is still struggling with anxiety.   Plan:  Will place referral to Psychology today.  - Ambulatory referral to Psychology  6. At risk for dehydration Hector [name] was given approximately 15 minutes dehydration prevention counseling today. Hector [name] is at risk for dehydration if the diarrhea continues. He was encouraged to hydrate and monitor fluid status to avoid dehydration as well as weight loss plateaus.   7. Class 3 severe obesity with serious comorbidity and body mass index (BMI) of 40.0 to 44.9 in adult, unspecified obesity type (Hector [name])  Course: Hector [name] is currently in the action stage of change. As such, his goal is to continue with weight loss efforts.   Nutrition goals: He has agreed to the Category 2 Plan, low FODMAP.   Exercise goals: Cardiac rehab.  Behavioral modification strategies: emotional eating strategies.  Hector [name] has agreed to follow-up with our clinic in 3 weeks. He was informed of the importance of frequent follow-up visits to maximize his success with intensive lifestyle modifications for his multiple health conditions.   Hector [name] was informed we would discuss his lab results at his next visit unless there is a critical issue that needs to be addressed sooner. Hector [name] agreed to keep his next visit at  the agreed upon time to discuss these results.  Objective:   Blood pressure 132/81, pulse 63, temperature 98.5 F (36.9 C), height 5' 9"  (1.753 m), weight 270 lb (122.5 kg), SpO2 100 %. Body mass index is 39.87 kg/m.  General: Cooperative, alert, well developed, in no acute distress. HEENT: Conjunctivae and lids unremarkable. Cardiovascular: Regular rhythm.  Lungs: Normal work of  breathing. Neurologic: No focal deficits.   Lab Results  Component Value Date   CREATININE 1.32 09/26/2020   BUN 22 09/26/2020   NA 142 09/26/2020   K 4.2 09/26/2020   CL 112 (H) 09/26/2020   CO2 19 (L) 09/26/2020   Lab Results  Component Value Date   ALT 21 11/16/2019   AST 18 11/16/2019   ALKPHOS 89 11/16/2019   BILITOT 0.3 11/16/2019   Lab Results  Component Value Date   HGBA1C 5.2 11/16/2019   Lab Results  Component Value Date   INSULIN 35.9 (H) 11/16/2019   Lab Results  Component Value Date   TSH 2.410 11/16/2019   Lab Results  Component Value Date   CHOL 136 11/16/2019   HDL 35 (L) 11/16/2019   LDLCALC 81 11/16/2019   TRIG 105 11/16/2019   CHOLHDL 3 06/12/2019   Lab Results  Component Value Date   WBC 4.5 08/07/2020   HGB 15.2 08/07/2020   HCT 44.2 08/07/2020   MCV 85.8 08/07/2020   PLT 180 08/07/2020   Attestation Statements:   Reviewed by clinician on day of visit: allergies, medications, problem list, medical history, surgical history, family history, social history, and previous encounter notes.  I, Water quality scientist, CMA, am acting as transcriptionist for Briscoe Deutscher, DO  I have reviewed the above documentation for accuracy and completeness, and I agree with the above. Briscoe Deutscher, DO

## 2020-11-20 ENCOUNTER — Encounter (HOSPITAL_COMMUNITY): Payer: No Typology Code available for payment source

## 2020-11-22 ENCOUNTER — Encounter (HOSPITAL_COMMUNITY): Payer: No Typology Code available for payment source

## 2020-11-25 ENCOUNTER — Encounter (HOSPITAL_COMMUNITY): Payer: No Typology Code available for payment source

## 2020-11-25 ENCOUNTER — Telehealth (HOSPITAL_COMMUNITY): Payer: Self-pay | Admitting: Family Medicine

## 2020-11-27 ENCOUNTER — Encounter (HOSPITAL_COMMUNITY)
Admission: RE | Admit: 2020-11-27 | Discharge: 2020-11-27 | Disposition: A | Discharge status: Home / Self Care | Payer: No Typology Code available for payment source | Source: Ambulatory Visit | Attending: Interventional Cardiology | Admitting: Interventional Cardiology

## 2020-11-27 ENCOUNTER — Other Ambulatory Visit: Payer: Self-pay

## 2020-11-27 ENCOUNTER — Telehealth: Payer: Self-pay

## 2020-11-27 DIAGNOSIS — Z955 Presence of coronary angioplasty implant and graft: Secondary | ICD-10-CM | POA: Insufficient documentation

## 2020-11-27 NOTE — Telephone Encounter (Signed)
Left message advising patient to finish cologuard collection as we received information that they have not received the specimen.

## 2020-11-28 ENCOUNTER — Encounter: Payer: Self-pay | Admitting: Family Medicine

## 2020-11-29 ENCOUNTER — Encounter (HOSPITAL_COMMUNITY)
Admission: RE | Admit: 2020-11-29 | Discharge: 2020-11-29 | Disposition: A | Discharge status: Home / Self Care | Payer: No Typology Code available for payment source | Source: Ambulatory Visit | Attending: Interventional Cardiology | Admitting: Interventional Cardiology

## 2020-11-29 ENCOUNTER — Other Ambulatory Visit: Payer: Self-pay

## 2020-11-29 ENCOUNTER — Other Ambulatory Visit: Payer: Self-pay | Admitting: Family Medicine

## 2020-11-29 ENCOUNTER — Encounter: Payer: Self-pay | Admitting: Family Medicine

## 2020-11-29 DIAGNOSIS — Z955 Presence of coronary angioplasty implant and graft: Secondary | ICD-10-CM | POA: Diagnosis not present

## 2020-12-01 NOTE — Progress Notes (Signed)
Cardiology Office Note   Date:  12/02/2020   ID:  Hector [name], DOB Oct 17, 1971, MRN [id number]  PCP:  [name], MD    No chief complaint on file.  CAD  Wt Readings from Last 3 Encounters:  12/02/20 280 lb 9.6 oz (127.3 kg)  11/14/20 270 lb (122.5 kg)  10/29/20 261 lb (118.4 kg)       History of Present Illness: Hector [name] is a 49 y.o. male who I saw in August 2021.  He was having symptoms c/w unstable angina and cardiac catheterization was arranged.  Cardiac cath in August 2021 showed:  "Mid LAD lesion is 95% stenosed. Right to left collaterals.  A drug-eluting stent was successfully placed using a STENT RESOLUTE ONYX 3.0X30, postdilated to > 4 mm in the mid and proximal stent and optimized with IVUS.  Post intervention, there is a 0% residual stenosis.  Prox RCA lesion is 30% stenosed. Minimal circumflex disease.  The left ventricular systolic function is normal.  LV end diastolic pressure is normal.  The left ventricular ejection fraction is 55-65% by visual estimate.  There is no aortic valve stenosis.  Prox LAD lesion is 20% stenosed.   Severe single vessel CAD.   Due to his symptoms of unstable angina as an outpatient and the severity of the lesion, I elected to use Brilinta.  This could be changed to clopidogrel after 30 days if there are issues with Brilinta.  We will need to restart high-dose statin.  We spoke about lifestyle changes as well including avoiding smoking, healthy diet and regular exercise.  Cardiac rehab will be beneficial."  At follow-up in September 2021, he was doing well.  He noted bruising.  He was supposed to start semaglutide to help with weight loss.  Since the last visit, bruising has improved.   Denies : Chest pain. Dizziness. Leg edema. Nitroglycerin use. Orthopnea. Palpitations. Paroxysmal nocturnal dyspnea.  Syncope.   He is doing cardiac rehab.  He feels well there. Struggling with his diet.    He thought  he had some side effects from atorvastatin, 16 days of diarrhea.  He decreased atorvastatin but then feels he can go back to 80 mg.   He changed his diet and diarrhea got better.   He has had his COVID booster.   Past Medical History:  Diagnosis Date  . Alcohol abuse   . Allergy   . Anal fissure   . Anxiety   . Asthma   . Back pain   . Bilateral swelling of feet   . Blood in stool   . Chest pain   . Coronary artery disease   . Depression   . Drug use   . Febrile seizure ([place])   . Heartburn   . Hyperlipidemia   . Hypertension   . Irritable bowel syndrome with diarrhea   . Jaundice, newborn   . Palpitations   . Panic   . Panic disorder   . Pre-diabetes   . Seizures ([facility])    febrile  . Shortness of breath   . Sleep apnea     Past Surgical History:  Procedure Laterality Date  . CARDIAC CATHETERIZATION    . CORONARY STENT INTERVENTION N/A 08/19/2020   Procedure: CORONARY STENT INTERVENTION;  Surgeon: Jettie Booze, MD;  Location: [facility];  Service: Cardiovascular;  Laterality: N/A;  . HEMORRHOID BANDING    . INTRAVASCULAR ULTRASOUND/IVUS N/A 08/19/2020   Procedure: Intravascular Ultrasound/IVUS;  Surgeon:  Jettie Booze, MD;  Location: [facility];  Service: Cardiovascular;  Laterality: N/A;  . LEFT HEART CATH AND CORONARY ANGIOGRAPHY N/A 08/19/2020   Procedure: LEFT HEART CATH AND CORONARY ANGIOGRAPHY;  Surgeon: Jettie Booze, MD;  Location: [facility];  Service: Cardiovascular;  Laterality: N/A;  . WISDOM TOOTH EXTRACTION  1991     Current Outpatient Medications  Medication Sig Dispense Refill  . aspirin EC 81 MG tablet Take 1 tablet (81 mg total) by mouth daily. Swallow whole. 150 tablet 2  . atorvastatin (LIPITOR) 80 MG tablet Take 1 tablet (80 mg total) by mouth daily. 90 tablet 3  . FLUoxetine (PROZAC) 10 MG capsule TAKE 1 CAPSULE DAILY 90 capsule 3  . hydrochlorothiazide (MICROZIDE) 12.5 MG capsule Take 1 capsule (12.5  mg total) by mouth daily as needed (edema). 30 capsule 0  . LORazepam (ATIVAN) 1 MG tablet TAKE 1 TABLET (1 MG TOTAL) BY MOUTH EVERY 8 (EIGHT) HOURS AS NEEDED FOR ANXIETY. 30 tablet 0  . losartan (COZAAR) 50 MG tablet Take 1 tablet (50 mg total) by mouth daily. 90 tablet 3  . mupirocin ointment (BACTROBAN) 2 % Apply 1 application topically 2 (two) times daily. 30 g 2  . nebivolol (BYSTOLIC) 10 MG tablet TAKE 1 TABLET BY MOUTH ONCE DAILY 90 tablet 3  . Semaglutide-Weight Management (WEGOVY) 0.25 MG/0.5ML SOAJ Inject 0.25 mg into the skin once a week. 2 mL 0  . ticagrelor (BRILINTA) 90 MG TABS tablet Take 1 tablet (90 mg total) by mouth 2 (two) times daily. 180 tablet 3   No current facility-administered medications for this visit.    Allergies:   Patient has no known allergies.    Social History:  The patient  reports that he has never smoked. He has never used smokeless tobacco. He reports current alcohol use. He reports current drug use. Drug: Marijuana.   Family History:  The patient's family history includes Basal cell carcinoma in his mother; Cancer in his mother; Colitis in his brother; Colon polyps in his brother and father; Dementia in his mother; Depression in his father and mother; Diabetes in his father; Heart attack in his father; Heart disease (age of onset: 74) in his father; Hyperlipidemia in his father; Hypertension in his father and mother; Obesity in his father; Skin cancer in his mother; Sleep apnea in his father; Squamous cell carcinoma in his mother; Stroke in his father and mother.    ROS:  Please see the history of present illness.   Otherwise, review of systems are positive for weight gain.   All other systems are reviewed and negative.    PHYSICAL EXAM: VS:  BP 138/86   Pulse 66   Ht 5' 9"  (1.753 m)   Wt 280 lb 9.6 oz (127.3 kg)   SpO2 96%   BMI 41.44 kg/m  , BMI Body mass index is 41.44 kg/m. GEN: Well nourished, well developed, in no acute distress  HEENT:  normal  Neck: no JVD, carotid bruits, or masses Cardiac: RRR; no murmurs, rubs, or gallops,; 1+ LE edema  Respiratory:  clear to auscultation bilaterally, normal work of breathing GI: soft, nontender, nondistended, + BS, obese MS: no deformity or atrophy  Skin: warm and dry, no rash Neuro:  Strength and sensation are intact Psych: euthymic mood, full affect   EKG:   The ekg ordered 9/1 demonstrates sinus bradycardia, no ST changes   Recent Labs: 08/07/2020: Hemoglobin 15.2; Platelets 180 09/26/2020: BUN 22; Creat  1.32; Potassium 4.2; Sodium 142   Lipid Panel    Component Value Date/Time   CHOL 136 11/16/2019 1007   TRIG 105 11/16/2019 1007   HDL 35 (L) 11/16/2019 1007   CHOLHDL 3 06/12/2019 1059   VLDL 12.2 06/12/2019 1059   LDLCALC 81 11/16/2019 1007     Other studies Reviewed: Additional studies/ records that were reviewed today with results demonstrating: 10/2019 LDL 81.   ASSESSMENT AND PLAN:  1.   CAD: Patient with known aggressive family history of coronary artery disease.  He is status post LAD stent.  Could switch to clopidogrel monotherapy at any point, but particularly if there are significant bleeding issues.  We discussed his bruising.  Feels SHOB that he thinks is related to Brilinta.   2.   HTN: Low-salt diet and regular exercise recommended. 3.   Hyperlipidemia: Whole food, plant-based diet recommended.  Check lipids today.  Ok to continue atorvastatin 40 mg daily. 4.   Morbid obesity: Regular exercise and dietary changes.  Semaglutide should be helpful.  Working with a weight loss clinic.   5. LE edema: OK to use Lasix 40 mg PO daily prn. I suspect he could get an effect from starting at once a week.  Stop HCTZ.    Current medicines are reviewed at length with the patient today.  The patient concerns regarding his medicines were addressed.  The following changes have been made:  No change  Labs/ tests ordered today include: lipids No orders of the defined  types were placed in this encounter.   Recommend 150 minutes/week of aerobic exercise Low fat, low carb, high fiber diet recommended  Disposition:   FU in 6 months   Signed, Larae Grooms, MD  12/02/2020 9:15 AM    [address], [place], [place]  [address] Phone: [phone]; Fax: [phone]

## 2020-12-02 ENCOUNTER — Other Ambulatory Visit: Payer: Self-pay

## 2020-12-02 ENCOUNTER — Encounter: Payer: Self-pay | Admitting: Interventional Cardiology

## 2020-12-02 ENCOUNTER — Telehealth: Payer: Self-pay

## 2020-12-02 ENCOUNTER — Encounter (HOSPITAL_COMMUNITY)
Admission: RE | Admit: 2020-12-02 | Discharge: 2020-12-02 | Disposition: A | Discharge status: Home / Self Care | Payer: No Typology Code available for payment source | Source: Ambulatory Visit | Attending: Interventional Cardiology | Admitting: Interventional Cardiology

## 2020-12-02 ENCOUNTER — Ambulatory Visit: Payer: No Typology Code available for payment source | Admitting: Interventional Cardiology

## 2020-12-02 VITALS — BP 138/86 | HR 66 | Ht 69.0 in | Wt 280.6 lb

## 2020-12-02 DIAGNOSIS — Z955 Presence of coronary angioplasty implant and graft: Secondary | ICD-10-CM | POA: Diagnosis not present

## 2020-12-02 DIAGNOSIS — E782 Mixed hyperlipidemia: Secondary | ICD-10-CM | POA: Diagnosis not present

## 2020-12-02 DIAGNOSIS — I2510 Atherosclerotic heart disease of native coronary artery without angina pectoris: Secondary | ICD-10-CM

## 2020-12-02 DIAGNOSIS — Z8249 Family history of ischemic heart disease and other diseases of the circulatory system: Secondary | ICD-10-CM

## 2020-12-02 DIAGNOSIS — Z95820 Peripheral vascular angioplasty status with implants and grafts: Secondary | ICD-10-CM

## 2020-12-02 DIAGNOSIS — I10 Essential (primary) hypertension: Secondary | ICD-10-CM

## 2020-12-02 LAB — LIPID PANEL

## 2020-12-02 MED ORDER — FUROSEMIDE 40 MG PO TABS: 40.0000 mg | ORAL_TABLET | Freq: Every day | ORAL | 0 refills | Status: DC | PRN

## 2020-12-02 MED ORDER — FUROSEMIDE 40 MG PO TABS: 40.0000 mg | ORAL_TABLET | Freq: Every day | ORAL | 3 refills | Status: DC

## 2020-12-02 NOTE — Telephone Encounter (Signed)
Called and spoke to patient. Made him aware that Dr. Irish Lack would like for him to stop HCTZ and start lasix 40 mg QD PRN. Patient verbalized understanding. Rx sent to preferred pharmacy.

## 2020-12-02 NOTE — Patient Instructions (Signed)
Medication Instructions:  Your physician recommends that you continue on your current medications as directed. Please refer to the Current Medication list given to you today.  *If you need a refill on your cardiac medications before your next appointment, please call your pharmacy*   Lab Work: TODAY: CBC, CMET, LIPIDS  If you have labs (blood work) drawn today and your tests are completely normal, you will receive your results only by: Marland Kitchen MyChart Message (if you have MyChart) OR . A paper copy in the mail If you have any lab test that is abnormal or we need to change your treatment, we will call you to review the results.   Testing/Procedures: None   Follow-Up: At [facility], you and your health needs are our priority.  As part of our continuing mission to provide you with exceptional heart care, we have created designated Provider Care Teams.  These Care Teams include your primary Cardiologist (physician) and Advanced Practice Providers (APPs -  Physician Assistants and Nurse Practitioners) who all work together to provide you with the care you need, when you need it.  We recommend signing up for the patient portal called "MyChart".  Sign up information is provided on this After Visit Summary.  MyChart is used to connect with patients for Virtual Visits (Telemedicine).  Patients are able to view lab/test results, encounter notes, upcoming appointments, etc.  Non-urgent messages can be sent to your provider as well.   To learn more about what you can do with MyChart, go to [email or web address].    Your next appointment:   6 month(s)  The format for your next appointment:   In Person  Provider:   You may see Larae Grooms, MD or one of the following Advanced Practice Providers on your designated Care Team:    Melina Copa, PA-C  Ermalinda Barrios, PA-C    Other Instructions None

## 2020-12-03 LAB — COMPREHENSIVE METABOLIC PANEL
ALT: 18 IU/L (ref 0–44)
AST: 16 IU/L (ref 0–40)
Albumin/Globulin Ratio: 1.8 (ref 1.2–2.2)
Albumin: 4.2 g/dL (ref 4.0–5.0)
Alkaline Phosphatase: 71 IU/L (ref 44–121)
BUN/Creatinine Ratio: 22 — ABNORMAL HIGH (ref 9–20)
BUN: 25 mg/dL — ABNORMAL HIGH (ref 6–24)
Bilirubin Total: 0.4 mg/dL (ref 0.0–1.2)
CO2: 21 mmol/L (ref 20–29)
Calcium: 8.8 mg/dL (ref 8.7–10.2)
Chloride: 107 mmol/L — ABNORMAL HIGH (ref 96–106)
Creatinine, Ser: 1.16 mg/dL (ref 0.76–1.27)
GFR calc Af Amer: 85 mL/min/{1.73_m2} (ref >59)
GFR calc non Af Amer: 74 mL/min/{1.73_m2} (ref >59)
Globulin, Total: 2.4 g/dL (ref 1.5–4.5)
Glucose: 106 mg/dL — ABNORMAL HIGH (ref 65–99)
Potassium: 4.2 mmol/L (ref 3.5–5.2)
Sodium: 141 mmol/L (ref 134–144)
Total Protein: 6.6 g/dL (ref 6.0–8.5)

## 2020-12-03 LAB — LIPID PANEL
Chol/HDL Ratio: 4 ratio (ref 0.0–5.0)
Cholesterol, Total: 135 mg/dL (ref 100–199)
HDL: 34 mg/dL — ABNORMAL LOW (ref >39)
LDL Chol Calc (NIH): 83 mg/dL (ref 0–99)
Triglycerides: 97 mg/dL (ref 0–149)
VLDL Cholesterol Cal: 18 mg/dL (ref 5–40)

## 2020-12-03 LAB — CBC
Hematocrit: 44.4 % (ref 37.5–51.0)
Hemoglobin: 15 g/dL (ref 13.0–17.7)
MCH: 30 pg (ref 26.6–33.0)
MCHC: 33.8 g/dL (ref 31.5–35.7)
MCV: 89 fL (ref 79–97)
Platelets: 166 10*3/uL (ref 150–450)
RBC: 5 x10E6/uL (ref 4.14–5.80)
RDW: 13.4 % (ref 11.6–15.4)
WBC: 7 10*3/uL (ref 3.4–10.8)

## 2020-12-03 LAB — COLOGUARD

## 2020-12-04 ENCOUNTER — Encounter (HOSPITAL_COMMUNITY)
Admission: RE | Admit: 2020-12-04 | Discharge: 2020-12-04 | Disposition: A | Discharge status: Home / Self Care | Payer: No Typology Code available for payment source | Source: Ambulatory Visit | Attending: Interventional Cardiology | Admitting: Interventional Cardiology

## 2020-12-04 ENCOUNTER — Other Ambulatory Visit: Payer: Self-pay

## 2020-12-04 DIAGNOSIS — Z955 Presence of coronary angioplasty implant and graft: Secondary | ICD-10-CM

## 2020-12-05 ENCOUNTER — Ambulatory Visit (INDEPENDENT_AMBULATORY_CARE_PROVIDER_SITE_OTHER): Payer: No Typology Code available for payment source | Admitting: Family Medicine

## 2020-12-05 ENCOUNTER — Encounter (INDEPENDENT_AMBULATORY_CARE_PROVIDER_SITE_OTHER): Payer: Self-pay | Admitting: Family Medicine

## 2020-12-05 ENCOUNTER — Other Ambulatory Visit: Payer: Self-pay

## 2020-12-05 VITALS — BP 147/81 | HR 47 | Temp 97.9°F | Ht 69.0 in | Wt 275.0 lb

## 2020-12-05 DIAGNOSIS — E7849 Other hyperlipidemia: Secondary | ICD-10-CM | POA: Diagnosis not present

## 2020-12-05 DIAGNOSIS — I10 Essential (primary) hypertension: Secondary | ICD-10-CM | POA: Diagnosis not present

## 2020-12-05 DIAGNOSIS — E65 Localized adiposity: Secondary | ICD-10-CM | POA: Diagnosis not present

## 2020-12-05 DIAGNOSIS — I2510 Atherosclerotic heart disease of native coronary artery without angina pectoris: Secondary | ICD-10-CM

## 2020-12-05 DIAGNOSIS — Z6841 Body Mass Index (BMI) 40.0 and over, adult: Secondary | ICD-10-CM

## 2020-12-05 DIAGNOSIS — F3289 Other specified depressive episodes: Secondary | ICD-10-CM

## 2020-12-05 DIAGNOSIS — Z9189 Other specified personal risk factors, not elsewhere classified: Secondary | ICD-10-CM | POA: Diagnosis not present

## 2020-12-05 MED ORDER — WEGOVY 0.5 MG/0.5ML ~~LOC~~ SOAJ: 0.5000 mg | SUBCUTANEOUS | 0 refills | Status: DC

## 2020-12-05 MED ORDER — WEGOVY 0.25 MG/0.5ML ~~LOC~~ SOAJ: 0.2500 mg | SUBCUTANEOUS | 0 refills | Status: DC

## 2020-12-06 ENCOUNTER — Encounter (HOSPITAL_COMMUNITY)
Admission: RE | Admit: 2020-12-06 | Discharge: 2020-12-06 | Disposition: A | Discharge status: Home / Self Care | Payer: No Typology Code available for payment source | Source: Ambulatory Visit | Attending: Interventional Cardiology | Admitting: Interventional Cardiology

## 2020-12-06 DIAGNOSIS — Z955 Presence of coronary angioplasty implant and graft: Secondary | ICD-10-CM | POA: Diagnosis not present

## 2020-12-09 ENCOUNTER — Other Ambulatory Visit: Payer: Self-pay

## 2020-12-09 ENCOUNTER — Encounter (HOSPITAL_COMMUNITY)
Admission: RE | Admit: 2020-12-09 | Discharge: 2020-12-09 | Disposition: A | Discharge status: Home / Self Care | Payer: No Typology Code available for payment source | Source: Ambulatory Visit | Attending: Interventional Cardiology | Admitting: Interventional Cardiology

## 2020-12-09 DIAGNOSIS — Z955 Presence of coronary angioplasty implant and graft: Secondary | ICD-10-CM

## 2020-12-10 NOTE — Progress Notes (Signed)
Cardiac Individual Treatment Plan  Patient Details  Name: Hector [name] MRN: [id number] Date of Birth: 1971-02-16 Referring Provider:   Flowsheet Row CARDIAC REHAB PHASE II ORIENTATION from 10/17/2020 in [place]  Referring Provider Gerrit Halls, MD      Initial Encounter Date:  [place] PHASE II ORIENTATION from 10/17/2020 in [place]  Date 10/17/20      Visit Diagnosis: S/P drug eluting coronary stent placement 08/19/20  Patient's Home Medications on Admission:  Current Outpatient Medications:  .  aspirin EC 81 MG tablet, Take 1 tablet (81 mg total) by mouth daily. Swallow whole., Disp: 150 tablet, Rfl: 2 .  atorvastatin (LIPITOR) 80 MG tablet, Take 1 tablet (80 mg total) by mouth daily., Disp: 90 tablet, Rfl: 3 .  FLUoxetine (PROZAC) 10 MG capsule, TAKE 1 CAPSULE DAILY, Disp: 90 capsule, Rfl: 3 .  furosemide (LASIX) 40 MG tablet, Take 1 tablet (40 mg total) by mouth daily as needed for edema., Disp: 30 tablet, Rfl: 0 .  LORazepam (ATIVAN) 1 MG tablet, TAKE 1 TABLET (1 MG TOTAL) BY MOUTH EVERY 8 (EIGHT) HOURS AS NEEDED FOR ANXIETY., Disp: 30 tablet, Rfl: 0 .  losartan (COZAAR) 50 MG tablet, Take 1 tablet (50 mg total) by mouth daily., Disp: 90 tablet, Rfl: 3 .  mupirocin ointment (BACTROBAN) 2 %, Apply 1 application topically 2 (two) times daily., Disp: 30 g, Rfl: 2 .  nebivolol (BYSTOLIC) 10 MG tablet, TAKE 1 TABLET BY MOUTH ONCE DAILY, Disp: 90 tablet, Rfl: 3 .  Semaglutide-Weight Management (WEGOVY) 0.25 MG/0.5ML SOAJ, Inject 0.25 mg into the skin once a week., Disp: 1 mL, Rfl: 0 .  Semaglutide-Weight Management (WEGOVY) 0.5 MG/0.5ML SOAJ, Inject 0.5 mg into the skin once a week., Disp: 2 mL, Rfl: 0 .  ticagrelor (BRILINTA) 90 MG TABS tablet, Take 1 tablet (90 mg total) by mouth 2 (two) times daily., Disp: 180 tablet, Rfl: 3  Past Medical History: Past Medical History:  Diagnosis Date  .  Alcohol abuse   . Allergy   . Anal fissure   . Anxiety   . Asthma   . Back pain   . Bilateral swelling of feet   . Blood in stool   . Chest pain   . Coronary artery disease   . Depression   . Drug use   . Febrile seizure ([place])   . Heartburn   . Hyperlipidemia   . Hypertension   . Irritable bowel syndrome with diarrhea   . Jaundice, newborn   . Palpitations   . Panic   . Panic disorder   . Pre-diabetes   . Seizures ([facility])    febrile  . Shortness of breath   . Sleep apnea     Tobacco Use: Social History   Tobacco Use  Smoking Status Never Smoker  Smokeless Tobacco Never Used    Labs: Recent Review Flowsheet Data    Labs for ITP Cardiac and Pulmonary Rehab Latest Ref Rng & Units 03/13/2015 04/13/2018 06/12/2019 11/16/2019 12/02/2020   Cholestrol 100 - 199 mg/dL 195 186 103 136 135   LDLCALC 0 - 99 mg/dL 133(H) 114(H) 54 81 83   HDL >39 mg/dL 34.20(L) 33.70(L) 37.40(L) 35(L) 34(L)   Trlycerides 0 - 149 mg/dL 138.0 195.0(H) 61.0 105 97   Hemoglobin A1c 4.8 - 5.6 % - - - 5.2 -      Capillary Blood Glucose: No results found for: GLUCAP  Exercise Target Goals: Exercise Program Goal: Individual exercise prescription set using results from initial 6 min walk test and THRR while considering  patient's activity barriers and safety.   Exercise Prescription Goal: Initial exercise prescription builds to 30-45 minutes a day of aerobic activity, 2-3 days per week.  Home exercise guidelines will be given to patient during program as part of exercise prescription that the participant will acknowledge.  Activity Barriers & Risk Stratification:  Activity Barriers & Cardiac Risk Stratification - 10/17/20 1122      Activity Barriers & Cardiac Risk Stratification   Activity Barriers History of Falls;Deconditioning    Cardiac Risk Stratification High           6 Minute Walk:  6 Minute Walk    Row Name 10/17/20 1022 12/09/20 1329       6 Minute Walk   Phase Initial  Discharge    Distance 1200 feet 1764 feet    Distance % Change -- 47 %    Distance Feet Change -- 564 ft    Walk Time 6 minutes 6 minutes    # of Rest Breaks 0 0    MPH 2.27 3.34    METS 3.51 4.13    RPE 11 12    Perceived Dyspnea  0 0    VO2 Peak 12.3 14.46    Symptoms -- Yes (comment)    Comments -- Left foot pain 1/10    Resting HR 62 bpm 73 bpm    Resting BP 118/84 132/80    Resting Oxygen Saturation  97 % --    Exercise Oxygen Saturation  during 6 min walk 99 % --    Max Ex. HR 81 bpm 89 bpm    Max Ex. BP 134/82 156/90    2 Minute Post BP 110/64 --           Oxygen Initial Assessment:   Oxygen Re-Evaluation:   Oxygen Discharge (Final Oxygen Re-Evaluation):   Initial Exercise Prescription:  Initial Exercise Prescription - 10/17/20 1100      Date of Initial Exercise RX and Referring Provider   Date 10/17/20    Referring Provider Gerrit Halls, MD    Expected Discharge Date 12/13/20      NuStep   Level 2    SPM 85    Minutes 15    METs 2      Track   Laps 10    Minutes 15    METs 2.16      Prescription Details   Frequency (times per week) 3    Duration Progress to 30 minutes of continuous aerobic without signs/symptoms of physical distress      Intensity   THRR 40-80% of Max Heartrate 68-137    Ratings of Perceived Exertion 11-13    Perceived Dyspnea 0-4      Progression   Progression Continue progressive overload as per policy without signs/symptoms or physical distress.      Resistance Training   Training Prescription Yes    Weight 3 lbs    Reps 10-15           Perform Capillary Blood Glucose checks as needed.  Exercise Prescription Changes:  Exercise Prescription Changes    Row Name 10/21/20 1400 10/30/20 1445 11/11/20 1625 11/29/20 1512       Response to Exercise   Blood Pressure (Admit) 122/82 140/76 128/68 124/70    Blood Pressure (Exercise) 144/78 132/72 140/80 128/70    Blood Pressure (Exit) 112/70 120/80  110/54 106/72     Heart Rate (Admit) 67 bpm 71 bpm 75 bpm 62 bpm    Heart Rate (Exercise) 94 bpm 88 bpm 92 bpm 97 bpm    Heart Rate (Exit) 72 bpm 71 bpm 52 bpm 62 bpm    Rating of Perceived Exertion (Exercise) _0 Symptoms None None None None    Comments Pt's first day of exercise in the CRP2 program. Reviewed METs and home Exercise Rx Reviewed METs Reviewed Goals    Duration Progress to 30 minutes of  aerobic without signs/symptoms of physical distress Progress to 30 minutes of  aerobic without signs/symptoms of physical distress Continue with 30 min of aerobic exercise without signs/symptoms of physical distress. Continue with 30 min of aerobic exercise without signs/symptoms of physical distress.    Intensity THRR unchanged THRR unchanged THRR unchanged THRR unchanged         Progression   Progression Continue to progress workloads to maintain intensity without signs/symptoms of physical distress. Continue to progress workloads to maintain intensity without signs/symptoms of physical distress. Continue to progress workloads to maintain intensity without signs/symptoms of physical distress. Continue to progress workloads to maintain intensity without signs/symptoms of physical distress.    Average METs 2.3 2.6 2.9 3.2         Resistance Training   Training Prescription Yes No Yes Yes    Weight 3 lbs --  No weights on Wednesday 4 lbs 4 lbs    Reps 10-15 -- 10-15 10-15    Time 10 Minutes -- 10 Minutes 10 Minutes         Interval Training   Interval Training No No No No         NuStep   Level _1 SPM 80 90 100 100    Minutes _2 METs 1.8 2.3 3 3.2         Track   Laps _3 Minutes _4 METs 2.86 2.86 2.86 3.09         Home Exercise Plan   Plans to continue exercise at -- Home (comment) Home (comment) [facility]    Frequency -- Add 2 additional days to program exercise sessions. Add 2 additional days to program exercise sessions. Add 2  additional days to program exercise sessions.    Initial Home Exercises Provided -- 10/30/20 10/30/20 10/30/20           Exercise Comments:  Exercise Comments    Row Name 10/21/20 1452 10/30/20 1445 11/11/20 1511       Exercise Comments Pt's first day of exercise in the CRP2 program. Tolerated exercise session well with no complaints. Reviwed METs and home exercise Rx. Pt verbalized understanding of the Rx and was provided a copy. Reviewed Mets. Pt making good progress in the CRP2 program.            Exercise Goals and Review:  Exercise Goals    Row Name 10/17/20 1124             Exercise Goals   Increase Physical Activity Yes       Intervention Provide advice, education, support and counseling about physical activity/exercise needs.;Develop an individualized exercise prescription for aerobic and resistive training based on initial evaluation findings, risk stratification, comorbidities and participant's personal goals.       Expected Outcomes Short Term:  Attend rehab on a regular basis to increase amount of physical activity.;Long Term: Add in home exercise to make exercise part of routine and to increase amount of physical activity.;Long Term: Exercising regularly at least 3-5 days a week.       Increase Strength and Stamina Yes       Intervention Provide advice, education, support and counseling about physical activity/exercise needs.;Develop an individualized exercise prescription for aerobic and resistive training based on initial evaluation findings, risk stratification, comorbidities and participant's personal goals.       Expected Outcomes Short Term: Increase workloads from initial exercise prescription for resistance, speed, and METs.;Long Term: Improve cardiorespiratory fitness, muscular endurance and strength as measured by increased METs and functional capacity (6MWT);Short Term: Perform resistance training exercises routinely during rehab and add in resistance training  at home       Able to understand and use rate of perceived exertion (RPE) scale Yes       Intervention Provide education and explanation on how to use RPE scale       Expected Outcomes Short Term: Able to use RPE daily in rehab to express subjective intensity level;Long Term:  Able to use RPE to guide intensity level when exercising independently       Knowledge and understanding of Target Heart Rate Range (THRR) Yes       Intervention Provide education and explanation of THRR including how the numbers were predicted and where they are located for reference       Expected Outcomes Short Term: Able to state/look up THRR;Short Term: Able to use daily as guideline for intensity in rehab;Long Term: Able to use THRR to govern intensity when exercising independently       Able to check pulse independently Yes       Intervention Provide education and demonstration on how to check pulse in carotid and radial arteries.;Review the importance of being able to check your own pulse for safety during independent exercise       Expected Outcomes Short Term: Able to explain why pulse checking is important during independent exercise;Long Term: Able to check pulse independently and accurately       Understanding of Exercise Prescription Yes       Intervention Provide education, explanation, and written materials on patient's individual exercise prescription       Expected Outcomes Short Term: Able to explain program exercise prescription;Long Term: Able to explain home exercise prescription to exercise independently              Exercise Goals Re-Evaluation :  Exercise Goals Re-Evaluation    Row Name 10/21/20 1451 10/30/20 1445 11/29/20 1518 12/05/20 1515       Exercise Goal Re-Evaluation   Exercise Goals Review Increase Physical Activity;Increase Strength and Stamina;Able to understand and use rate of perceived exertion (RPE) scale;Knowledge and understanding of Target Heart Rate Range (THRR);Able to check  pulse independently;Understanding of Exercise Prescription Increase Physical Activity;Increase Strength and Stamina;Able to understand and use rate of perceived exertion (RPE) scale;Knowledge and understanding of Target Heart Rate Range (THRR);Able to check pulse independently;Understanding of Exercise Prescription Increase Physical Activity;Increase Strength and Stamina;Able to understand and use rate of perceived exertion (RPE) scale;Knowledge and understanding of Target Heart Rate Range (THRR);Able to check pulse independently;Understanding of Exercise Prescription --    Comments Pt's first day of exercise in the CRP2 program. Pt tolerated exercise session well with no complaints. Pt understands RPE scale, THRR and exercise Rx. Reviewed METs and home exercise  Rx. Pt is not currently walking at home but will do it with his sister. Pt will walk 2x/week for 30 minutes. Pt verbalized understnading of home exercise Rx and was provided a copy. Reviewed Goals. Pt has an average MET level of 3.2. Pt continue to make progress to improve cardiovascular fitness. Pt voices has not been walking at home to due to work issues. Continue to encourage 2 days of walking at home. --    Expected Outcomes Will continue to monitor patient nad progress as tolerated. Pt will walk at home 2x/week for 30 minutes. Will continue to progress worloads as tolerated and encouraged walking at home 2x/week. --           Discharge Exercise Prescription (Final Exercise Prescription Changes):  Exercise Prescription Changes - 11/29/20 1512      Response to Exercise   Blood Pressure (Admit) 124/70    Blood Pressure (Exercise) 128/70    Blood Pressure (Exit) 106/72    Heart Rate (Admit) 62 bpm    Heart Rate (Exercise) 97 bpm    Heart Rate (Exit) 62 bpm    Rating of Perceived Exertion (Exercise) 13    Symptoms None    Comments Reviewed Goals    Duration Continue with 30 min of aerobic exercise without signs/symptoms of physical  distress.    Intensity THRR unchanged      Progression   Progression Continue to progress workloads to maintain intensity without signs/symptoms of physical distress.    Average METs 3.2      Resistance Training   Training Prescription Yes    Weight 4 lbs    Reps 10-15    Time 10 Minutes      Interval Training   Interval Training No      NuStep   Level 5    SPM 100    Minutes 15    METs 3.2      Track   Laps 18    Minutes 15    METs 3.09      Home Exercise Plan   Plans to continue exercise at [facility] 2 additional days to program exercise sessions.    Initial Home Exercises Provided 10/30/20           Nutrition:  Target Goals: Understanding of nutrition guidelines, daily intake of sodium <1547m, cholesterol <2073m calories 30% from fat and 7% or less from saturated fats, daily to have 5 or more servings of fruits and vegetables.  Biometrics:  Pre Biometrics - 10/17/20 0915      Pre Biometrics   Waist Circumference 54.25 inches    Hip Circumference 51 inches    Waist to Hip Ratio 1.06 %    Triceps Skinfold 22 mm    % Body Fat 39.6 %    Grip Strength 42 kg    Flexibility 0 in   Pt could not reach box   Single Leg Stand 9.75 seconds            Nutrition Therapy Plan and Nutrition Goals:  Nutrition Therapy & Goals - 12/10/20 0905      Nutrition Therapy   Diet TLC    Drug/Food Interactions Statins/Certain Fruits      Personal Nutrition Goals   Nutrition Goal Pt to identify food quantities necessary to achieve weight loss of 6-24 lb at graduation from cardiac rehab.    Personal Goal #2 Pt to build a healthy plate including vegetables, fruits, whole grains, and low-fat  dairy products in a heart healthy meal plan.      Intervention Plan   Intervention Prescribe, educate and counsel regarding individualized specific dietary modifications aiming towards targeted core components such as weight, hypertension, lipid management, diabetes,  heart failure and other comorbidities.    Expected Outcomes Short Term Goal: Understand basic principles of dietary content, such as calories, fat, sodium, cholesterol and nutrients.           Nutrition Assessments:  Nutrition Assessments - 10/17/20 1443      MEDFICTS Scores   Pre Score 131          MEDIFICTS Score Key:  ?70 Need to make dietary changes   40-70 Heart Healthy Diet  ? 40 Therapeutic Level Cholesterol Diet    Picture Your Plate Scores:  <85 Unhealthy dietary pattern with much room for improvement.  41-50 Dietary pattern unlikely to meet recommendations for good health and room for improvement.  51-60 More healthful dietary pattern, with some room for improvement.   >60 Healthy dietary pattern, although there may be some specific behaviors that could be improved.    Nutrition Goals Re-Evaluation:  Nutrition Goals Re-Evaluation    [place] Name 12/10/20 0906             Goals   Current Weight 269 lb 10 oz (122.3 kg)       Nutrition Goal Pt to identify food quantities necessary to achieve weight loss of 6-24 lb at graduation from cardiac rehab.       Comment Weight loss 6 lbs since start of cardiac rehab.               Personal Goal #2 Re-Evaluation   Personal Goal #2 Pt to build a healthy plate including vegetables, fruits, whole grains, and low-fat dairy products in a heart healthy meal plan.              Nutrition Goals Re-Evaluation:  Nutrition Goals Re-Evaluation    [place] Name 12/10/20 0906             Goals   Current Weight 269 lb 10 oz (122.3 kg)       Nutrition Goal Pt to identify food quantities necessary to achieve weight loss of 6-24 lb at graduation from cardiac rehab.       Comment Weight loss 6 lbs since start of cardiac rehab.               Personal Goal #2 Re-Evaluation   Personal Goal #2 Pt to build a healthy plate including vegetables, fruits, whole grains, and low-fat dairy products in a heart healthy meal plan.               Nutrition Goals Discharge (Final Nutrition Goals Re-Evaluation):  Nutrition Goals Re-Evaluation - 12/10/20 0906      Goals   Current Weight 269 lb 10 oz (122.3 kg)    Nutrition Goal Pt to identify food quantities necessary to achieve weight loss of 6-24 lb at graduation from cardiac rehab.    Comment Weight loss 6 lbs since start of cardiac rehab.      Personal Goal #2 Re-Evaluation   Personal Goal #2 Pt to build a healthy plate including vegetables, fruits, whole grains, and low-fat dairy products in a heart healthy meal plan.           Psychosocial: Target Goals: Acknowledge presence or absence of significant depression and/or stress, maximize coping skills, provide positive support system. Participant is able to verbalize  types and ability to use techniques and skills needed for reducing stress and depression.  Initial Review & Psychosocial Screening:  Initial Psych Review & Screening - 10/17/20 1148      Initial Review   Current issues with History of Depression;Current Depression;Current Stress Concerns    Source of Stress Concerns Chronic Illness;Family;Occupation    [place] is currently going through a divorce. Hector [name] shares custody with his wife. Hector [name] is currently depressed he is taking an antidepressant which somewhat controlls his depression      Family Dynamics   Good Support System? Yes   Hector [name] lives alone. Hector [name] has a sister who lives near by who he has for support     Barriers   Psychosocial barriers to participate in program The patient should benefit from training in stress management and relaxation.      Screening Interventions   Interventions To provide support and resources with identified psychosocial needs;Encouraged to exercise;Provide feedback about the scores to participant   Will forward to his quality of life questionniare to his wellness or primary care physician   Expected Outcomes Short Term goal: Utilizing psychosocial counselor, staff and  physician to assist with identification of specific Stressors or current issues interfering with healing process. Setting desired goal for each stressor or current issue identified.;Long Term Goal: Stressors or current issues are controlled or eliminated.;Short Term goal: Identification and review with participant of any Quality of Life or Depression concerns found by scoring the questionnaire.;Long Term goal: The participant improves quality of Life and PHQ9 Scores as seen by post scores and/or verbalization of changes           Quality of Life Scores:  Quality of Life - 10/17/20 1112      Quality of Life   Select Quality of Life      Quality of Life Scores   Health/Function Pre 18.23 %    Socioeconomic Pre 18.86 %    Psych/Spiritual Pre 12.64 %    Family Pre 16.88 %    GLOBAL Pre 17.02 %          Scores of 19 and below usually indicate a poorer quality of life in these areas.  A difference of  2-3 points is a clinically meaningful difference.  A difference of 2-3 points in the total score of the Quality of Life Index has been associated with significant improvement in overall quality of life, self-image, physical symptoms, and general health in studies assessing change in quality of life.  PHQ-9: Recent Review Flowsheet Data    Depression screen [facility] 2/9 10/23/2020 10/17/2020 11/16/2019   Decreased Interest 0 1 1   Down, Depressed, Hopeless 0 1 1   PHQ - 2 Score 0 2 2   Altered sleeping - - 3   Tired, decreased energy - - 3   Change in appetite - - 2   Feeling bad or failure about yourself  - - 3   Trouble concentrating - - 1   Moving slowly or fidgety/restless - - 1   Suicidal thoughts - - 0   PHQ-9 Score - - 15   Difficult doing work/chores - - Somewhat difficult     Interpretation of Total Score  Total Score Depression Severity:  1-4 = Minimal depression, 5-9 = Mild depression, 10-14 = Moderate depression, 15-19 = Moderately severe depression, 20-27 = Severe depression    Psychosocial Evaluation and Intervention:   Psychosocial Re-Evaluation:  Psychosocial Re-Evaluation    Row Name 10/23/20 1610 11/12/20  1629 12/10/20 1221         Psychosocial Re-Evaluation   Current issues with Current Stress Concerns;Current Depression;History of Depression;Current Anxiety/Panic Current Stress Concerns;Current Depression;History of Depression;Current Anxiety/Panic Current Stress Concerns;Current Depression;History of Depression;Current Anxiety/Panic     Comments Reviewed quality of life on 10/23/20. Says depression is better since starting cardiac rehab Hector [name] continues to struggle with depression and anxiety but states it is getting much better since starting cardiac rehab. He is going through a divorce and shares custody with his wife. He is taking antidepressants which help control his symptoms. No interventions needed at this time as patient feels he depression symptoms are stable. Worked with EP at most recent cardiac rehab session on stress management skills and relaxation stratagies. Mr. Dave continues to struggle with depression and anxiety but states it is getting much better since starting cardiac rehab. He is going through a divorce and shares custody with his wife. He is taking antidepressants which help control his symptoms. No interventions needed at this time as patient feels he depression symptoms are stable. Has recieved education on stress management skills and relaxation stratagies while in the program. Will continue to reinforce prior to discharge.     Expected Outcomes Patient will have decreased depression and anxiety upon the completion of phase 2 cardiac rehab Patient will have decreased depression and anxiety upon the completion of phase 2 cardiac rehab Patient will have decreased depression and anxiety upon the completion of phase 2 cardiac rehab     Interventions -- Encouraged to attend Cardiac Rehabilitation for the exercise;Stress management  education;Relaxation education Encouraged to attend Cardiac Rehabilitation for the exercise;Stress management education;Relaxation education     Continue Psychosocial Services  Follow up required by staff Follow up required by staff Follow up required by staff     Comments Siddharth is currently going through a divorce. [place] shares custody with his wife. Kroy is currently depressed he is taking an antidepressant which somewhat controlls his depression -- --           Initial Review   Source of Stress Concerns Chronic Illness;Family -- --            Psychosocial Discharge (Final Psychosocial Re-Evaluation):  Psychosocial Re-Evaluation - 12/10/20 1221      Psychosocial Re-Evaluation   Current issues with Current Stress Concerns;Current Depression;History of Depression;Current Anxiety/Panic    Comments Hector [name] continues to struggle with depression and anxiety but states it is getting much better since starting cardiac rehab. He is going through a divorce and shares custody with his wife. He is taking antidepressants which help control his symptoms. No interventions needed at this time as patient feels he depression symptoms are stable. Has recieved education on stress management skills and relaxation stratagies while in the program. Will continue to reinforce prior to discharge.    Expected Outcomes Patient will have decreased depression and anxiety upon the completion of phase 2 cardiac rehab    Interventions Encouraged to attend Cardiac Rehabilitation for the exercise;Stress management education;Relaxation education    Continue Psychosocial Services  Follow up required by staff           Vocational Rehabilitation: Provide vocational rehab assistance to qualifying candidates.   Vocational Rehab Evaluation & Intervention:  Vocational Rehab - 10/17/20 4944      Initial Vocational Rehab Evaluation & Intervention   Assessment shows need for Vocational Rehabilitation No   Hector [name] is currently  working and does not need vocational rehab at this time  Education: Education Goals: Education classes will be provided on a weekly basis, covering required topics. Participant will state understanding/return demonstration of topics presented.  Learning Barriers/Preferences:  Learning Barriers/Preferences - 10/17/20 1116      Learning Barriers/Preferences   Learning Barriers Sight;Hearing   wears glasses   Learning Preferences Computer/Internet;Pictoral;Skilled Demonstration           Education Topics: Count Your Pulse:  -Group instruction provided by verbal instruction, demonstration, patient participation and written materials to support subject.  Instructors address importance of being able to find your pulse and how to count your pulse when at home without a heart monitor.  Patients get hands on experience counting their pulse with staff help and individually.   Heart Attack, Angina, and Risk Factor Modification:  -Group instruction provided by verbal instruction, video, and written materials to support subject.  Instructors address signs and symptoms of angina and heart attacks.    Also discuss risk factors for heart disease and how to make changes to improve heart health risk factors.   Functional Fitness:  -Group instruction provided by verbal instruction, demonstration, patient participation, and written materials to support subject.  Instructors address safety measures for doing things around the house.  Discuss how to get up and down off the floor, how to pick things up properly, how to safely get out of a chair without assistance, and balance training.   Meditation and Mindfulness:  -Group instruction provided by verbal instruction, patient participation, and written materials to support subject.  Instructor addresses importance of mindfulness and meditation practice to help reduce stress and improve awareness.  Instructor also leads participants through a  meditation exercise.    Stretching for Flexibility and Mobility:  -Group instruction provided by verbal instruction, patient participation, and written materials to support subject.  Instructors lead participants through series of stretches that are designed to increase flexibility thus improving mobility.  These stretches are additional exercise for major muscle groups that are typically performed during regular warm up and cool down.   Hands Only CPR:  -Group verbal, video, and participation provides a basic overview of AHA guidelines for community CPR. Role-play of emergencies allow participants the opportunity to practice calling for help and chest compression technique with discussion of AED use.   Hypertension: -Group verbal and written instruction that provides a basic overview of hypertension including the most recent diagnostic guidelines, risk factor reduction with self-care instructions and medication management.    Nutrition I class: Heart Healthy Eating:  -Group instruction provided by PowerPoint slides, verbal discussion, and written materials to support subject matter. The instructor gives an explanation and review of the Therapeutic Lifestyle Changes diet recommendations, which includes a discussion on lipid goals, dietary fat, sodium, fiber, plant stanol/sterol esters, sugar, and the components of a well-balanced, healthy diet.   Nutrition II class: Lifestyle Skills:  -Group instruction provided by PowerPoint slides, verbal discussion, and written materials to support subject matter. The instructor gives an explanation and review of label reading, grocery shopping for heart health, heart healthy recipe modifications, and ways to make healthier choices when eating out.   Diabetes Question & Answer:  -Group instruction provided by PowerPoint slides, verbal discussion, and written materials to support subject matter. The instructor gives an explanation and review of diabetes  co-morbidities, pre- and post-prandial blood glucose goals, pre-exercise blood glucose goals, signs, symptoms, and treatment of hypoglycemia and hyperglycemia, and foot care basics.   Diabetes Blitz:  -Group instruction provided by [employer], verbal discussion, and  written materials to support subject matter. The instructor gives an explanation and review of the physiology behind type 1 and type 2 diabetes, diabetes medications and rational behind using different medications, pre- and post-prandial blood glucose recommendations and Hemoglobin A1c goals, diabetes diet, and exercise including blood glucose guidelines for exercising safely.    Portion Distortion:  -Group instruction provided by PowerPoint slides, verbal discussion, written materials, and food models to support subject matter. The instructor gives an explanation of serving size versus portion size, changes in portions sizes over the last 20 years, and what consists of a serving from each food group.   Stress Management:  -Group instruction provided by verbal instruction, video, and written materials to support subject matter.  Instructors review role of stress in heart disease and how to cope with stress positively.     Exercising on Your Own:  -Group instruction provided by verbal instruction, power point, and written materials to support subject.  Instructors discuss benefits of exercise, components of exercise, frequency and intensity of exercise, and end points for exercise.  Also discuss use of nitroglycerin and activating EMS.  Review options of places to exercise outside of rehab.  Review guidelines for sex with heart disease.   Cardiac Drugs I:  -Group instruction provided by verbal instruction and written materials to support subject.  Instructor reviews cardiac drug classes: antiplatelets, anticoagulants, beta blockers, and statins.  Instructor discusses reasons, side effects, and lifestyle considerations for each  drug class.   Cardiac Drugs II:  -Group instruction provided by verbal instruction and written materials to support subject.  Instructor reviews cardiac drug classes: angiotensin converting enzyme inhibitors (ACE-I), angiotensin II receptor blockers (ARBs), nitrates, and calcium channel blockers.  Instructor discusses reasons, side effects, and lifestyle considerations for each drug class.   Anatomy and Physiology of the Circulatory System:  Group verbal and written instruction and models provide basic cardiac anatomy and physiology, with the coronary electrical and arterial systems. Review of: AMI, Angina, Valve disease, Heart Failure, Peripheral Artery Disease, Cardiac Arrhythmia, Pacemakers, and the ICD.   Other Education:  -Group or individual verbal, written, or video instructions that support the educational goals of the cardiac rehab program.   Holiday Eating Survival Tips:  -Group instruction provided by PowerPoint slides, verbal discussion, and written materials to support subject matter. The instructor gives patients tips, tricks, and techniques to help them not only survive but enjoy the holidays despite the onslaught of food that accompanies the holidays.   Knowledge Questionnaire Score:  Knowledge Questionnaire Score - 10/17/20 1115      Knowledge Questionnaire Score   Pre Score 21/24           Core Components/Risk Factors/Patient Goals at Admission:  Personal Goals and Risk Factors at Admission - 10/17/20 1118      Core Components/Risk Factors/Patient Goals on Admission    Weight Management Yes;Obesity;Weight Loss    Intervention Weight Management: Develop a combined nutrition and exercise program designed to reach desired caloric intake, while maintaining appropriate intake of nutrient and fiber, sodium and fats, and appropriate energy expenditure required for the weight goal.;Weight Management: Provide education and appropriate resources to help participant work on  and attain dietary goals.;Weight Management/Obesity: Establish reasonable short term and long term weight goals.;Obesity: Provide education and appropriate resources to help participant work on and attain dietary goals.    Admit Weight 272 lb 11.3 oz (123.7 kg)    Expected Outcomes Short Term: Continue to assess and modify interventions until short term  weight is achieved;Long Term: Adherence to nutrition and physical activity/exercise program aimed toward attainment of established weight goal;Weight Maintenance: Understanding of the daily nutrition guidelines, which includes 25-35% calories from fat, 7% or less cal from saturated fats, less than 218m cholesterol, less than 1.5gm of sodium, & 5 or more servings of fruits and vegetables daily;Weight Loss: Understanding of general recommendations for a balanced deficit meal plan, which promotes 1-2 lb weight loss per week and includes a negative energy balance of [phone] kcal/d;Understanding recommendations for meals to include 15-35% energy as protein, 25-35% energy from fat, 35-60% energy from carbohydrates, less than 2023mof dietary cholesterol, 20-35 gm of total fiber daily;Understanding of distribution of calorie intake throughout the day with the consumption of 4-5 meals/snacks    Hypertension Yes    Intervention Provide education on lifestyle modifcations including regular physical activity/exercise, weight management, moderate sodium restriction and increased consumption of fresh fruit, vegetables, and low fat dairy, alcohol moderation, and smoking cessation.;Monitor prescription use compliance.    Expected Outcomes Short Term: Continued assessment and intervention until BP is < 140/9023mG in hypertensive participants. < 130/48m22m in hypertensive participants with diabetes, heart failure or chronic kidney disease.;Long Term: Maintenance of blood pressure at goal levels.    Lipids Yes    Intervention Provide education and support for participant on  nutrition & aerobic/resistive exercise along with prescribed medications to achieve LDL <70mg56mL >40mg.82mExpected Outcomes Short Term: Participant states understanding of desired cholesterol values and is compliant with medications prescribed. Participant is following exercise prescription and nutrition guidelines.;Long Term: Cholesterol controlled with medications as prescribed, with individualized exercise RX and with personalized nutrition plan. Value goals: LDL < 70mg, 95m> 40 mg.    Stress Yes    Intervention Offer individual and/or small group education and counseling on adjustment to heart disease, stress management and health-related lifestyle change. Teach and support self-help strategies.;Refer participants experiencing significant psychosocial distress to appropriate mental health specialists for further evaluation and treatment. When possible, include family members and significant others in education/counseling sessions.    Expected Outcomes Short Term: Participant demonstrates changes in health-related behavior, relaxation and other stress management skills, ability to obtain effective social support, and compliance with psychotropic medications if prescribed.;Long Term: Emotional wellbeing is indicated by absence of clinically significant psychosocial distress or social isolation.           Core Components/Risk Factors/Patient Goals Review:   Goals and Risk Factor Review    Row Name 10/23/20 1623 11/12/20 1633 12/10/20 1222         Core Components/Risk Factors/Patient Goals Review   Personal Goals Review Weight Management/Obesity;Hypertension;Lipids;Stress Weight Management/Obesity;Hypertension;Lipids;Stress Weight Management/Obesity;Hypertension;Lipids;Stress     Review Lyn[place] stGariond exercise on 10/21/26. Off to a good start to exercise Mauro haAasirltiple CAD risk factors. His stress is related to currently going through a divorce and sharing custody of his child. He is actively  taking medications for depression and has participated in stress reduction/relaxation education in [facility] and from outside sources. He continues to slowly loose weight and working with [facility]. His BP is well controlled. He has modified his diet to include lipid lowering stratagies and foods. No current labs available for viewing lipid panel as patient's PCP is not in the [facility] system. Care everywhere did not provide any labs. Mr. Yankee iFlaxtarget to increase his stamina and strength, feel better, have more energy, and make healty habits by discharge 12/13/20.  He should also continue to slowly work towards his long term goal of weight loss with a weight reduction to 240 lbs. Hector [name] has multiple CAD risk factors. His stress is related to currently going through a divorce and sharing custody of his child as well as work stress. He is actively taking medications for depression and has participated in stress reduction/relaxation education in [facility] and from outside sources. He continues to gain weight and working with [facility]. His BP is well controlled. He has modified his diet to include lipid lowering stratagies and foods. No current labs available for viewing lipid panel as patient's PCP is not in the [facility] system. Care everywhere did not provide any labs. Hector [name] has met his goals of feelingbbetter, have more energy, and make healty habits by discharge 12/13/20. He should also continue to slowly work towards his long term goal of weight loss with a weight reduction to 240 lbs however this may not be accomplished until he is able to manage his work stress and anxiety in a more healthy way. Per EP patient smokes marajaunna for relaxation however side effect is the craving for food. Will continue to reinforce healthy ways to manage stress.     Expected Outcomes Hector [name] will continue to partcipate in phase 2 cardiac rehab for exercise, nutrtion  and lifestyle modifications lbs will continue to partcipate in phase 2 cardiac rehab for exercise, nutrtion and lifestyle modifications Patient will continue to partcipate in phase 2 cardiac rehab for exercise, nutrtion and lifestyle modifications            Core Components/Risk Factors/Patient Goals at Discharge (Final Review):   Goals and Risk Factor Review - 12/10/20 1222      Core Components/Risk Factors/Patient Goals Review   Personal Goals Review Weight Management/Obesity;Hypertension;Lipids;Stress    Review Vartan has multiple CAD risk factors. His stress is related to currently going through a divorce and sharing custody of his child as well as work stress. He is actively taking medications for depression and has participated in stress reduction/relaxation education in [facility] and from outside sources. He continues to gain weight and working with [facility]. His BP is well controlled. He has modified his diet to include lipid lowering stratagies and foods. No current labs available for viewing lipid panel as patient's PCP is not in the [facility] system. Care everywhere did not provide any labs. Mr. Sterbenz has met his goals of feelingbbetter, have more energy, and make healty habits by discharge 12/13/20. He should also continue to slowly work towards his long term goal of weight loss with a weight reduction to 240 lbs however this may not be accomplished until he is able to manage his work stress and anxiety in a more healthy way. Per EP patient smokes marajaunna for relaxation however side effect is the craving for food. Will continue to reinforce healthy ways to manage stress.    Expected Outcomes Patient will continue to partcipate in phase 2 cardiac rehab for exercise, nutrtion and lifestyle modifications           ITP Comments:  ITP Comments    Row Name 10/15/20 1138 10/17/20 1143 10/23/20 1609 11/12/20 1625 12/10/20 1213   ITP Comments Dr Fransico Him MD,  Medical Director Dr Fransico Him MD, Medical Director 30 Day ITP Review. Hector [name] started exercise on 10/21/20 off to a good start to exercise Hector [name] has completed 10 cardiac rehab exercise sessions as  of 11/12/20. He consistantly works to an RPE of 11-13 and tolerates work load increases. His goals are to increase his stamina and strength and to feel better. He also hopes to gain more energy and make healthy habits. He is currently working with [name] for wt loss. Current wt 270. Admission wt 272.6. Trueman has completed 19 of 24 sessions since enrolled in [facility]. He is scheduled to graduate Friday 12/13/20. He continues to work to an RPE of 11-13 and puts forth maximum effort in his exercise. He has increased his met level from  3.51 to 4.13 and was able to walk an additional 564 feet on his post-walktest performed Monday. He has met his goal of increasing his stamina and strength however he has gained weight from 123.4 kg to 126.9 kg. He admits to stress and anxiety related to work and his coping mechanism is eating. He continues to work with [facility]. He has recieved stress management education while in [facility] and states he attempts to use techniques when he is anxious or stressed.          Comments: see ITP and RF/Goals comments

## 2020-12-10 NOTE — Progress Notes (Signed)
Chief Complaint:   OBESITY Hector [name] is here to discuss his progress with his obesity treatment plan along with follow-up of his obesity related diagnoses.   Today's visit was #: 18 Starting weight: 282 lbs Starting date: 01/30/2020 Today's weight: 275 lbs Today's date: 12/05/2020 Total lbs lost to date: 7 lbs Body mass index is 40.61 kg/m.  Total weight loss percentage to date: -2.48%  Interim History: Hector [name] has been off [facility] for 4 weeks because it has not been available.  He is still in CV Rehab and will be finishing soon.  He says it has been doing more emotional eating because his cat died and he had a bad trip to [place] with his daughter and his ex-wife. Nutrition Plan: the Category 2 Plan for 20% of the time.  Hunger is poorly controlled. Cravings are poorly controlled.  Activity: Cardiac Regab for 60 minutes 3 times per week.  Assessment/Plan:   1. Visceral obesity Current visceral fat rating: 22. Visceral fat rating should be < 13. Visceral adipose tissue is a hormonally active component of total body fat. This body composition phenotype is associated with medical disorders such as metabolic syndrome, cardiovascular disease and several malignancies including prostate, breast, and colorectal cancers. Starting goal: Lose 7-10% of starting weight.   2. Essential hypertension Elevated today. Medications: Cozaar 50 mg daily and Bystolic 10 mg daily.   Plan: Avoid buying foods that are: processed, frozen, or prepackaged to avoid excess salt. We will continue to monitor symptoms as they relate to his weight loss journey.  BP Readings from Last 3 Encounters:  12/05/20 (!) 147/81  12/02/20 138/86  11/14/20 132/81   Lab Results  Component Value Date   CREATININE 1.16 12/02/2020   3. Other hyperlipidemia Course: Stable. Lipid-lowering medications: Lipitor 80 mg daily.   Plan: Dietary changes: Increase soluble fiber. Decrease simple carbohydrates. Exercise changes: An average  40 minutes of moderate to vigorous-intensity aerobic activity 3 or 4 times per week.   Lab Results  Component Value Date   CHOL 135 12/02/2020   HDL 34 (L) 12/02/2020   LDLCALC 83 12/02/2020   TRIG 97 12/02/2020   CHOLHDL 4.0 12/02/2020   Lab Results  Component Value Date   ALT 18 12/02/2020   AST 16 12/02/2020   ALKPHOS 71 12/02/2020   BILITOT 0.4 12/02/2020   The 10-year ASCVD risk score [name], et al., 2013) is: 4%   Values used to calculate the score:     Age: 3 years     Sex: Male     Is Non-Hispanic African American: No     Diabetic: No     Tobacco smoker: No     Systolic Blood Pressure: 627 mmHg     Is BP treated: Yes     HDL Cholesterol: 34 mg/dL     Total Cholesterol: 135 mg/dL  4. Coronary artery disease, unspecified vessel or lesion type, unspecified whether angina present, unspecified whether native or transplanted heart With metabolic, visceral obesity.  Will restart Wegovy, but if not covered, will try Trulicity 0.35 mg weekly.   5. Other depression, with emotional eating Tovia is taking Prozac 10 mg daily. Motivational interviewing as well as evidence-based interventions for health behavior change were utilized today including the discussion of self monitoring techniques, problem-solving barriers and SMART goal setting techniques.   6. At risk for heart disease Hector [name] was given approximately 10 minutes of coronary artery disease prevention counseling today. He is 49 y.o. male  and has risk factors for heart disease including obesity and metabolic syndrome. We again discussed intensive lifestyle modifications today with an emphasis on specific weight loss instructions and strategies. Repetitive spaced learning was employed today to elicit superior memory formation and behavioral change.  6. Class 3 severe obesity with serious comorbidity and body mass index (BMI) of 40.0 to 44.9 in adult, unspecified obesity type ([place])  - Restart Semaglutide-Weight Management  (WEGOVY) 0.25 MG/0.5ML SOAJ; Inject 0.25 mg into the skin once a week.  Dispense: 1 mL; Refill: 0 - Semaglutide-Weight Management (WEGOVY) 0.5 MG/0.5ML SOAJ; Inject 0.5 mg into the skin once a week.  Dispense: 2 mL; Refill: 0  Course: [name] is currently in the action stage of change. As such, his goal is to continue with weight loss efforts.   Nutrition goals: He has agreed to the Category 2 Plan.   Exercise goals: As is.  Behavioral modification strategies: increasing lean protein intake, decreasing simple carbohydrates, increasing vegetables, increasing water intake, decreasing alcohol intake, decreasing sodium intake and increasing high fiber foods.  Koren has agreed to follow-up with our clinic in 4 weeks. He was informed of the importance of frequent follow-up visits to maximize his success with intensive lifestyle modifications for his multiple health conditions.   Objective:   Blood pressure (!) 147/81, pulse (!) 47, temperature 97.9 F (36.6 C), temperature source Oral, height 5' 9"  (1.753 m), weight 275 lb (124.7 kg), SpO2 97 %. Body mass index is 40.61 kg/m.  General: Cooperative, alert, well developed, in no acute distress. HEENT: Conjunctivae and lids unremarkable. Cardiovascular: Regular rhythm.  Lungs: Normal work of breathing. Neurologic: No focal deficits.   Lab Results  Component Value Date   CREATININE 1.16 12/02/2020   BUN 25 (H) 12/02/2020   NA 141 12/02/2020   K 4.2 12/02/2020   CL 107 (H) 12/02/2020   CO2 21 12/02/2020   Lab Results  Component Value Date   ALT 18 12/02/2020   AST 16 12/02/2020   ALKPHOS 71 12/02/2020   BILITOT 0.4 12/02/2020   Lab Results  Component Value Date   HGBA1C 5.2 11/16/2019   Lab Results  Component Value Date   INSULIN 35.9 (H) 11/16/2019   Lab Results  Component Value Date   TSH 2.410 11/16/2019   Lab Results  Component Value Date   CHOL 135 12/02/2020   HDL 34 (L) 12/02/2020   LDLCALC 83 12/02/2020   TRIG 97  12/02/2020   CHOLHDL 4.0 12/02/2020   Lab Results  Component Value Date   WBC 7.0 12/02/2020   HGB 15.0 12/02/2020   HCT 44.4 12/02/2020   MCV 89 12/02/2020   PLT 166 12/02/2020   Attestation Statements:   Reviewed by clinician on day of visit: allergies, medications, problem list, medical history, surgical history, family history, social history, and previous encounter notes.  I, Water quality scientist, CMA, am acting as transcriptionist for Briscoe Deutscher, DO  I have reviewed the above documentation for accuracy and completeness, and I agree with the above. Briscoe Deutscher, DO

## 2020-12-11 ENCOUNTER — Other Ambulatory Visit: Payer: Self-pay

## 2020-12-11 ENCOUNTER — Encounter (HOSPITAL_COMMUNITY)
Admission: RE | Admit: 2020-12-11 | Discharge: 2020-12-11 | Disposition: A | Discharge status: Home / Self Care | Payer: No Typology Code available for payment source | Source: Ambulatory Visit | Attending: Interventional Cardiology | Admitting: Interventional Cardiology

## 2020-12-11 VITALS — Ht 69.5 in | Wt 279.8 lb

## 2020-12-11 DIAGNOSIS — Z955 Presence of coronary angioplasty implant and graft: Secondary | ICD-10-CM

## 2020-12-13 ENCOUNTER — Other Ambulatory Visit: Payer: Self-pay

## 2020-12-13 ENCOUNTER — Encounter (HOSPITAL_COMMUNITY)
Admission: RE | Admit: 2020-12-13 | Discharge: 2020-12-13 | Disposition: A | Discharge status: Home / Self Care | Payer: No Typology Code available for payment source | Source: Ambulatory Visit | Attending: Interventional Cardiology | Admitting: Interventional Cardiology

## 2020-12-13 DIAGNOSIS — Z955 Presence of coronary angioplasty implant and graft: Secondary | ICD-10-CM | POA: Diagnosis not present

## 2020-12-14 ENCOUNTER — Other Ambulatory Visit: Payer: Self-pay | Admitting: Family Medicine

## 2020-12-16 NOTE — Telephone Encounter (Signed)
Looks like this was filled per cardiology in August for one year.

## 2020-12-17 ENCOUNTER — Encounter: Payer: Self-pay | Admitting: Family Medicine

## 2020-12-17 ENCOUNTER — Ambulatory Visit (INDEPENDENT_AMBULATORY_CARE_PROVIDER_SITE_OTHER): Payer: No Typology Code available for payment source | Admitting: Psychology

## 2020-12-17 DIAGNOSIS — F4322 Adjustment disorder with anxiety: Secondary | ICD-10-CM

## 2020-12-17 MED ORDER — NEBIVOLOL HCL 10 MG PO TABS
ORAL_TABLET | ORAL | 1 refills | Status: DC
Start: 2020-12-17 — End: 2021-05-12

## 2020-12-23 ENCOUNTER — Encounter: Payer: Self-pay | Admitting: Family Medicine

## 2020-12-24 LAB — COLOGUARD: Cologuard: NEGATIVE

## 2020-12-30 ENCOUNTER — Ambulatory Visit (INDEPENDENT_AMBULATORY_CARE_PROVIDER_SITE_OTHER): Payer: No Typology Code available for payment source | Admitting: Psychology

## 2020-12-30 DIAGNOSIS — F4322 Adjustment disorder with anxiety: Secondary | ICD-10-CM | POA: Diagnosis not present

## 2021-01-01 MED FILL — WEGOVY 0.25 MG/0.5ML SOAJ: 0.25 | 28 days supply | Qty: 2 | Fill #0

## 2021-01-06 ENCOUNTER — Other Ambulatory Visit: Payer: No Typology Code available for payment source

## 2021-01-06 DIAGNOSIS — Z20822 Contact with and (suspected) exposure to covid-19: Secondary | ICD-10-CM

## 2021-01-07 ENCOUNTER — Telehealth (INDEPENDENT_AMBULATORY_CARE_PROVIDER_SITE_OTHER): Payer: No Typology Code available for payment source | Admitting: Family Medicine

## 2021-01-07 ENCOUNTER — Other Ambulatory Visit: Payer: Self-pay

## 2021-01-07 ENCOUNTER — Encounter (INDEPENDENT_AMBULATORY_CARE_PROVIDER_SITE_OTHER): Payer: Self-pay | Admitting: Family Medicine

## 2021-01-07 DIAGNOSIS — I10 Essential (primary) hypertension: Secondary | ICD-10-CM | POA: Diagnosis not present

## 2021-01-07 DIAGNOSIS — F3289 Other specified depressive episodes: Secondary | ICD-10-CM | POA: Diagnosis not present

## 2021-01-07 DIAGNOSIS — E7849 Other hyperlipidemia: Secondary | ICD-10-CM

## 2021-01-07 DIAGNOSIS — Z6841 Body Mass Index (BMI) 40.0 and over, adult: Secondary | ICD-10-CM

## 2021-01-08 ENCOUNTER — Other Ambulatory Visit (INDEPENDENT_AMBULATORY_CARE_PROVIDER_SITE_OTHER): Payer: Self-pay | Admitting: Family Medicine

## 2021-01-08 MED ORDER — WEGOVY 0.25 MG/0.5ML ~~LOC~~ SOAJ: 0.2500 mg | SUBCUTANEOUS | 0 refills | Status: DC

## 2021-01-09 LAB — NOVEL CORONAVIRUS, NAA: SARS-CoV-2, NAA: NOT DETECTED

## 2021-01-09 LAB — SARS-COV-2, NAA 2 DAY TAT

## 2021-01-09 NOTE — Progress Notes (Signed)
TeleHealth Visit:  Due to the COVID-19 pandemic, this visit was completed with telemedicine (audio/video) technology to reduce patient and provider exposure as well as to preserve personal protective equipment.   Hector [name] has verbally consented to this TeleHealth visit. The patient is located at home, the provider is located at the [facility] office. The participants in this visit include the listed provider and patient. The visit was conducted today via MyChart video.  Chief Complaint: OBESITY Hector [name] is here to discuss his progress with his obesity treatment plan along with follow-up of his obesity related diagnoses. Hector [name] is on the Category 2 Plan and states he is following his eating plan approximately 25% of the time. Hector [name] states he is not exercising regularly at this time.  Today's visit was #: 19 Starting weight: 282 lbs Starting date: 01/30/2020  Interim History: Hector [name] is doing a video visit today because he was exposed to COVID-19.  Assessment/Plan:   1. Essential hypertension Medications: losartan and nebivolol.   Plan: Avoid buying foods that are: processed, frozen, or prepackaged to avoid excess salt. We will continue to monitor symptoms as they relate to his weight loss journey.  BP Readings from Last 3 Encounters:  12/05/20 (!) 147/81  12/02/20 138/86  11/14/20 132/81   Lab Results  Component Value Date   CREATININE 1.16 12/02/2020   2. Other hyperlipidemia Lipid-lowering medications: Lipitor 80 mg daily.   Plan: Dietary changes: Increase soluble fiber. Decrease simple carbohydrates. Exercise changes: An average 40 minutes of moderate to vigorous-intensity aerobic activity 3 or 4 times per week.   Lab Results  Component Value Date   CHOL 135 12/02/2020   HDL 34 (L) 12/02/2020   LDLCALC 83 12/02/2020   TRIG 97 12/02/2020   CHOLHDL 4.0 12/02/2020   Lab Results  Component Value Date   ALT 18 12/02/2020   AST 16 12/02/2020   ALKPHOS 71 12/02/2020    BILITOT 0.4 12/02/2020   The 10-year ASCVD risk score Hector Bussing DC Jr., et al., 2013) is: 4%   Values used to calculate the score:     Age: 50 years     Sex: Male     Is Non-Hispanic African American: No     Diabetic: No     Tobacco smoker: No     Systolic Blood Pressure: 027 mmHg     Is BP treated: Yes     HDL Cholesterol: 34 mg/dL     Total Cholesterol: 135 mg/dL  3. Other depression, emotional eating Hector [name] is taking Prozac 10 mg daily.  Discussed cues and consequences, how thoughts affect eating, model of thoughts, feelings, and behaviors, and strategies for change by focusing on the cue. Discussed cognitive distortions, coping thoughts, and how to change your thoughts. Specifically regarding patient's less desirable eating habits and patterns, we employed the technique of small changes when @NAME @ has not been able to fully commit to @HIS @ prudent nutritional plan.  4. Class 3 severe obesity with serious comorbidity and body mass index (BMI) of 40.0 to 44.9 in adult, unspecified obesity type (Hector [name])  -Start Semaglutide-Weight Management (WEGOVY) 0.25 MG/0.5ML SOAJ; Inject 0.25 mg into the skin once a week.  Dispense: 1 mL; Refill: 0  Hector [name] is currently in the action stage of change. As such, his goal is to continue with weight loss efforts. He has agreed to the Category 2 Plan.   Exercise goals: For substantial health benefits, adults should do at least 150 minutes (2 hours and 30 minutes)  a week of moderate-intensity, or 75 minutes (1 hour and 15 minutes) a week of vigorous-intensity aerobic physical activity, or an equivalent combination of moderate- and vigorous-intensity aerobic activity. Aerobic activity should be performed in episodes of at least 10 minutes, and preferably, it should be spread throughout the week.  Behavioral modification strategies: increasing lean protein intake, decreasing simple carbohydrates, increasing vegetables and increasing water intake.  Hector [name] has agreed to  follow-up with our clinic in 3 weeks. He was informed of the importance of frequent follow-up visits to maximize his success with intensive lifestyle modifications for his multiple health conditions.  Objective:   VITALS: Per patient if applicable, see vitals. GENERAL: Alert and in no acute distress. CARDIOPULMONARY: No increased WOB. Speaking in clear sentences.  PSYCH: Pleasant and cooperative. Speech normal rate and rhythm. Affect is appropriate. Insight and judgement are appropriate. Attention is focused, linear, and appropriate.  NEURO: Oriented as arrived to appointment on time with no prompting.   Lab Results  Component Value Date   CREATININE 1.16 12/02/2020   BUN 25 (H) 12/02/2020   NA 141 12/02/2020   K 4.2 12/02/2020   CL 107 (H) 12/02/2020   CO2 21 12/02/2020   Lab Results  Component Value Date   ALT 18 12/02/2020   AST 16 12/02/2020   ALKPHOS 71 12/02/2020   BILITOT 0.4 12/02/2020   Lab Results  Component Value Date   HGBA1C 5.2 11/16/2019   Lab Results  Component Value Date   INSULIN 35.9 (H) 11/16/2019   Lab Results  Component Value Date   TSH 2.410 11/16/2019   Lab Results  Component Value Date   CHOL 135 12/02/2020   HDL 34 (L) 12/02/2020   LDLCALC 83 12/02/2020   TRIG 97 12/02/2020   CHOLHDL 4.0 12/02/2020   Lab Results  Component Value Date   WBC 7.0 12/02/2020   HGB 15.0 12/02/2020   HCT 44.4 12/02/2020   MCV 89 12/02/2020   PLT 166 12/02/2020   Attestation Statements:   Reviewed by clinician on day of visit: allergies, medications, problem list, medical history, surgical history, family history, social history, and previous encounter notes.  I, Water quality scientist, CMA, am acting as transcriptionist for Briscoe Deutscher, DO  I have reviewed the above documentation for accuracy and completeness, and I agree with the above. Briscoe Deutscher, DO

## 2021-01-10 ENCOUNTER — Encounter (INDEPENDENT_AMBULATORY_CARE_PROVIDER_SITE_OTHER): Payer: Self-pay

## 2021-01-13 ENCOUNTER — Ambulatory Visit (INDEPENDENT_AMBULATORY_CARE_PROVIDER_SITE_OTHER): Payer: No Typology Code available for payment source | Admitting: Psychology

## 2021-01-13 DIAGNOSIS — F4322 Adjustment disorder with anxiety: Secondary | ICD-10-CM

## 2021-01-14 ENCOUNTER — Encounter (HOSPITAL_COMMUNITY): Payer: Self-pay

## 2021-01-14 DIAGNOSIS — Z955 Presence of coronary angioplasty implant and graft: Secondary | ICD-10-CM

## 2021-01-14 NOTE — Progress Notes (Signed)
Discharge Progress Report  Patient Details  Name: RAMELO OETKEN MRN: [id number] Date of Birth: 12-Aug-1971 Referring Provider:   Flowsheet Row CARDIAC REHAB PHASE II ORIENTATION from 10/17/2020 in [facility]  Referring Provider Gerrit Halls, MD       Number of Visits: 20  Reason for Discharge:  Patient reached a stable level of exercise. Patient independent in their exercise. Patient has met program and personal goals.  Smoking History:  Social History   Tobacco Use  Smoking Status Never Smoker  Smokeless Tobacco Never Used    Diagnosis:  S/P drug eluting coronary stent placement 08/19/20  ADL [facility]:   Initial Exercise Prescription:   Discharge Exercise Prescription (Final Exercise Prescription Changes):  Exercise Prescription Changes - 12/13/20 1445      Response to Exercise   Blood Pressure (Admit) 130/70    Blood Pressure (Exercise) 146/82    Blood Pressure (Exit) 118/74    Heart Rate (Admit) 55 bpm    Heart Rate (Exercise) 92 bpm    Heart Rate (Exit) 54 bpm    Rating of Perceived Exertion (Exercise) 12    Symptoms None    Comments Pt graduated from the CRP2 program    Duration Continue with 30 min of aerobic exercise without signs/symptoms of physical distress.    Intensity THRR unchanged      Progression   Progression Continue to progress workloads to maintain intensity without signs/symptoms of physical distress.    Average METs 3.3      Resistance Training   Training Prescription Yes    Weight 5 lbs    Reps 10-15    Time 10 Minutes      Interval Training   Interval Training No      NuStep   Level 5    SPM 100    Minutes 15    METs 3.3      Track   Laps 20    Minutes 15    METs 3.32      Home Exercise Plan   Plans to continue exercise at Home (comment)    Frequency Add 2 additional days to program exercise sessions.    Initial Home Exercises Provided 10/30/20           Functional Capacity:  6  Minute Walk    Row Name 12/09/20 1329         6 Minute Walk   Phase Discharge     Distance 1764 feet     Distance % Change 47 %     Distance Feet Change 564 ft     Walk Time 6 minutes     # of Rest Breaks 0     MPH 3.34     METS 4.13     RPE 12     Perceived Dyspnea  0     VO2 Peak 14.46     Symptoms Yes (comment)     Comments Left foot pain 1/10     Resting HR 73 bpm     Resting BP 132/80     Max Ex. HR 89 bpm     Max Ex. BP 156/90            Psychological, QOL, Others - Outcomes: PHQ 2/9: Depression screen [facility] 2/9 10/23/2020 10/17/2020 11/16/2019  Decreased Interest 0 1 1  Down, Depressed, Hopeless 0 1 1  PHQ - 2 Score 0 2 2  Altered sleeping - - 3  Tired, decreased energy - -  3  Change in appetite - - 2  Feeling bad or failure about yourself  - - 3  Trouble concentrating - - 1  Moving slowly or fidgety/restless - - 1  Suicidal thoughts - - 0  PHQ-9 Score - - 15  Difficult doing work/chores - - Somewhat difficult    Quality of Life:  Quality of Life - 12/11/20 1439      Quality of Life Scores   Health/Function Post 20.4 %    Socioeconomic Post 23.07 %    Psych/Spiritual Post 18.5 %    Family Post 17 %    GLOBAL Post 20.06 %           Personal Goals: Goals established at orientation with interventions provided to work toward goal.    Personal Goals Discharge:  Goals and Risk Factor Review    Row Name 12/10/20 1222 01/14/21 0913           Core Components/Risk Factors/Patient Goals Review   Personal Goals Review Weight Management/Obesity;Hypertension;Lipids;Stress --      Review Md has multiple CAD risk factors. His stress is related to currently going through a divorce and sharing custody of his child as well as work stress. He is actively taking medications for depression and has participated in stress reduction/relaxation education in [facility] and from outside sources. He continues to gain weight and working with [facility] and [facility]. His BP is well controlled. He has modified his diet to include lipid lowering stratagies and foods. No current labs available for viewing lipid panel as patient's PCP is not in the [facility] system. Care everywhere did not provide any labs. Mr. Vallecillo has met his goals of feelingbbetter, have more energy, and make healty habits by discharge 12/13/20. He should also continue to slowly work towards his long term goal of weight loss with a weight reduction to 240 lbs however this may not be accomplished until he is able to manage his work stress and anxiety in a more healthy way. Per EP patient smokes marajaunna for relaxation however side effect is the craving for food. Will continue to reinforce healthy ways to manage stress. Micheal has multiple CAD risk factors. His stress is related to currently going through a divorce and sharing custody of his child as well as work stress. He is actively taking medications for depression and has participated in stress reduction/relaxation education in [facility] and from outside sources. He admits his stress evel has lessened since he has been in the program. He continues to gain weight and will continue to work with [facility]. His BP is well controlled. He has modified his diet to include lipid lowering stratagies and foods. No current labs available for viewing lipid panel as patient's PCP is not in the [facility] system. Care everywhere did not provide any labs. Mr. Becherer has met his goals of feeling better, have more energy, and making healty habits. He should also continue to slowly work towards his long term goal of weight loss with a weight reduction to 240 lbs however this may not be accomplished until he is able to manage his work stress and anxiety in a more healthy way. Per EP patient smokes marajaunna for relaxation however side effect is the craving for food.      Expected Outcomes Patient will continue to partcipate in phase 2  cardiac rehab for exercise, nutrtion and lifestyle modifications Patient will continue to utilize risk factor modifications and exercise  to reduce his CAD post discharge             Exercise Goals and Review:   Exercise Goals Re-Evaluation:  Exercise Goals Re-Evaluation    Row Name 11/29/20 1518 12/05/20 1515 12/13/20 1430         Exercise Goal Re-Evaluation   Exercise Goals Review Increase Physical Activity;Increase Strength and Stamina;Able to understand and use rate of perceived exertion (RPE) scale;Knowledge and understanding of Target Heart Rate Range (THRR);Able to check pulse independently;Understanding of Exercise Prescription -- Increase Physical Activity;Increase Strength and Stamina;Able to understand and use rate of perceived exertion (RPE) scale;Knowledge and understanding of Target Heart Rate Range (THRR);Able to check pulse independently;Understanding of Exercise Prescription     Comments Reviewed Goals. Pt has an average MET level of 3.2. Pt continue to make progress to improve cardiovascular fitness. Pt voices has not been walking at home to due to work issues. Continue to encourage 2 days of walking at home. -- Pt graduated for the [facility] program today. Pt made good progress and achiveved an average of 3.3 METs with exercise. The pt palns to contiue his exercise by walking at home 5 x/week, 30-45 minutes. Pt is considering joining the Phase 3 maintenance program.     Expected Outcomes Will continue to progress worloads as tolerated and encouraged walking at home 2x/week. -- Pt will continue to exercise at home by walking and tentatively joining the phase 3 maintenance program.            Nutrition & Weight - Outcomes:   Post Biometrics - 12/11/20 1330       Post  Biometrics   Height 5' 9.5" (1.765 m)    Weight 279 lb 12.2 oz (126.9 kg)    Waist Circumference 54.25 inches    Hip Circumference 50.25 inches    Waist to Hip Ratio 1.08 %    BMI (Calculated) 40.74     Triceps Skinfold 23 mm    % Body Fat 40.1 %    Grip Strength 44 kg    Flexibility 0 in    Single Leg Stand 10.52 seconds           Nutrition:  Nutrition Therapy & Goals - 12/10/20 0905      Nutrition Therapy   Diet TLC    Drug/Food Interactions Statins/Certain Fruits      Personal Nutrition Goals   Nutrition Goal Pt to identify food quantities necessary to achieve weight loss of 6-24 lb at graduation from cardiac rehab.    Personal Goal #2 Pt to build a healthy plate including vegetables, fruits, whole grains, and low-fat dairy products in a heart healthy meal plan.      Intervention Plan   Intervention Prescribe, educate and counsel regarding individualized specific dietary modifications aiming towards targeted core components such as weight, hypertension, lipid management, diabetes, heart failure and other comorbidities.    Expected Outcomes Short Term Goal: Understand basic principles of dietary content, such as calories, fat, sodium, cholesterol and nutrients.           Nutrition Discharge:  Nutrition Assessments - 12/24/20 1202      MEDFICTS Scores   Post Score 106           Education Questionnaire Score:  Knowledge Questionnaire Score - 12/11/20 1440      Knowledge Questionnaire Score   Post Score 22/24           Goals reviewed with patient; copy given to patient.

## 2021-01-16 MED FILL — WEGOVY 0.25 MG/0.5ML SOAJ: 0.25 | 28 days supply | Qty: 2 | Fill #0

## 2021-01-21 ENCOUNTER — Encounter: Payer: Self-pay | Admitting: Family Medicine

## 2021-01-21 DIAGNOSIS — K625 Hemorrhage of anus and rectum: Secondary | ICD-10-CM

## 2021-01-27 ENCOUNTER — Ambulatory Visit (INDEPENDENT_AMBULATORY_CARE_PROVIDER_SITE_OTHER): Payer: No Typology Code available for payment source | Admitting: Family Medicine

## 2021-01-27 ENCOUNTER — Encounter (INDEPENDENT_AMBULATORY_CARE_PROVIDER_SITE_OTHER): Payer: Self-pay | Admitting: Family Medicine

## 2021-01-27 ENCOUNTER — Other Ambulatory Visit: Payer: Self-pay

## 2021-01-27 VITALS — BP 125/74 | HR 51 | Temp 97.9°F | Ht 69.0 in | Wt 274.0 lb

## 2021-01-27 DIAGNOSIS — E8881 Metabolic syndrome: Secondary | ICD-10-CM | POA: Diagnosis not present

## 2021-01-27 DIAGNOSIS — Z6841 Body Mass Index (BMI) 40.0 and over, adult: Secondary | ICD-10-CM

## 2021-01-27 DIAGNOSIS — F3289 Other specified depressive episodes: Secondary | ICD-10-CM

## 2021-01-27 DIAGNOSIS — Z9189 Other specified personal risk factors, not elsewhere classified: Secondary | ICD-10-CM | POA: Diagnosis not present

## 2021-01-27 DIAGNOSIS — I10 Essential (primary) hypertension: Secondary | ICD-10-CM

## 2021-01-27 DIAGNOSIS — E65 Localized adiposity: Secondary | ICD-10-CM

## 2021-01-27 NOTE — Progress Notes (Signed)
Chief Complaint:   OBESITY Hector [name] is here to discuss his progress with his obesity treatment plan along with follow-up of his obesity related diagnoses.   Today's visit was #: 20 Starting weight: 282 lbs Starting date: 01/30/2020 Today's weight: 274 lbs Today's date: 01/27/2021 Total lbs lost to date: 8 lbs Body mass index is 40.46 kg/m.  Total weight loss percentage to date: -2.84%  Interim History: Hector [name] says he has started online dating.  He has also restarted Wegovy. Nutrition Plan: the Category 2 Plan for 25% of the time.  Anti-obesity medications: Wegovy. Reported side effects: None. Activity: None at this time.  Assessment/Plan:   1. Visceral obesity Current visceral fat rating: 22. Visceral fat rating should be < 13. Visceral adipose tissue is a hormonally active component of total body fat. This body composition phenotype is associated with medical disorders such as metabolic syndrome, cardiovascular disease and several malignancies including prostate, breast, and colorectal cancers. Starting goal: Lose 7-10% of starting weight.   2. Essential hypertension At goal. Medications: losartan and Bystolic.   Plan: Avoid buying foods that are: processed, frozen, or prepackaged to avoid excess salt. We will continue to monitor symptoms as they relate to his weight loss journey.  BP Readings from Last 3 Encounters:  01/27/21 125/74  12/05/20 (!) 147/81  12/02/20 138/86   Lab Results  Component Value Date   CREATININE 1.16 12/02/2020   3. Metabolic syndrome Starting goal: Lose 7-10% of starting weight. He will continue to focus on protein-rich, low simple carbohydrate foods. We reviewed the importance of hydration, regular exercise for stress reduction, and restorative sleep.  We will continue to check lab work every 3 months, with 10% weight loss, or should any other concerns arise.  4. Other depression, emotional eating Hector [name] is taking Prozac 10 mg daily.  Behavior  modification techniques were discussed today to help Hector [name] deal with his emotional/non-hunger eating behaviors.    5. At risk for heart disease Due to Arrie's current state of health and medical condition(s), he is at a higher risk for heart disease.  This puts the patient at much greater risk to subsequently develop cardiopulmonary conditions that can significantly affect patient's quality of life in a negative manner.    At least 8 minutes were spent on counseling Hector [name] about these concerns today, and I stressed the importance of reversing risks factors of obesity, especially truncal and visceral fat, hypertension, hyperlipidemia, and pre-diabetes.  The initial goal is to lose at least 5-10% of starting weight to help reduce these risk factors.  Counseling:  Intensive lifestyle modifications were discussed with Hector [name] as the most appropriate first line of treatment.  he will continue to work on diet, exercise, and weight loss efforts.  We will continue to reassess these conditions on a fairly regular basis in an attempt to decrease the patient's overall morbidity and mortality.  Evidence-based interventions for health behavior change were utilized today including the discussion of self monitoring techniques, problem-solving barriers, and SMART goal setting techniques.  Specifically, regarding patient's less desirable eating habits and patterns, we employed the technique of small changes when Hector [name] has not been able to fully commit to his prudent nutritional plan.  6. Class 3 severe obesity with serious comorbidity and body mass index (BMI) of 40.0 to 44.9 in adult, unspecified obesity type ([place])  Course: Hector [name] is currently in the action stage of change. As such, his goal is to continue with weight loss efforts.   Nutrition goals:  He has agreed to the Category 2 Plan.   Exercise goals: For substantial health benefits, adults should do at least 150 minutes (2 hours and 30 minutes) a week of  moderate-intensity, or 75 minutes (1 hour and 15 minutes) a week of vigorous-intensity aerobic physical activity, or an equivalent combination of moderate- and vigorous-intensity aerobic activity. Aerobic activity should be performed in episodes of at least 10 minutes, and preferably, it should be spread throughout the week.  Behavioral modification strategies: increasing lean protein intake, decreasing simple carbohydrates, increasing vegetables and increasing water intake.  Hector [name] has agreed to follow-up with our clinic in 3 weeks. He was informed of the importance of frequent follow-up visits to maximize his success with intensive lifestyle modifications for his multiple health conditions.   Objective:   Blood pressure 125/74, pulse (!) 51, temperature 97.9 F (36.6 C), temperature source Oral, height 5' 9"  (1.753 m), weight 274 lb (124.3 kg), SpO2 97 %. Body mass index is 40.46 kg/m.  General: Cooperative, alert, well developed, in no acute distress. HEENT: Conjunctivae and lids unremarkable. Cardiovascular: Regular rhythm.  Lungs: Normal work of breathing. Neurologic: No focal deficits.   Lab Results  Component Value Date   CREATININE 1.16 12/02/2020   BUN 25 (H) 12/02/2020   NA 141 12/02/2020   K 4.2 12/02/2020   CL 107 (H) 12/02/2020   CO2 21 12/02/2020   Lab Results  Component Value Date   ALT 18 12/02/2020   AST 16 12/02/2020   ALKPHOS 71 12/02/2020   BILITOT 0.4 12/02/2020   Lab Results  Component Value Date   HGBA1C 5.2 11/16/2019   Lab Results  Component Value Date   INSULIN 35.9 (H) 11/16/2019   Lab Results  Component Value Date   TSH 2.410 11/16/2019   Lab Results  Component Value Date   CHOL 135 12/02/2020   HDL 34 (L) 12/02/2020   LDLCALC 83 12/02/2020   TRIG 97 12/02/2020   CHOLHDL 4.0 12/02/2020   Lab Results  Component Value Date   WBC 7.0 12/02/2020   HGB 15.0 12/02/2020   HCT 44.4 12/02/2020   MCV 89 12/02/2020   PLT 166 12/02/2020    Attestation Statements:   Reviewed by clinician on day of visit: allergies, medications, problem list, medical history, surgical history, family history, social history, and previous encounter notes.  I, Water quality scientist, CMA, am acting as transcriptionist for Briscoe Deutscher, DO  I have reviewed the above documentation for accuracy and completeness, and I agree with the above. Briscoe Deutscher, DO

## 2021-01-31 ENCOUNTER — Encounter: Payer: Self-pay | Admitting: Physician Assistant

## 2021-02-03 ENCOUNTER — Ambulatory Visit (INDEPENDENT_AMBULATORY_CARE_PROVIDER_SITE_OTHER): Payer: No Typology Code available for payment source | Admitting: Psychology

## 2021-02-03 DIAGNOSIS — F4322 Adjustment disorder with anxiety: Secondary | ICD-10-CM

## 2021-02-12 ENCOUNTER — Ambulatory Visit (INDEPENDENT_AMBULATORY_CARE_PROVIDER_SITE_OTHER): Payer: No Typology Code available for payment source | Admitting: Physician Assistant

## 2021-02-12 ENCOUNTER — Telehealth: Payer: Self-pay

## 2021-02-12 ENCOUNTER — Encounter: Payer: Self-pay | Admitting: Physician Assistant

## 2021-02-12 ENCOUNTER — Other Ambulatory Visit: Payer: Self-pay

## 2021-02-12 VITALS — BP 140/80 | HR 64 | Ht 69.0 in | Wt 280.4 lb

## 2021-02-12 DIAGNOSIS — L309 Dermatitis, unspecified: Secondary | ICD-10-CM

## 2021-02-12 DIAGNOSIS — K625 Hemorrhage of anus and rectum: Secondary | ICD-10-CM

## 2021-02-12 DIAGNOSIS — K580 Irritable bowel syndrome with diarrhea: Secondary | ICD-10-CM | POA: Diagnosis not present

## 2021-02-12 MED ORDER — HYDROCORTISONE (PERIANAL) 2.5 % EX CREA: 1.0000 "application " | TOPICAL_CREAM | Freq: Two times a day (BID) | CUTANEOUS | 1 refills | Status: DC

## 2021-02-12 MED ORDER — PLENVU 140 G PO SOLR: ORAL | 0 refills | Status: DC

## 2021-02-12 NOTE — Telephone Encounter (Signed)
If this is a routine screening colonoscopy, would hold off for another 6 months before holding antiplatelet therapy.  If there is a bleeding issue that makes the colonoscopy more urgent, then we can readdress.

## 2021-02-12 NOTE — Progress Notes (Signed)
Chief Complaint: Rectal bleeding  HPI:    Hector [name] is a 50 year old male with a past medical history of CAD status post stent 08/19/2020 on Brilinta, alcohol abuse, anxiety and multiple others listed below, known to Dr. Hilarie Fredrickson for his IBS, who was referred to me by [name], MD for a complaint of rectal bleeding.      06/01/2016 office visit with Dr. Hilarie Fredrickson for IBS.  At that time described that he had taken a course of rifaximin 550 mg 3 times a day x14 days for IBS with diarrhea and he had improvement with his gas, bloating and belching.  His loose stools which were postprandial were better.  That time recommended fiber and VSL.    Today, the patient presents to clinic and explains that he has always had trouble with his stomach, in the past had gone on a low FODMAP diet for even just 3 to 4 days in a row and this will help, tells me he has noticed certain things including xylitol and white onions will bother him every time giving him diarrhea.  Tells me that last Fall he had diarrhea for 16 days straight and lost a look pounds and felt presyncopal often, he was having 10 stools per day and developed a lot of rectal irritation at that time and saw some bright red blood on the toilet paper.  Explains he had stool studies which were negative and eventually the symptoms went back to his normal diarrhea which is just here there depending on what he is eating.  His major concern today is that he has continued with bright red blood per rectum, describing that most recently has even seen some "drops of blood" in the toilet, tells me that he also uses a WaterPik in the shower and sometimes when spraying up near his rectum it is painful.    Also describes some sulfur belching if he eats the wrong thing again, this it all cleared up with a low FODMAP diet in the past.    Describes recently being started on Brilinta after a stent placement in August of last year.  Due to this he is slightly nervous to come  off of his blood thinner for colonoscopy.    Denies fever, chills, weight loss, change in bowel habits or symptoms that awaken him from sleep.   Past Medical History:  Diagnosis Date  . Alcohol abuse   . Allergy   . Anal fissure   . Anxiety   . Asthma   . Back pain   . Bilateral swelling of feet   . Blood in stool   . Chest pain   . Coronary artery disease   . Depression   . Drug use   . Febrile seizure ([place])   . Heartburn   . Hyperlipidemia   . Hypertension   . Irritable bowel syndrome with diarrhea   . Jaundice, newborn   . Palpitations   . Panic   . Panic disorder   . Pre-diabetes   . Seizures ([facility])    febrile  . Shortness of breath   . Sleep apnea     Past Surgical History:  Procedure Laterality Date  . CARDIAC CATHETERIZATION    . CORONARY STENT INTERVENTION N/A 08/19/2020   Procedure: CORONARY STENT INTERVENTION;  Surgeon: Jettie Booze, MD;  Location: [facility];  Service: Cardiovascular;  Laterality: N/A;  . HEMORRHOID BANDING    . INTRAVASCULAR ULTRASOUND/IVUS N/A 08/19/2020   Procedure: Intravascular  Ultrasound/IVUS;  Surgeon: Jettie Booze, MD;  Location: [facility];  Service: Cardiovascular;  Laterality: N/A;  . LEFT HEART CATH AND CORONARY ANGIOGRAPHY N/A 08/19/2020   Procedure: LEFT HEART CATH AND CORONARY ANGIOGRAPHY;  Surgeon: Jettie Booze, MD;  Location: [facility];  Service: Cardiovascular;  Laterality: N/A;  . WISDOM TOOTH EXTRACTION  1991    Current Outpatient Medications  Medication Sig Dispense Refill  . aspirin EC 81 MG tablet Take 1 tablet (81 mg total) by mouth daily. Swallow whole. 150 tablet 2  . atorvastatin (LIPITOR) 80 MG tablet Take 1 tablet (80 mg total) by mouth daily. 90 tablet 3  . FLUoxetine (PROZAC) 10 MG capsule TAKE 1 CAPSULE DAILY 90 capsule 3  . furosemide (LASIX) 40 MG tablet Take 1 tablet (40 mg total) by mouth daily as needed for edema. 30 tablet 0  . LORazepam (ATIVAN) 1 MG  tablet TAKE 1 TABLET (1 MG TOTAL) BY MOUTH EVERY 8 (EIGHT) HOURS AS NEEDED FOR ANXIETY. 30 tablet 0  . losartan (COZAAR) 50 MG tablet Take 1 tablet (50 mg total) by mouth daily. 90 tablet 3  . mupirocin ointment (BACTROBAN) 2 % Apply 1 application topically 2 (two) times daily. 30 g 2  . nebivolol (BYSTOLIC) 10 MG tablet TAKE 1 TABLET BY MOUTH ONCE DAILY 90 tablet 1  . Semaglutide-Weight Management (WEGOVY) 0.25 MG/0.5ML SOAJ Inject 0.25 mg into the skin once a week. 1 mL 0  . Semaglutide-Weight Management (WEGOVY) 0.5 MG/0.5ML SOAJ Inject 0.5 mg into the skin once a week. 2 mL 0  . ticagrelor (BRILINTA) 90 MG TABS tablet Take 1 tablet (90 mg total) by mouth 2 (two) times daily. 180 tablet 3   No current facility-administered medications for this visit.    Allergies as of 02/12/2021  . (No Known Allergies)    Family History  Problem Relation Age of Onset  . Dementia Mother   . Stroke Mother   . Hypertension Mother   . Skin cancer Mother   . Basal cell carcinoma Mother   . Squamous cell carcinoma Mother   . Cancer Mother   . Depression Mother   . Diabetes Father   . Hyperlipidemia Father   . Stroke Father   . Heart disease Father 85  . Colon polyps Father   . Heart attack Father   . Hypertension Father   . Depression Father   . Sleep apnea Father   . Obesity Father   . Colitis Brother   . Colon polyps Brother     Social History   Socioeconomic History  . Marital status: Legally Separated    Spouse name: Not on file  . Number of children: 1  . Years of education: Not on file  . Highest education level: Bachelor's degree (e.g., BA, AB, BS)  Occupational History  . Occupation: Herbalist  Tobacco Use  . Smoking status: Never Smoker  . Smokeless tobacco: Never Used  Vaping Use  . Vaping Use: Former  Substance and Sexual Activity  . Alcohol use: Yes    Alcohol/week: 0.0 standard drinks    Comment: 1-5 per week  . Drug use: Yes    Types: Marijuana     Comment: Reports sometimes he smokes daily sometimes weekly  . Sexual activity: Not on file  Other Topics Concern  . Not on file  Social History Narrative   Patient is right-handed. He lives in a 2 story home. He drinks 2 20 oz  sodas a day. He does not exercise.   Social Determinants of Health   Financial Resource Strain: Not on file  Food Insecurity: Not on file  Transportation Needs: Not on file  Physical Activity: Not on file  Stress: Not on file  Social Connections: Not on file  Intimate Partner Violence: Not on file    Review of Systems:    Constitutional: No weight loss, fever or chills Skin: No rash Cardiovascular: No chest pain Respiratory: No SOB Gastrointestinal: See HPI and otherwise negative Genitourinary: No dysuria  Neurological: No headache, dizziness or syncope Musculoskeletal: No new muscle or joint pain Hematologic: No bruising Psychiatric: No history of depression or anxiety   Physical Exam:  Vital signs: BP 140/80   Pulse 64   Ht 5' 9"  (1.753 m)   Wt 280 lb 6.4 oz (127.2 kg)   SpO2 96%   BMI 41.41 kg/m    Constitutional:   Pleasant obese Caucasian male appears to be in NAD, Well developed, Well nourished, alert and cooperative Head:  Normocephalic and atraumatic. Eyes:   PEERL, EOMI. No icterus. Conjunctiva pink. Ears:  Normal auditory acuity. Neck:  Supple Throat: Oral cavity and pharynx without inflammation, swelling or lesion.  Respiratory: Respirations even and unlabored. Lungs clear to auscultation bilaterally.   No wheezes, crackles, or rhonchi.  Cardiovascular: Normal S1, S2. No MRG. Regular rate and rhythm. No peripheral edema, cyanosis or pallor.  Gastrointestinal:  Soft, nondistended, nontender. No rebound or guarding. Normal bowel sounds. No appreciable masses or hepatomegaly. Rectal:  + dermatitis with open excoriations in gluteal cleft, moist, also external tags near rectum; Internal: no mass, no discharge, no ttp; Anoscopy: Grade I  internal hemorrhoids, no sign of bleeding Msk:  Symmetrical without gross deformities. Without edema, no deformity or joint abnormality.  Neurologic:  Alert and  oriented x4;  grossly normal neurologically.  Skin:   Dry and intact without significant lesions or rashes. Psychiatric: Demonstrates good judgement and reason without abnormal affect or behaviors.  RELEVANT LABS AND IMAGING: CBC    Component Value Date/Time   WBC 7.0 12/02/2020 0932   WBC 4.5 08/07/2020 1233   RBC 5.00 12/02/2020 0932   RBC 5.15 08/07/2020 1233   HGB 15.0 12/02/2020 0932   HCT 44.4 12/02/2020 0932   PLT 166 12/02/2020 0932   MCV 89 12/02/2020 0932   MCH 30.0 12/02/2020 0932   MCH 29.5 08/07/2020 1233   MCHC 33.8 12/02/2020 0932   MCHC 34.4 08/07/2020 1233   RDW 13.4 12/02/2020 0932   LYMPHSABS 1.8 11/16/2019 1007   MONOABS 0.6 04/13/2018 1413   EOSABS 0.3 11/16/2019 1007   BASOSABS 0.1 11/16/2019 1007    CMP     Component Value Date/Time   NA 141 12/02/2020 0932   K 4.2 12/02/2020 0932   CL 107 (H) 12/02/2020 0932   CO2 21 12/02/2020 0932   GLUCOSE 106 (H) 12/02/2020 0932   GLUCOSE 120 (H) 09/26/2020 1050   BUN 25 (H) 12/02/2020 0932   CREATININE 1.16 12/02/2020 0932   CREATININE 1.32 09/26/2020 1050   CALCIUM 8.8 12/02/2020 0932   PROT 6.6 12/02/2020 0932   ALBUMIN 4.2 12/02/2020 0932   AST 16 12/02/2020 0932   ALT 18 12/02/2020 0932   ALKPHOS 71 12/02/2020 0932   BILITOT 0.4 12/02/2020 0932   GFRNONAA 74 12/02/2020 0932   GFRAA 85 12/02/2020 0932    Assessment: 1.  IBS-D: Frequent loose stools which are "somewhat normal" for him, previously treated for IBS which  is likely the cause now 2.  Rectal bleeding: Consider hemorrhoids versus other  Plan: 1.  Told patient that he has external lesions/dermatitis around his buttocks likely from moisture and excessive wiping.  Asked that he keep this area clean and dry.  Also prescribed Hydrocortisone ointment to be applied twice daily for 2  weeks. 2.  Recommend the patient schedule for a colonoscopy given his excessive rectal bleeding and no true source found today.  He was scheduled with Dr. Hilarie Fredrickson in the [facility].  He has had his COVID vaccines.  Patient provided with a detailed list of risks for the procedure and he agrees to proceed. 3.  We will contact his cardiologist Dr. Baker Janus to discuss holding his Brilinta for 5 days prior to time of procedure. 4.  Patient to follow in clinic per recommendations from Dr. Hilarie Fredrickson after time of procedure.  Ellouise Newer, PA-C [facility] 02/12/2021, 1:24 PM  Cc: [name], MD

## 2021-02-12 NOTE — Telephone Encounter (Signed)
[facility] Pre-operative Risk Assessment     Request for surgical clearance:     Endoscopy Procedure  What type of surgery is being performed?     Colonocopy  When is this surgery scheduled?     02/25/21  What type of clearance is required ?   Pharmacy  Are there any medications that need to be held prior to surgery and how long? Brilinta 5 days  Practice name and name of physician performing surgery?      [facility]  What is your office phone and fax number?      Phone- [phone]  [phone]  Anesthesia type (None, local, MAC, general) ?       MAC

## 2021-02-12 NOTE — Telephone Encounter (Signed)
Pt < 12 mos out from PCI with DES to mLAD. Will fwd to Dr. Irish Lack to get input if ok to hold Brilinta once > 6 mos out vs waiting until 07/2021 to hold Brilinta.  Richardson Dopp, PA-C    02/12/2021 2:28 PM

## 2021-02-12 NOTE — Patient Instructions (Addendum)
If you are age 50 or older, your body mass index should be between 23-30. Your Body mass index is 41.41 kg/m. If this is out of the aforementioned range listed, please consider follow up with your Primary Care Provider.  If you are age 32 or younger, your body mass index should be between 19-25. Your Body mass index is 41.41 kg/m. If this is out of the aformentioned range listed, please consider follow up with your Primary Care Provider.   We have sent the following medications to your pharmacy for you to pick up at your convenience:Hydrocortisone ointment around the rectum twice daily for 2 weeks.  You have been scheduled for a colonoscopy. Please follow written instructions given to you at your visit today.  Please pick up your prep supplies at the pharmacy within the next 1-3 days. If you use inhalers (even only as needed), please bring them with you on the day of your procedure.   Thank you for choosing me and [facility].  Ellouise Newer, PA-C

## 2021-02-12 NOTE — Telephone Encounter (Signed)
   Please see notes from Dr. Irish Lack. Patient had a drug eluting stent placed in 07/2020.  Ideally he should wait a min of 12 mos before stopping Brilinta. It would be best to hold off until Aug 2022 unless this procedure needs to be done urgently/sooner. If it needs to be done sooner, please reply to (1) [facility] PREOP and (2) Dr. Irish Lack so we can address. Richardson Dopp, PA-C    02/12/2021 4:33 PM

## 2021-02-14 NOTE — Telephone Encounter (Signed)
   Primary Cardiologist: Larae Grooms, MD  Chart reviewed as part of pre-operative protocol coverage. Because of Angelito A Monsivais's past medical history and time since last visit, recommend not holding Brilinta ta this time as he is <12 months out from PCI with DES to mLAD performed 08/19/20. Per primary cardiologist,   "If this is a routine screening colonoscopy, would hold off another 6 months before holding antiplatelet therapy. If there is bleeding issue that makes the colonoscopy more urgent, then we can readdress".   Will route to requesting surgeon's office via preferred method (i.e, phone, fax) to inform them of recommendations. If procedure is urgent, anticipate repeat or updated request will be received and can be addressed at that time.   Will remove from preop pool.  Loel Dubonnet, NP  02/14/2021, 8:24 AM

## 2021-02-14 NOTE — Telephone Encounter (Signed)
Spoke with patient and let him know that Cardiology would like him to wait until August to have procedure unless there is a bleeding issue, Informed patient that his procedure would be cancelled and he would be put in recall for August 2022.

## 2021-02-14 NOTE — Telephone Encounter (Signed)
Called patient and left a voicemail for him to call me back.

## 2021-02-15 NOTE — Progress Notes (Signed)
Addendum: Reviewed and agree with assessment and management plan. Eoin Willden M, MD  

## 2021-02-19 ENCOUNTER — Other Ambulatory Visit: Payer: Self-pay

## 2021-02-19 ENCOUNTER — Ambulatory Visit (INDEPENDENT_AMBULATORY_CARE_PROVIDER_SITE_OTHER): Payer: No Typology Code available for payment source | Admitting: Family Medicine

## 2021-02-19 ENCOUNTER — Encounter (INDEPENDENT_AMBULATORY_CARE_PROVIDER_SITE_OTHER): Payer: Self-pay | Admitting: Family Medicine

## 2021-02-19 VITALS — BP 120/75 | HR 68 | Temp 97.9°F | Ht 69.0 in | Wt 277.0 lb

## 2021-02-19 DIAGNOSIS — E65 Localized adiposity: Secondary | ICD-10-CM | POA: Diagnosis not present

## 2021-02-19 DIAGNOSIS — E8881 Metabolic syndrome: Secondary | ICD-10-CM | POA: Diagnosis not present

## 2021-02-19 DIAGNOSIS — F3289 Other specified depressive episodes: Secondary | ICD-10-CM | POA: Diagnosis not present

## 2021-02-19 DIAGNOSIS — Z6841 Body Mass Index (BMI) 40.0 and over, adult: Secondary | ICD-10-CM

## 2021-02-19 DIAGNOSIS — Z9189 Other specified personal risk factors, not elsewhere classified: Secondary | ICD-10-CM

## 2021-02-19 NOTE — Patient Instructions (Signed)
Vaginal moisturizers and lubricants  Product (manufacturer) Ingredients Notes  Moisturizers    Replens Water, carbomer, polycarbophil, paraffin, hydrogenated palm oil, glyceride, sorbic acid, and sodium hydroxide Should be used 3 times weekly  Me AgainT Water, carbomer, aloe, citric acid, chlorhexidine deglutinate, sodium benzoate, potassium sorbate, diazolidinyl urea, and sorbic acid    Vagisil Feminine Moisturizer Water, glycerin, propylene glycol, poloxamer 407, methylparaben, polyquaternium-32, propylparaben, chamomile, and aloe    Feminease Water, mineral oil, glycerin, yerba santa, cetyl alcohol, and methyl paraben Yerba santa (Eriodictyon spp), a plant native to the [facility], is used as a moisturizer in place of aloe  K-Y [employer], propylene glycol, sorbitol, polysorbate 60, hydroxyethylcellulose, benzoic acid, methylparaben, tocopherol, and aloe         Lubricants    Water-based    Slippery Stuff Water, polyoxyethylene, methylparaben, propylene glycol, isopropynol    Astroglide Water, glycerin, methylparaben, propylparaben, polypropylene glycol, polyquaternium, hydroxyethylcellulose, and sodium benzoate Also sold in a glycerin-free and paraben-free formulation  K-Y Jelly Water, glycerin, hydroxyethylcellulose, parabens, and chlorhexidine    Pre-Seed Water, hydroxyethylcellulose, arabinogalactan, paraben, and Pluronic copolymers Promoted to women and their partners who are trying to conceive  Silicone-based    ID Millennium Cyclomethicone, dimethicone, and dimethiconol Less drying than other lubricants  Pjur Eros Cyclopentasiloxane, dimethicone, and dimethiconol Compatible with a condom  PinkT Dimethicone, vitamin E, aloe vera, dimethiconol, and cyclomethicone Compatible with a condom  Oil-based    Elgance Women's Lubricant Natural oils Does not contain alcohol, glycerin, or parabens; is not compatible with a condom

## 2021-02-20 ENCOUNTER — Encounter (INDEPENDENT_AMBULATORY_CARE_PROVIDER_SITE_OTHER): Payer: Self-pay | Admitting: Family Medicine

## 2021-02-20 NOTE — Progress Notes (Signed)
Chief Complaint:   OBESITY Hector [name] is here to discuss his progress with his obesity treatment plan along with follow-up of his obesity related diagnoses.   Today's visit was #: 21 Starting weight: 282 lbs Starting date: 01/30/2020 Today's weight: 277 lbs Today's date:  02/19/2021 Total lbs lost to date: 5 lbs Body mass index is 40.91 kg/m.  Total weight loss percentage to date: -1.77%  Interim History:  Sarthak stopped Wegovy due to indigestion.  Will restart.  Cannot eat spicy [place] food.  He says he is successfully dating.  He is engaging in some high risk behavior (THC, ETOH).  Current Meal Plan: the Category 2 Plan for 30-40% of the time.  Current Exercise Plan: None at this time.  Assessment/Plan:   1. Metabolic syndrome Starting goal: Lose 7-10% of starting weight. He will continue to focus on protein-rich, low simple carbohydrate foods. We reviewed the importance of hydration, regular exercise for stress reduction, and restorative sleep.  We will continue to check lab work every 3 months, with 10% weight loss, or should any other concerns arise.  2. Visceral obesity Current visceral fat rating: 22. Visceral fat rating should be < 13. Visceral adipose tissue is a hormonally active component of total body fat. This body composition phenotype is associated with medical disorders such as metabolic syndrome, cardiovascular disease and several malignancies including prostate, breast, and colorectal cancers. Starting goal: Lose 7-10% of starting weight.   3. Other depression, emotional eating Medication: Prozac 10 mg daily.  Plan:  Behavior modification techniques were discussed today to help deal with emotional/non-hunger eating behaviors.  4. At risk for heart disease Due to Jquan's current state of health and medical condition(s), he is at a higher risk for heart disease.  This puts the patient at much greater risk to subsequently develop cardiopulmonary conditions that can  significantly affect patient's quality of life in a negative manner.    At least 9 minutes were spent on counseling Hisham about these concerns today. Counseling:  Intensive lifestyle modifications were discussed with [place] as the most appropriate first line of treatment.  he will continue to work on diet, exercise, and weight loss efforts.  We will continue to reassess these conditions on a fairly regular basis in an attempt to decrease the patient's overall morbidity and mortality.  Evidence-based interventions for health behavior change were utilized today including the discussion of self monitoring techniques, problem-solving barriers, and SMART goal setting techniques.  Specifically, regarding patient's less desirable eating habits and patterns, we employed the technique of small changes when Galdino has not been able to fully commit to his prudent nutritional plan.  5. Class 3 severe obesity with serious comorbidity and body mass index (BMI) of 40.0 to 44.9 in adult, unspecified obesity type ([place])  Course: Cid is currently in the action stage of change. As such, his goal is to continue with weight loss efforts.   Nutrition goals: He has agreed to the Category 2 Plan.   Exercise goals: For substantial health benefits, adults should do at least 150 minutes (2 hours and 30 minutes) a week of moderate-intensity, or 75 minutes (1 hour and 15 minutes) a week of vigorous-intensity aerobic physical activity, or an equivalent combination of moderate- and vigorous-intensity aerobic activity. Aerobic activity should be performed in episodes of at least 10 minutes, and preferably, it should be spread throughout the week.  Behavioral modification strategies: increasing lean protein intake, decreasing simple carbohydrates, increasing vegetables, increasing water intake and decreasing  liquid calories.  Marguis has agreed to follow-up with our clinic in 3 weeks. He was informed of the importance of frequent follow-up  visits to maximize his success with intensive lifestyle modifications for his multiple health conditions.   Objective:   Blood pressure 120/75, pulse 68, temperature 97.9 F (36.6 C), temperature source Oral, height 5' 9"  (1.753 m), weight 277 lb (125.6 kg), SpO2 97 %. Body mass index is 40.91 kg/m.  General: Cooperative, alert, well developed, in no acute distress. HEENT: Conjunctivae and lids unremarkable. Cardiovascular: Regular rhythm.  Lungs: Normal work of breathing. Neurologic: No focal deficits.   Lab Results  Component Value Date   CREATININE 1.16 12/02/2020   BUN 25 (H) 12/02/2020   NA 141 12/02/2020   K 4.2 12/02/2020   CL 107 (H) 12/02/2020   CO2 21 12/02/2020   Lab Results  Component Value Date   ALT 18 12/02/2020   AST 16 12/02/2020   ALKPHOS 71 12/02/2020   BILITOT 0.4 12/02/2020   Lab Results  Component Value Date   HGBA1C 5.2 11/16/2019   Lab Results  Component Value Date   INSULIN 35.9 (H) 11/16/2019   Lab Results  Component Value Date   TSH 2.410 11/16/2019   Lab Results  Component Value Date   CHOL 135 12/02/2020   HDL 34 (L) 12/02/2020   LDLCALC 83 12/02/2020   TRIG 97 12/02/2020   CHOLHDL 4.0 12/02/2020   Lab Results  Component Value Date   WBC 7.0 12/02/2020   HGB 15.0 12/02/2020   HCT 44.4 12/02/2020   MCV 89 12/02/2020   PLT 166 12/02/2020   Attestation Statements:   Reviewed by clinician on day of visit: allergies, medications, problem list, medical history, surgical history, family history, social history, and previous encounter notes.  I, Water quality scientist, CMA, am acting as transcriptionist for Briscoe Deutscher, DO  I have reviewed the above documentation for accuracy and completeness, and I agree with the above. Briscoe Deutscher, DO

## 2021-02-24 ENCOUNTER — Ambulatory Visit (INDEPENDENT_AMBULATORY_CARE_PROVIDER_SITE_OTHER): Payer: No Typology Code available for payment source | Admitting: Psychology

## 2021-02-24 DIAGNOSIS — F4322 Adjustment disorder with anxiety: Secondary | ICD-10-CM | POA: Diagnosis not present

## 2021-02-25 ENCOUNTER — Encounter: Payer: No Typology Code available for payment source | Admitting: Internal Medicine

## 2021-03-10 ENCOUNTER — Other Ambulatory Visit: Payer: Self-pay | Admitting: Family Medicine

## 2021-03-10 NOTE — Telephone Encounter (Signed)
Okay to refill? It looks like Cardiology may be prescribing this.

## 2021-03-10 NOTE — Telephone Encounter (Signed)
Okay to refill for 6 months

## 2021-03-17 ENCOUNTER — Ambulatory Visit (INDEPENDENT_AMBULATORY_CARE_PROVIDER_SITE_OTHER): Payer: No Typology Code available for payment source | Admitting: Psychology

## 2021-03-17 DIAGNOSIS — F4322 Adjustment disorder with anxiety: Secondary | ICD-10-CM | POA: Diagnosis not present

## 2021-03-31 ENCOUNTER — Other Ambulatory Visit: Payer: Self-pay

## 2021-03-31 ENCOUNTER — Ambulatory Visit (INDEPENDENT_AMBULATORY_CARE_PROVIDER_SITE_OTHER): Payer: No Typology Code available for payment source | Admitting: Family Medicine

## 2021-03-31 ENCOUNTER — Encounter (INDEPENDENT_AMBULATORY_CARE_PROVIDER_SITE_OTHER): Payer: Self-pay | Admitting: Family Medicine

## 2021-03-31 VITALS — BP 142/74 | HR 57 | Temp 97.8°F | Ht 69.0 in | Wt 283.0 lb

## 2021-03-31 DIAGNOSIS — E8881 Metabolic syndrome: Secondary | ICD-10-CM

## 2021-03-31 DIAGNOSIS — F3289 Other specified depressive episodes: Secondary | ICD-10-CM | POA: Diagnosis not present

## 2021-03-31 DIAGNOSIS — I2510 Atherosclerotic heart disease of native coronary artery without angina pectoris: Secondary | ICD-10-CM

## 2021-03-31 DIAGNOSIS — Z9189 Other specified personal risk factors, not elsewhere classified: Secondary | ICD-10-CM | POA: Diagnosis not present

## 2021-03-31 DIAGNOSIS — Z6841 Body Mass Index (BMI) 40.0 and over, adult: Secondary | ICD-10-CM

## 2021-04-07 ENCOUNTER — Ambulatory Visit (INDEPENDENT_AMBULATORY_CARE_PROVIDER_SITE_OTHER): Payer: No Typology Code available for payment source | Admitting: Psychology

## 2021-04-07 DIAGNOSIS — F4322 Adjustment disorder with anxiety: Secondary | ICD-10-CM

## 2021-04-07 NOTE — Progress Notes (Signed)
Chief Complaint:   OBESITY Hector [name] is here to discuss his progress with his obesity treatment plan along with follow-up of his obesity related diagnoses.   Today's visit was #: 22 Starting weight: 282 lbs Starting date: 01/30/2020 Today's weight: 283 lbs Today's date: 04/07/2021 Total lbs lost to date: +1 Body mass index is 41.79 kg/m.   Interim History: Hector [name] has not been taking [place]. Will consider Hector [name] and Hector [name]. His is having trouble with portion control. Drinking 1, 32 oz bottle of water. Goal is to increase to 2. He also wants to get a wok for stir fry.  Current Meal Plan: the Category 2 Plan for 25% of the time.  Current Exercise Plan: Walking with his sister 30-45 minutes 1-2 times a week..  Assessment/Plan:   1. Metabolic syndrome Starting goal: Lose 7-10% of starting weight. He will continue to focus on protein-rich, low simple carbohydrate foods. We reviewed the importance of hydration, regular exercise for stress reduction, and restorative sleep.  We will continue to check lab work every 3 months, with 10% weight loss, or should any other concerns arise.  2. Coronary artery disease involving native coronary artery of native heart without angina pectoris, s/p coronary angioplasty with stent Followed by Cardiology. No new symptoms. Compliant with medications, but not diet or exercise. We will continue to monitor symptoms as they relate to his weight loss journey.  3. Other depression, emotional eating Medication: Hector [name] 10 mg daily.  Plan: Behavior modification techniques were discussed today to help deal with emotional/non-hunger eating behaviors.  4. At risk for impaired metabolic function Due to Hector [name]'s current state of health and medical condition(s), he is at a significantly higher risk for impaired metabolic function.   At least 10 minutes was spent on counseling Hector [name] about these concerns today.  This places the patient at a much greater risk to  subsequently develop cardio-pulmonary conditions that can negatively affect the patient's quality of life.  I stressed the importance of reversing these risks factors.  The initial goal is to lose at least 5-10% of starting weight to help reduce risk factors.  Counseling:  Intensive lifestyle modifications discussed with Hector [name] as the most appropriate first line treatment.  5. Obesity, current BMI 41.9  Course: Hector [name] is currently in the action stage of change. As such, his goal is to continue with weight loss efforts.   Nutrition goals: He has agreed to the Category 2 Plan.   Exercise goals: For substantial health benefits, adults should do at least 150 minutes (2 hours and 30 minutes) a week of moderate-intensity, or 75 minutes (1 hour and 15 minutes) a week of vigorous-intensity aerobic physical activity, or an equivalent combination of moderate- and vigorous-intensity aerobic activity. Aerobic activity should be performed in episodes of at least 10 minutes, and preferably, it should be spread throughout the week.  Behavioral modification strategies: increasing lean protein intake, decreasing simple carbohydrates, increasing vegetables, increasing water intake and emotional eating strategies and decreasing alcohol intake.  Hector [name] has agreed to follow-up with our clinic in 4 weeks. He was informed of the importance of frequent follow-up visits to maximize his success with intensive lifestyle modifications for his multiple health conditions.   Objective:   Blood pressure (!) 142/74, pulse (!) 57, temperature 97.8 F (36.6 C), temperature source Oral, height 5' 9"  (1.753 m), weight 283 lb (128.4 kg), SpO2 96 %. Body mass index is 41.79 kg/m.  General: Cooperative, alert, well developed, in no acute distress. HEENT: Conjunctivae  and lids unremarkable. Cardiovascular: Regular rhythm.  Lungs: Normal work of breathing. Neurologic: No focal deficits.   Attestation Statements:   Reviewed by  clinician on day of visit: allergies, medications, problem list, medical history, surgical history, family history, social history, and previous encounter notes.  Hector [name], CMA, am acting as Location manager for [employer], DO.  I have reviewed the above documentation for accuracy and completeness, and I agree with the above. Hector Deutscher, DO

## 2021-04-14 ENCOUNTER — Encounter (INDEPENDENT_AMBULATORY_CARE_PROVIDER_SITE_OTHER): Payer: Self-pay | Admitting: Family Medicine

## 2021-04-14 ENCOUNTER — Ambulatory Visit (INDEPENDENT_AMBULATORY_CARE_PROVIDER_SITE_OTHER): Payer: No Typology Code available for payment source | Admitting: Family Medicine

## 2021-04-14 ENCOUNTER — Other Ambulatory Visit: Payer: Self-pay | Admitting: Family Medicine

## 2021-04-14 ENCOUNTER — Other Ambulatory Visit: Payer: Self-pay

## 2021-04-14 VITALS — BP 135/79 | HR 48 | Temp 97.9°F | Ht 69.0 in | Wt 284.0 lb

## 2021-04-14 DIAGNOSIS — R0602 Shortness of breath: Secondary | ICD-10-CM | POA: Diagnosis not present

## 2021-04-14 DIAGNOSIS — I2510 Atherosclerotic heart disease of native coronary artery without angina pectoris: Secondary | ICD-10-CM | POA: Diagnosis not present

## 2021-04-14 DIAGNOSIS — Z6841 Body Mass Index (BMI) 40.0 and over, adult: Secondary | ICD-10-CM

## 2021-04-14 DIAGNOSIS — F3289 Other specified depressive episodes: Secondary | ICD-10-CM

## 2021-04-14 DIAGNOSIS — Z9189 Other specified personal risk factors, not elsewhere classified: Secondary | ICD-10-CM

## 2021-04-15 NOTE — Progress Notes (Signed)
Chief Complaint:   OBESITY Hector [name] is here to discuss his progress with his obesity treatment plan along with follow-up of his obesity related diagnoses.   Today's visit was #: 23 Starting weight: 282 lbs Starting date: 01/30/2020 Today's weight: 284 lbs Today's date: 04/14/2021 Total lbs lost to date: 0 Body mass index is 41.94 kg/m.   Interim History:  Hector [name] has had increased shortness of breath.  He currently has a left toe injury and lower extremity edema.  He reports drinking more sodas. Current Meal Plan: the Category 2 Plan for 25% of the time.  Current Exercise Plan: Walking for 30-40 minutes 1-2 times per week.  Assessment/Plan:   1. Coronary artery disease involving native coronary artery of native heart without angina pectoris, s/p coronary angioplasty with stent Followed by Cardiology. Increased SOB with exertion. Compliant with medications but not with lifestyle changes.  We will continue to monitor symptoms as they relate to his weight loss journey.  2. SOB (shortness of breath) on exertion Hector [name] endorses increased shortness of breath.   3. Other depression, emotional eating Not at goal. Medication: Prozac 10 mg daily.  Plan:  Will place referral to Psychiatry today.  Behavior modification techniques were discussed today to help deal with emotional/non-hunger eating behaviors.  4. At risk for heart disease Due to Hector [name]'s current state of health and medical condition(s), he is at a higher risk for heart disease.  This puts the patient at much greater risk to subsequently develop cardiopulmonary conditions that can significantly affect patient's quality of life in a negative manner.    At least 8 minutes were spent on counseling Hector [name] about these concerns today. Evidence-based interventions for health behavior change were utilized today including the discussion of self monitoring techniques, problem-solving barriers, and SMART goal setting techniques.  Specifically,  regarding patient's less desirable eating habits and patterns, we employed the technique of small changes when Hector [name] has not been able to fully commit to his prudent nutritional plan.  5. Obesity, current BMI 42.1  Course: Hector [name] is currently in the action stage of change. As such, his goal is to continue with weight loss efforts.   Nutrition goals: He has agreed to the Category 2 Plan.   Exercise goals: For substantial health benefits, adults should do at least 150 minutes (2 hours and 30 minutes) a week of moderate-intensity, or 75 minutes (1 hour and 15 minutes) a week of vigorous-intensity aerobic physical activity, or an equivalent combination of moderate- and vigorous-intensity aerobic activity. Aerobic activity should be performed in episodes of at least 10 minutes, and preferably, it should be spread throughout the week.  Behavioral modification strategies: increasing lean protein intake, decreasing simple carbohydrates, increasing vegetables and increasing water intake.  Hector [name] has agreed to follow-up with our clinic in 3 weeks. He was informed of the importance of frequent follow-up visits to maximize his success with intensive lifestyle modifications for his multiple health conditions.   Objective:   Blood pressure 135/79, pulse (!) 48, temperature 97.9 F (36.6 C), temperature source Oral, height 5' 9"  (1.753 m), weight 284 lb (128.8 kg), SpO2 97 %. Body mass index is 41.94 kg/m.  General: Cooperative, alert, well developed, in no acute distress. HEENT: Conjunctivae and lids unremarkable. Cardiovascular: Regular rhythm.  Lungs: Normal work of breathing. Neurologic: No focal deficits.   Lab Results  Component Value Date   CREATININE 1.16 12/02/2020   BUN 25 (H) 12/02/2020   NA 141 12/02/2020   K 4.2 12/02/2020  CL 107 (H) 12/02/2020   CO2 21 12/02/2020   Lab Results  Component Value Date   ALT 18 12/02/2020   AST 16 12/02/2020   ALKPHOS 71 12/02/2020   BILITOT 0.4  12/02/2020   Lab Results  Component Value Date   HGBA1C 5.2 11/16/2019   Lab Results  Component Value Date   INSULIN 35.9 (H) 11/16/2019   Lab Results  Component Value Date   TSH 2.410 11/16/2019   Lab Results  Component Value Date   CHOL 135 12/02/2020   HDL 34 (L) 12/02/2020   LDLCALC 83 12/02/2020   TRIG 97 12/02/2020   CHOLHDL 4.0 12/02/2020   Lab Results  Component Value Date   WBC 7.0 12/02/2020   HGB 15.0 12/02/2020   HCT 44.4 12/02/2020   MCV 89 12/02/2020   PLT 166 12/02/2020   Attestation Statements:   Reviewed by clinician on day of visit: allergies, medications, problem list, medical history, surgical history, family history, social history, and previous encounter notes.  I, Water quality scientist, CMA, am acting as transcriptionist for Briscoe Deutscher, DO  I have reviewed the above documentation for accuracy and completeness, and I agree with the above. Briscoe Deutscher, DO

## 2021-04-28 ENCOUNTER — Ambulatory Visit: Payer: No Typology Code available for payment source | Admitting: Psychology

## 2021-05-10 ENCOUNTER — Other Ambulatory Visit: Payer: Self-pay | Admitting: Family Medicine

## 2021-05-14 ENCOUNTER — Encounter (INDEPENDENT_AMBULATORY_CARE_PROVIDER_SITE_OTHER): Payer: Self-pay | Admitting: Family Medicine

## 2021-05-14 ENCOUNTER — Other Ambulatory Visit: Payer: Self-pay

## 2021-05-14 ENCOUNTER — Ambulatory Visit (INDEPENDENT_AMBULATORY_CARE_PROVIDER_SITE_OTHER): Payer: No Typology Code available for payment source | Admitting: Family Medicine

## 2021-05-14 VITALS — BP 134/77 | HR 47 | Temp 97.9°F | Ht 69.0 in | Wt 287.0 lb

## 2021-05-14 DIAGNOSIS — I2510 Atherosclerotic heart disease of native coronary artery without angina pectoris: Secondary | ICD-10-CM

## 2021-05-14 DIAGNOSIS — F3289 Other specified depressive episodes: Secondary | ICD-10-CM | POA: Diagnosis not present

## 2021-05-14 DIAGNOSIS — E66813 Obesity, class 3: Secondary | ICD-10-CM

## 2021-05-14 DIAGNOSIS — E8881 Metabolic syndrome: Secondary | ICD-10-CM

## 2021-05-14 DIAGNOSIS — Z9189 Other specified personal risk factors, not elsewhere classified: Secondary | ICD-10-CM | POA: Diagnosis not present

## 2021-05-14 DIAGNOSIS — Z6841 Body Mass Index (BMI) 40.0 and over, adult: Secondary | ICD-10-CM

## 2021-05-20 NOTE — Progress Notes (Signed)
Chief Complaint:   OBESITY Hector [name] is here to discuss his progress with his obesity treatment plan along with follow-up of his obesity related diagnoses.   Today's visit was #: 24 Starting weight: 282 lbs Starting date: 01/30/2020 Today's weight: 287 lbs Today's date: 05/14/2021 Weight change since last visit: +3 lbs Total lbs lost to date: +5 lbs Body mass index is 42.38 kg/m.   Interim History:  Hector [name] says that Psychiatry left a message last Friday.  He will call today.  Hector [name], Therapist. Current Meal Plan: the Category 2 Plan for 20% of the time.  Current Exercise Plan: None.  Assessment/Plan:   1. Coronary artery disease involving native coronary artery of native heart without angina pectoris, s/p coronary angioplasty with stent Followed by Cardiology.  Compliant with medications but not with lifestyle changes.  We will continue to monitor symptoms as they relate tohisweight loss journey.  2. Metabolic syndrome Starting goal: Lose 7-10% of starting weight. He will continue to focus on protein-rich, low simple carbohydrate foods. We reviewed the importance of hydration, regular exercise for stress reduction, and restorative sleep.  We will continue to check lab work every 3 months, with 10% weight loss, or should any other concerns arise.  3. Other depression, emotional eating Not at goal. Medication: Prozac 10 mg daily.  Plan:  Continue Prozac.  Return call to Psychiatry.  Discussed cues and consequences, how thoughts affect eating, model of thoughts, feelings, and behaviors, and strategies for change by focusing on the cue. Discussed cognitive distortions, coping thoughts, and how to change your thoughts.  4. At risk for heart disease Due to Hector [name]'s current state of health and medical condition(s), he is at a higher risk for heart disease.  This puts the patient at much greater risk to subsequently develop cardiopulmonary conditions that can significantly affect  patient's quality of life in a negative manner.    At least 8 minutes were spent on counseling Hector [name] about these concerns today. Evidence-based interventions for health behavior change were utilized today including the discussion of self monitoring techniques, problem-solving barriers, and SMART goal setting techniques.  Specifically, regarding patient's less desirable eating habits and patterns, we employed the technique of small changes when Hector [name] has not been able to fully commit to his prudent nutritional plan.  5. Obesity, current BMI 42.4  Course: Hector [name] is currently in the action stage of change. As such, his goal is to continue with weight loss efforts.   Nutrition goals: He has agreed to the Category 2 Plan.   Exercise goals: For substantial health benefits, adults should do at least 150 minutes (2 hours and 30 minutes) a week of moderate-intensity, or 75 minutes (1 hour and 15 minutes) a week of vigorous-intensity aerobic physical activity, or an equivalent combination of moderate- and vigorous-intensity aerobic activity. Aerobic activity should be performed in episodes of at least 10 minutes, and preferably, it should be spread throughout the week.  Behavioral modification strategies: increasing lean protein intake, decreasing simple carbohydrates, increasing vegetables, decreasing liquid calories, decreasing alcohol intake, emotional eating strategies and decreasing junk food.  Hector [name] has agreed to follow-up with our clinic in 4 weeks. He was informed of the importance of frequent follow-up visits to maximize his success with intensive lifestyle modifications for his multiple health conditions.   Objective:   Blood pressure 134/77, pulse (!) 47, temperature 97.9 F (36.6 C), temperature source Oral, height 5' 9"  (1.753 m), weight 287 lb (130.2 kg), SpO2 98 %. Body  mass index is 42.38 kg/m.  General: Cooperative, alert, well developed, in no acute distress. HEENT: Conjunctivae and lids  unremarkable. Cardiovascular: Regular rhythm.  Lungs: Normal work of breathing. Neurologic: No focal deficits.   Lab Results  Component Value Date   CREATININE 1.16 12/02/2020   BUN 25 (H) 12/02/2020   NA 141 12/02/2020   K 4.2 12/02/2020   CL 107 (H) 12/02/2020   CO2 21 12/02/2020   Lab Results  Component Value Date   ALT 18 12/02/2020   AST 16 12/02/2020   ALKPHOS 71 12/02/2020   BILITOT 0.4 12/02/2020   Lab Results  Component Value Date   HGBA1C 5.2 11/16/2019   Lab Results  Component Value Date   INSULIN 35.9 (H) 11/16/2019   Lab Results  Component Value Date   TSH 2.410 11/16/2019   Lab Results  Component Value Date   CHOL 135 12/02/2020   HDL 34 (L) 12/02/2020   LDLCALC 83 12/02/2020   TRIG 97 12/02/2020   CHOLHDL 4.0 12/02/2020   Lab Results  Component Value Date   WBC 7.0 12/02/2020   HGB 15.0 12/02/2020   HCT 44.4 12/02/2020   MCV 89 12/02/2020   PLT 166 12/02/2020   Attestation Statements:   Reviewed by clinician on day of visit: allergies, medications, problem list, medical history, surgical history, family history, social history, and previous encounter notes.  I, Water quality scientist, CMA, am acting as transcriptionist for Briscoe Deutscher, DO  I have reviewed the above documentation for accuracy and completeness, and I agree with the above. Briscoe Deutscher, DO

## 2021-05-29 ENCOUNTER — Other Ambulatory Visit: Payer: Self-pay | Admitting: Family Medicine

## 2021-05-29 NOTE — Telephone Encounter (Signed)
Last filled 08/19/2020 Last OV 09/25/2020 (acute)  Ok to fill?

## 2021-06-06 ENCOUNTER — Other Ambulatory Visit: Payer: Self-pay

## 2021-06-17 ENCOUNTER — Ambulatory Visit (INDEPENDENT_AMBULATORY_CARE_PROVIDER_SITE_OTHER): Payer: No Typology Code available for payment source | Admitting: Family Medicine

## 2021-07-02 ENCOUNTER — Ambulatory Visit (INDEPENDENT_AMBULATORY_CARE_PROVIDER_SITE_OTHER): Payer: No Typology Code available for payment source | Admitting: Family Medicine

## 2021-07-05 ENCOUNTER — Other Ambulatory Visit: Payer: Self-pay | Admitting: Family Medicine

## 2021-07-11 ENCOUNTER — Encounter: Payer: Self-pay | Admitting: Family Medicine

## 2021-07-15 ENCOUNTER — Other Ambulatory Visit: Payer: Self-pay

## 2021-07-15 MED ORDER — TICAGRELOR 90 MG PO TABS: 90.0000 mg | ORAL_TABLET | Freq: Two times a day (BID) | ORAL | 3 refills | Status: DC

## 2021-07-22 ENCOUNTER — Encounter (INDEPENDENT_AMBULATORY_CARE_PROVIDER_SITE_OTHER): Payer: Self-pay | Admitting: Family Medicine

## 2021-07-22 ENCOUNTER — Ambulatory Visit (INDEPENDENT_AMBULATORY_CARE_PROVIDER_SITE_OTHER): Payer: No Typology Code available for payment source | Admitting: Family Medicine

## 2021-07-22 ENCOUNTER — Other Ambulatory Visit: Payer: Self-pay

## 2021-07-22 VITALS — BP 133/78 | HR 55 | Temp 97.9°F | Ht 69.0 in | Wt 287.0 lb

## 2021-07-22 DIAGNOSIS — F419 Anxiety disorder, unspecified: Secondary | ICD-10-CM

## 2021-07-22 DIAGNOSIS — Z9189 Other specified personal risk factors, not elsewhere classified: Secondary | ICD-10-CM

## 2021-07-22 DIAGNOSIS — F32A Depression, unspecified: Secondary | ICD-10-CM

## 2021-07-22 DIAGNOSIS — R7301 Impaired fasting glucose: Secondary | ICD-10-CM

## 2021-07-22 DIAGNOSIS — Z6841 Body Mass Index (BMI) 40.0 and over, adult: Secondary | ICD-10-CM

## 2021-07-22 DIAGNOSIS — R4587 Impulsiveness: Secondary | ICD-10-CM | POA: Diagnosis not present

## 2021-07-22 MED ORDER — TIRZEPATIDE 2.5 MG/0.5ML ~~LOC~~ SOAJ: 2.5000 mg | SUBCUTANEOUS | 0 refills | Status: DC

## 2021-07-24 ENCOUNTER — Encounter (INDEPENDENT_AMBULATORY_CARE_PROVIDER_SITE_OTHER): Payer: Self-pay

## 2021-07-28 NOTE — Progress Notes (Signed)
Chief Complaint:   OBESITY Hector [name] is here to discuss his progress with his obesity treatment plan along with follow-up of his obesity related diagnoses. See Medical Weight Management Flowsheet for complete bioelectrical impedance results.  Today's visit was #: 25 Starting weight: 282 lbs Starting date: 01/30/2020 Today's weight: 287 lbs Today's date: 07/22/2021 Weight change since last visit: 0 Total lbs lost to date: +5 lbs Body mass index is 42.38 kg/m.   Interim History: Hector [name] says that he feels ready to restart the program. Nutrition Plan: the Category 2 Plan for 25% of the time. Activity:  None at this time.  Assessment/Plan:   1. Impaired fasting glucose Hector [name] will start Mounjaro 2.5 mg subcutaneously weekly.  New prescription sent to pharmacy.  - Start tirzepatide [facility]) 2.5 MG/0.5ML Pen; Inject 2.5 mg into the skin once a week.  Dispense: 2 mL; Refill: 0  2. Impulsive eating, addictive behaviors Hector [name] will be referred to [facility] for evaluation/treatment.  3. Anxiety and depression Hector [name] is taking Prozac 10 mg daily.  Plan:  Will refer to Spero Curb at [place] and [place].  4. At risk for heart disease Due to Hector [name]'s current state of health and medical condition(s), he is at a higher risk for heart disease.  This puts the patient at much greater risk to subsequently develop cardiopulmonary conditions that can significantly affect patient's quality of life in a negative manner.    At least 8 minutes were spent on counseling Hector [name] about these concerns today. Evidence-based interventions for health behavior change were utilized today including the discussion of self monitoring techniques, problem-solving barriers, and SMART goal setting techniques.  Specifically, regarding patient's less desirable eating habits and patterns, we employed the technique of small changes when Hector [name] has not been able to fully commit to his prudent  nutritional plan.  5. Obesity, current BMI 42.4  Course: Hector [name] is currently in the action stage of change. As such, his goal is to continue with weight loss efforts.   Nutrition goals: He has agreed to the Category 2 Plan.   Exercise goals: For substantial health benefits, adults should do at least 150 minutes (2 hours and 30 minutes) a week of moderate-intensity, or 75 minutes (1 hour and 15 minutes) a week of vigorous-intensity aerobic physical activity, or an equivalent combination of moderate- and vigorous-intensity aerobic activity. Aerobic activity should be performed in episodes of at least 10 minutes, and preferably, it should be spread throughout the week.  Behavioral modification strategies: increasing lean protein intake, decreasing simple carbohydrates, increasing vegetables, and increasing water intake.  Hector [name] has agreed to follow-up with our clinic in 4 weeks. He was informed of the importance of frequent follow-up visits to maximize his success with intensive lifestyle modifications for his multiple health conditions.   Objective:   Blood pressure 133/78, pulse (!) 55, temperature 97.9 F (36.6 C), temperature source Oral, height 5' 9"  (1.753 m), weight 287 lb (130.2 kg), SpO2 97 %. Body mass index is 42.38 kg/m.  General: Cooperative, alert, well developed, in no acute distress. HEENT: Conjunctivae and lids unremarkable. Cardiovascular: Regular rhythm.  Lungs: Normal work of breathing. Neurologic: No focal deficits.   Lab Results  Component Value Date   CREATININE 1.16 12/02/2020   BUN 25 (H) 12/02/2020   NA 141 12/02/2020   K 4.2 12/02/2020   CL 107 (H) 12/02/2020   CO2 21 12/02/2020   Lab Results  Component Value Date   ALT 18 12/02/2020  AST 16 12/02/2020   ALKPHOS 71 12/02/2020   BILITOT 0.4 12/02/2020   Lab Results  Component Value Date   HGBA1C 5.2 11/16/2019   Lab Results  Component Value Date   INSULIN 35.9 (H) 11/16/2019   Lab Results   Component Value Date   TSH 2.410 11/16/2019   Lab Results  Component Value Date   CHOL 135 12/02/2020   HDL 34 (L) 12/02/2020   LDLCALC 83 12/02/2020   TRIG 97 12/02/2020   CHOLHDL 4.0 12/02/2020   Lab Results  Component Value Date   VD25OH 17.8 (L) 11/16/2019   Lab Results  Component Value Date   WBC 7.0 12/02/2020   HGB 15.0 12/02/2020   HCT 44.4 12/02/2020   MCV 89 12/02/2020   PLT 166 12/02/2020   Attestation Statements:   Reviewed by clinician on day of visit: allergies, medications, problem list, medical history, surgical history, family history, social history, and previous encounter notes.  I, Water quality scientist, CMA, am acting as transcriptionist for Briscoe Deutscher, DO  I have reviewed the above documentation for accuracy and completeness, and I agree with the above. Briscoe Deutscher, DO

## 2021-08-04 NOTE — Telephone Encounter (Signed)
I am forwarding this to [facility] for further review.

## 2021-08-21 ENCOUNTER — Ambulatory Visit (INDEPENDENT_AMBULATORY_CARE_PROVIDER_SITE_OTHER): Payer: No Typology Code available for payment source | Admitting: Family Medicine

## 2021-09-01 ENCOUNTER — Other Ambulatory Visit: Payer: Self-pay | Admitting: Family Medicine

## 2021-09-03 ENCOUNTER — Ambulatory Visit (INDEPENDENT_AMBULATORY_CARE_PROVIDER_SITE_OTHER): Payer: No Typology Code available for payment source | Admitting: Family Medicine

## 2021-09-03 ENCOUNTER — Other Ambulatory Visit: Payer: Self-pay

## 2021-09-03 ENCOUNTER — Encounter (INDEPENDENT_AMBULATORY_CARE_PROVIDER_SITE_OTHER): Payer: Self-pay | Admitting: Family Medicine

## 2021-09-03 VITALS — BP 133/83 | HR 47 | Temp 98.0°F | Ht 69.0 in | Wt 289.0 lb

## 2021-09-03 DIAGNOSIS — Z9189 Other specified personal risk factors, not elsewhere classified: Secondary | ICD-10-CM | POA: Diagnosis not present

## 2021-09-03 DIAGNOSIS — F329 Major depressive disorder, single episode, unspecified: Secondary | ICD-10-CM | POA: Diagnosis not present

## 2021-09-03 DIAGNOSIS — E8881 Metabolic syndrome: Secondary | ICD-10-CM | POA: Diagnosis not present

## 2021-09-03 DIAGNOSIS — Z6841 Body Mass Index (BMI) 40.0 and over, adult: Secondary | ICD-10-CM

## 2021-09-03 DIAGNOSIS — R5383 Other fatigue: Secondary | ICD-10-CM

## 2021-09-03 DIAGNOSIS — E559 Vitamin D deficiency, unspecified: Secondary | ICD-10-CM

## 2021-09-04 LAB — LIPID PANEL
Chol/HDL Ratio: 3.6 ratio (ref 0.0–5.0)
Cholesterol, Total: 126 mg/dL (ref 100–199)
HDL: 35 mg/dL — ABNORMAL LOW (ref >39)
LDL Chol Calc (NIH): 69 mg/dL (ref 0–99)
Triglycerides: 122 mg/dL (ref 0–149)
VLDL Cholesterol Cal: 22 mg/dL (ref 5–40)

## 2021-09-04 LAB — CBC WITH DIFFERENTIAL/PLATELET
Basophils Absolute: 0.1 10*3/uL (ref 0.0–0.2)
Basos: 1 %
EOS (ABSOLUTE): 0.9 10*3/uL — ABNORMAL HIGH (ref 0.0–0.4)
Eos: 11 %
Hematocrit: 42.8 % (ref 37.5–51.0)
Hemoglobin: 14.6 g/dL (ref 13.0–17.7)
Immature Grans (Abs): 0 10*3/uL (ref 0.0–0.1)
Immature Granulocytes: 0 %
Lymphocytes Absolute: 1.7 10*3/uL (ref 0.7–3.1)
Lymphs: 20 %
MCH: 30.5 pg (ref 26.6–33.0)
MCHC: 34.1 g/dL (ref 31.5–35.7)
MCV: 90 fL (ref 79–97)
Monocytes Absolute: 0.5 10*3/uL (ref 0.1–0.9)
Monocytes: 6 %
Neutrophils Absolute: 5.1 10*3/uL (ref 1.4–7.0)
Neutrophils: 62 %
Platelets: 158 10*3/uL (ref 150–450)
RBC: 4.78 x10E6/uL (ref 4.14–5.80)
RDW: 12.8 % (ref 11.6–15.4)
WBC: 8.3 10*3/uL (ref 3.4–10.8)

## 2021-09-04 LAB — COMPREHENSIVE METABOLIC PANEL
ALT: 18 IU/L (ref 0–44)
AST: 19 IU/L (ref 0–40)
Albumin/Globulin Ratio: 1.7 (ref 1.2–2.2)
Albumin: 4.2 g/dL (ref 4.0–5.0)
Alkaline Phosphatase: 96 IU/L (ref 44–121)
BUN/Creatinine Ratio: 18 (ref 9–20)
BUN: 19 mg/dL (ref 6–24)
Bilirubin Total: 0.4 mg/dL (ref 0.0–1.2)
CO2: 23 mmol/L (ref 20–29)
Calcium: 8.9 mg/dL (ref 8.7–10.2)
Chloride: 106 mmol/L (ref 96–106)
Creatinine, Ser: 1.07 mg/dL (ref 0.76–1.27)
Globulin, Total: 2.5 g/dL (ref 1.5–4.5)
Glucose: 101 mg/dL — ABNORMAL HIGH (ref 65–99)
Potassium: 4.1 mmol/L (ref 3.5–5.2)
Sodium: 143 mmol/L (ref 134–144)
Total Protein: 6.7 g/dL (ref 6.0–8.5)
eGFR: 85 mL/min/{1.73_m2} (ref >59)

## 2021-09-04 LAB — T4, FREE: Free T4: 1.04 ng/dL (ref 0.82–1.77)

## 2021-09-04 LAB — HEMOGLOBIN A1C
Est. average glucose Bld gHb Est-mCnc: 105 mg/dL
Hgb A1c MFr Bld: 5.3 % (ref 4.8–5.6)

## 2021-09-04 LAB — VITAMIN D 25 HYDROXY (VIT D DEFICIENCY, FRACTURES): Vit D, 25-Hydroxy: 20.3 ng/mL — ABNORMAL LOW (ref 30.0–100.0)

## 2021-09-04 LAB — TSH: TSH: 3.21 u[IU]/mL (ref 0.450–4.500)

## 2021-09-04 LAB — INSULIN, RANDOM: INSULIN: 50.7 u[IU]/mL — ABNORMAL HIGH (ref 2.6–24.9)

## 2021-09-04 LAB — VITAMIN B12: Vitamin B-12: 454 pg/mL (ref 232–1245)

## 2021-09-04 NOTE — Progress Notes (Signed)
Chief Complaint:   OBESITY Hector [name] is here to discuss his progress with his obesity treatment plan along with follow-up of his obesity related diagnoses.   Today's visit was #: 94 Starting weight: 282 lbs Starting date: 01/30/2020 Today's weight: 289 lbs Today's date: 09/03/2021 Weight change since last visit: +2 lbs Total lbs lost to date: +7 lbs Body mass index is 42.68 kg/m.   Current Meal Plan: the Category 2 Plan for 30% of the time.  Current Exercise Plan: None. Current Anti-Obesity Medications:  Mounjaro 2.5 mg subcutaneously weekly.  Side effects:  Stomach pain for 2 weeks.  Interim History:  Hector [name] says he had stomach upset after his first dose of Mounjaro.  He may go low FODMAP again.  He says that [facility] called yesterday.  He has the diagnosis of major depression/food addiction.  He will call them back today with therapist recommendation and appointment.  Assessment/Plan:   1. Metabolic syndrome Starting goal: Lose 7-10% of starting weight. He will continue to focus on protein-rich, low simple carbohydrate foods. We reviewed the importance of hydration, regular exercise for stress reduction, and restorative sleep.  We will continue to check lab work every 3 months, with 10% weight loss, or should any other concerns arise.  - Comprehensive metabolic panel - Hemoglobin A1c - Insulin, random - Lipid panel  2. Other fatigue Will check CBC, B12 level, free T4, and TSH today.  - CBC with Differential/Platelet - Vitamin B12 - T4, free - TSH  3. Vitamin D deficiency Not at goal.   Plan: Check vitamin D level today.  Lab Results  Component Value Date   VD25OH 20.3 (L) 09/03/2021   VD25OH 17.8 (L) 11/16/2019   - VITAMIN D 25 Hydroxy (Vit-D Deficiency, Fractures)  4. Major depressive disorder, remission status unspecified, unspecified whether recurrent Will place referral to Psychiatry today.  - Ambulatory referral to Psychiatry  5. At risk for heart  disease Due to Hector [name]'s current state of health and medical condition(s), he is at a higher risk for heart disease.  This puts the patient at much greater risk to subsequently develop cardiopulmonary conditions that can significantly affect patient's quality of life in a negative manner.    At least 8 minutes were spent on counseling Hector [name] about these concerns today. Evidence-based interventions for health behavior change were utilized today including the discussion of self monitoring techniques, problem-solving barriers, and SMART goal setting techniques.  Specifically, regarding patient's less desirable eating habits and patterns, we employed the technique of small changes when Hector [name] has not been able to fully commit to his prudent nutritional plan.  6. Obesity, current BMI 42.7  Course: Hector [name] is currently in the action stage of change. As such, his goal is to continue with weight loss efforts.   Nutrition goals: He has agreed to practicing portion control and making smarter food choices, such as increasing vegetables and decreasing simple carbohydrates.   Exercise goals: No exercise has been prescribed at this time.  Behavioral modification strategies: increasing lean protein intake, decreasing simple carbohydrates, increasing vegetables, increasing water intake, and emotional eating strategies.  Hector [name] has agreed to follow-up with our clinic in 6 weeks. He was informed of the importance of frequent follow-up visits to maximize his success with intensive lifestyle modifications for his multiple health conditions.   Objective:   Blood pressure 133/83, pulse (!) 47, temperature 98 F (36.7 C), temperature source Oral, height 5' 9"  (1.753 m), weight 289 lb (131.1 kg), SpO2 97 %.  Body mass index is 42.68 kg/m.  General: Cooperative, alert, well developed, in no acute distress. HEENT: Conjunctivae and lids unremarkable. Cardiovascular: Regular rhythm.  Lungs: Normal work of breathing. Neurologic:  No focal deficits.   Lab Results  Component Value Date   CREATININE 1.07 09/03/2021   BUN 19 09/03/2021   NA 143 09/03/2021   K 4.1 09/03/2021   CL 106 09/03/2021   CO2 23 09/03/2021   Lab Results  Component Value Date   ALT 18 09/03/2021   AST 19 09/03/2021   ALKPHOS 96 09/03/2021   BILITOT 0.4 09/03/2021   Lab Results  Component Value Date   HGBA1C 5.3 09/03/2021   HGBA1C 5.2 11/16/2019   Lab Results  Component Value Date   INSULIN WILL FOLLOW 09/03/2021   INSULIN 35.9 (H) 11/16/2019   Lab Results  Component Value Date   TSH 3.210 09/03/2021   Lab Results  Component Value Date   CHOL 126 09/03/2021   HDL 35 (L) 09/03/2021   LDLCALC 69 09/03/2021   TRIG 122 09/03/2021   CHOLHDL 3.6 09/03/2021   Lab Results  Component Value Date   VD25OH 20.3 (L) 09/03/2021   VD25OH 17.8 (L) 11/16/2019   Lab Results  Component Value Date   WBC 8.3 09/03/2021   HGB 14.6 09/03/2021   HCT 42.8 09/03/2021   MCV 90 09/03/2021   PLT 158 09/03/2021   Attestation Statements:   Reviewed by clinician on day of visit: allergies, medications, problem list, medical history, surgical history, family history, social history, and previous encounter notes.  I, Water quality scientist, CMA, am acting as transcriptionist for Briscoe Deutscher, DO  I have reviewed the above documentation for accuracy and completeness, and I agree with the above. Briscoe Deutscher, DO

## 2021-10-01 ENCOUNTER — Ambulatory Visit (INDEPENDENT_AMBULATORY_CARE_PROVIDER_SITE_OTHER): Payer: No Typology Code available for payment source | Admitting: Adult Health

## 2021-10-05 ENCOUNTER — Other Ambulatory Visit: Payer: Self-pay | Admitting: Family Medicine

## 2021-10-06 ENCOUNTER — Other Ambulatory Visit: Payer: Self-pay | Admitting: Family Medicine

## 2021-10-07 ENCOUNTER — Other Ambulatory Visit: Payer: Self-pay

## 2021-10-07 ENCOUNTER — Ambulatory Visit (INDEPENDENT_AMBULATORY_CARE_PROVIDER_SITE_OTHER): Payer: No Typology Code available for payment source | Admitting: Family Medicine

## 2021-10-07 VITALS — BP 150/90 | HR 56 | Temp 97.8°F | Ht 69.0 in | Wt 293.9 lb

## 2021-10-07 DIAGNOSIS — Z0000 Encounter for general adult medical examination without abnormal findings: Secondary | ICD-10-CM

## 2021-10-07 MED ORDER — LORAZEPAM 1 MG PO TABS: 1.0000 mg | ORAL_TABLET | Freq: Three times a day (TID) | ORAL | 0 refills | Status: DC | PRN

## 2021-10-07 MED ORDER — NITROGLYCERIN 0.4 MG SL SUBL: 0.4000 mg | SUBLINGUAL_TABLET | SUBLINGUAL | 1 refills | Status: DC | PRN

## 2021-10-07 MED ORDER — LOSARTAN POTASSIUM 50 MG PO TABS: 50.0000 mg | ORAL_TABLET | Freq: Every day | ORAL | 0 refills | Status: DC

## 2021-10-07 NOTE — Progress Notes (Signed)
Established Patient Office Visit  Subjective:  Patient ID: Hector [name], male    DOB: Jul 08, 1971  Age: 50 y.o. MRN: [id number]  CC:  Chief Complaint  Patient presents with   Annual Exam    HPI Hector [name] presents for physical exam.  Hector [name] has history of obesity, hypertension, CAD, metabolic syndrome, hyperlipidemia.  Hector [name] is followed by weight loss management clinic but poor compliance with diet.  Hector [name] states Hector [name] has only been compliant with diet about 30% of the time.  Minimal weight loss thus far.  Had stent in his heart last year.  No recent chest pains.  No significant dyspnea.  Not exercising consistently.  Blood pressure up slightly today but did not take medications this morning.  Health maintenance reviewed  -Had colonoscopy around 2005 and would like to get follow-up but is on Brilinta -Turns 50 and a week.  No history of shingles vaccine. -Tetanus up-to-date -No history of hepatitis C screening but low risk.  Social history and family history reviewed with no significant changes  Past Medical History:  Diagnosis Date   Alcohol abuse    Allergy    Anal fissure    Anxiety    Asthma    Back pain    Bilateral swelling of feet    Blood in stool    Chest pain    Coronary artery disease    Depression    Drug use    Febrile seizure ([place])    GERD (gastroesophageal reflux disease)    Heartburn    Hyperlipidemia    Hypertension    Irritable bowel syndrome with diarrhea    Jaundice, newborn    Palpitations    Panic    Panic disorder    Pre-diabetes    Seizures ([facility])    febrile   Shortness of breath    Sleep apnea     Past Surgical History:  Procedure Laterality Date   CARDIAC CATHETERIZATION     COLONOSCOPY  2005   CORONARY STENT INTERVENTION N/A 08/19/2020   Procedure: CORONARY STENT INTERVENTION;  Surgeon: Jettie Booze, MD;  Location: [place] [facility];  Service: Cardiovascular;  Laterality: N/A;   HEMORRHOID BANDING     INTRAVASCULAR ULTRASOUND/IVUS  N/A 08/19/2020   Procedure: Intravascular Ultrasound/IVUS;  Surgeon: Jettie Booze, MD;  Location: [place] [facility];  Service: Cardiovascular;  Laterality: N/A;   LEFT HEART CATH AND CORONARY ANGIOGRAPHY N/A 08/19/2020   Procedure: LEFT HEART CATH AND CORONARY ANGIOGRAPHY;  Surgeon: Jettie Booze, MD;  Location: [place] [facility];  Service: Cardiovascular;  Laterality: N/A;   SIGMOIDOSCOPY     WISDOM TOOTH EXTRACTION  1991    Family History  Problem Relation Age of Onset   Dementia Mother    Stroke Mother    Hypertension Mother    Skin cancer Mother    Basal cell carcinoma Mother    Squamous cell carcinoma Mother    Cancer Mother    Depression Mother    Diabetes Father    Hyperlipidemia Father    Stroke Father    Heart disease Father 60   Colon polyps Father    Heart attack Father    Hypertension Father    Depression Father    Sleep apnea Father    Obesity Father    Colitis Brother    Colon polyps Brother     Social History   Socioeconomic History   Marital status: Divorced    Spouse name: Not  on file   Number of children: 1   Years of education: Not on file   Highest education level: Bachelor's degree (e.g., BA, AB, BS)  Occupational History   Occupation: Herbalist  Tobacco Use   Smoking status: Never   Smokeless tobacco: Never  Vaping Use   Vaping Use: Former  Substance and Sexual Activity   Alcohol use: Yes    Alcohol/week: 0.0 standard drinks    Comment: 1-5 per week   Drug use: Yes    Types: Marijuana    Comment: Reports sometimes Hector [name] smokes daily sometimes weekly   Sexual activity: Yes  Other Topics Concern   Not on file  Social History Narrative   Patient is right-handed. Hector [name] lives in a 2 story home. Hector [name] drinks 2 20 oz sodas a day. Hector [name] does not exercise.   Social Determinants of Health   Financial Resource Strain: Not on file  Food Insecurity: Not on file  Transportation Needs: Not on file  Physical Activity: Not on file   Stress: Not on file  Social Connections: Not on file  Intimate Partner Violence: Not on file    Outpatient Medications Prior to Visit  Medication Sig Dispense Refill   aspirin EC 81 MG tablet Take 81 mg by mouth daily. Swallow whole.     atorvastatin (LIPITOR) 80 MG tablet Take 1 tablet (80 mg total) by mouth daily. (Patient taking differently: Take 80 mg by mouth daily. 1/2 tablet daily) 90 tablet 3   FLUoxetine (PROZAC) 10 MG capsule TAKE 1 CAPSULE DAILY 90 capsule 0   nebivolol (BYSTOLIC) 10 MG tablet TAKE 1 TABLET ONCE DAILY 90 tablet 1   tadalafil (CIALIS) 10 MG tablet Take 10 mg by mouth daily as needed for erectile dysfunction.     ticagrelor (BRILINTA) 90 MG TABS tablet Take 1 tablet (90 mg total) by mouth 2 (two) times daily. 180 tablet 3   LORazepam (ATIVAN) 1 MG tablet TAKE 1 TABLET BY MOUTH EVERY 8 HOURS AS NEEDED FOR ANXIETY 30 tablet 0   losartan (COZAAR) 50 MG tablet TAKE 1 TABLET (50 MG TOTAL) BY MOUTH DAILY. *APPOINTMENT REQUIRED FOR ANY FUTURE REFILLS 30 tablet 0   No facility-administered medications prior to visit.    Allergies  Allergen Reactions   Other Diarrhea    GLP-1RA    ROS Review of Systems  Constitutional:  Negative for activity change, appetite change, fatigue and fever.  HENT:  Negative for congestion, ear pain and trouble swallowing.   Eyes:  Negative for pain and visual disturbance.  Respiratory:  Negative for cough, shortness of breath and wheezing.   Cardiovascular:  Negative for chest pain and palpitations.  Gastrointestinal:  Negative for abdominal distention, abdominal pain, blood in stool, constipation, diarrhea, nausea, rectal pain and vomiting.  Genitourinary:  Negative for dysuria, hematuria and testicular pain.  Musculoskeletal:  Negative for arthralgias and joint swelling.  Skin:  Negative for rash.  Neurological:  Negative for dizziness, syncope and headaches.  Hematological:  Negative for adenopathy.  Psychiatric/Behavioral:   Negative for confusion and dysphoric mood.      Objective:    Physical Exam Constitutional:      General: Hector [name] is not in acute distress.    Appearance: Hector [name] is well-developed.  HENT:     Head: Normocephalic and atraumatic.     Right Ear: External ear normal.     Left Ear: External ear normal.  Eyes:     Conjunctiva/sclera: Conjunctivae normal.  Pupils: Pupils are equal, round, and reactive to light.  Neck:     Thyroid: No thyromegaly.  Cardiovascular:     Rate and Rhythm: Normal rate and regular rhythm.     Heart sounds: Normal heart sounds. No murmur heard. Pulmonary:     Effort: No respiratory distress.     Breath sounds: No wheezing or rales.  Abdominal:     General: Bowel sounds are normal. There is no distension.     Palpations: Abdomen is soft. There is no mass.     Tenderness: There is no abdominal tenderness. There is no guarding or rebound.  Musculoskeletal:     Cervical back: Normal range of motion and neck supple.     Right lower leg: No edema.     Left lower leg: No edema.  Lymphadenopathy:     Cervical: No cervical adenopathy.  Skin:    Findings: No rash.  Neurological:     Mental Status: Hector [name] is alert and oriented to person, place, and time.     Cranial Nerves: No cranial nerve deficit.     Deep Tendon Reflexes: Reflexes normal.    BP (!) 150/90 (BP Location: Left Arm, Patient Position: Sitting, Cuff Size: Normal)   Pulse (!) 56   Temp 97.8 F (36.6 C) (Oral)   Ht 5' 9"  (1.753 m)   Wt 293 lb 14.4 oz (133.3 kg)   SpO2 98%   BMI 43.40 kg/m  Wt Readings from Last 3 Encounters:  10/07/21 293 lb 14.4 oz (133.3 kg)  09/03/21 289 lb (131.1 kg)  07/22/21 287 lb (130.2 kg)     Health Maintenance Due  Topic Date Due   HIV Screening  Never done   Hepatitis C Screening  Never done   COLONOSCOPY (Pts 45-48yr Insurance coverage will need to be confirmed)  10/13/2016    There are no preventive care reminders to display for this patient.  Lab Results   Component Value Date   TSH 3.210 09/03/2021   Lab Results  Component Value Date   WBC 8.3 09/03/2021   HGB 14.6 09/03/2021   HCT 42.8 09/03/2021   MCV 90 09/03/2021   PLT 158 09/03/2021   Lab Results  Component Value Date   NA 143 09/03/2021   K 4.1 09/03/2021   CO2 23 09/03/2021   GLUCOSE 101 (H) 09/03/2021   BUN 19 09/03/2021   CREATININE 1.07 09/03/2021   BILITOT 0.4 09/03/2021   ALKPHOS 96 09/03/2021   AST 19 09/03/2021   ALT 18 09/03/2021   PROT 6.7 09/03/2021   ALBUMIN 4.2 09/03/2021   CALCIUM 8.9 09/03/2021   ANIONGAP 13 08/07/2020   EGFR 85 09/03/2021   GFR 100.42 06/12/2019   Lab Results  Component Value Date   CHOL 126 09/03/2021   Lab Results  Component Value Date   HDL 35 (L) 09/03/2021   Lab Results  Component Value Date   LDLCALC 69 09/03/2021   Lab Results  Component Value Date   TRIG 122 09/03/2021   Lab Results  Component Value Date   CHOLHDL 3.6 09/03/2021   Lab Results  Component Value Date   HGBA1C 5.3 09/03/2021      Assessment & Plan:   Physical exam.  Patient has multiple chronic problems including obesity, CAD, hyperlipidemia, hypertension, metabolic syndrome.  -We recommend flu vaccine and Hector [name] thinks Hector [name] may have had this somewhere else.  Hector [name] would like to confirm before getting today. -Consider Shingrix vaccine after turning 50.  Hector [name]  will check on insurance coverage -Consider repeat colonoscopy at some point this year.  Hector [name] will check with cardiology first -Consider PSA at some point next year -Multiple recent labs which were done through weight loss clinic were reviewed with patient -We strongly advocate Hector [name] try to step up compliance and lose some weight  -Refills given for his as needed nitroglycerin, losartan, and lorazepam- which Hector [name] takes infrequently  Meds ordered this encounter  Medications   nitroGLYCERIN (NITROSTAT) 0.4 MG SL tablet    Sig: Place 1 tablet (0.4 mg total) under the tongue every 5 (five) minutes as  needed for chest pain.    Dispense:  20 tablet    Refill:  1   LORazepam (ATIVAN) 1 MG tablet    Sig: Take 1 tablet (1 mg total) by mouth every 8 (eight) hours as needed. for anxiety    Dispense:  30 tablet    Refill:  0    Not to exceed 4 additional fills before 05/31/2021   losartan (COZAAR) 50 MG tablet    Sig: Take 1 tablet (50 mg total) by mouth daily. *appointment required for any future refills    Dispense:  10 tablet    Refill:  0    Follow-up: No follow-ups on file.    Carolann Littler, MD

## 2021-10-07 NOTE — Patient Instructions (Signed)
Consider shingles vaccine (Shingrix) and let us know if interested  Consider PSA after 50  Consider Colonoscopy at some point in next year- check with cardiology.   Lose some weight- as discussed

## 2021-10-21 ENCOUNTER — Other Ambulatory Visit: Payer: Self-pay | Admitting: Family Medicine

## 2021-10-29 ENCOUNTER — Other Ambulatory Visit: Payer: Self-pay

## 2021-10-29 ENCOUNTER — Other Ambulatory Visit: Payer: Self-pay | Admitting: Family Medicine

## 2021-10-29 ENCOUNTER — Encounter (INDEPENDENT_AMBULATORY_CARE_PROVIDER_SITE_OTHER): Payer: Self-pay | Admitting: Family Medicine

## 2021-10-29 ENCOUNTER — Ambulatory Visit (INDEPENDENT_AMBULATORY_CARE_PROVIDER_SITE_OTHER): Payer: No Typology Code available for payment source | Admitting: Family Medicine

## 2021-10-29 VITALS — BP 137/83 | HR 49 | Temp 97.5°F | Ht 69.0 in | Wt 290.0 lb

## 2021-10-29 DIAGNOSIS — E559 Vitamin D deficiency, unspecified: Secondary | ICD-10-CM | POA: Diagnosis not present

## 2021-10-29 DIAGNOSIS — Z6841 Body Mass Index (BMI) 40.0 and over, adult: Secondary | ICD-10-CM | POA: Diagnosis not present

## 2021-10-29 DIAGNOSIS — F332 Major depressive disorder, recurrent severe without psychotic features: Secondary | ICD-10-CM

## 2021-10-29 MED ORDER — VITAMIN D (ERGOCALCIFEROL) 1.25 MG (50000 UNIT) PO CAPS
50000.0000 [IU] | ORAL_CAPSULE | ORAL | 0 refills | Status: DC
Start: 2021-10-29 — End: 2022-08-14

## 2021-10-29 NOTE — Progress Notes (Signed)
Chief Complaint:   OBESITY Hector [name] is here to discuss his progress with his obesity treatment plan along with follow-up of his obesity related diagnoses. See Medical Weight Management Flowsheet for complete bioelectrical impedance results.  Today's visit was #: 70 Starting weight: 282 lbs Starting date: 01/30/2020 Weight change since last visit: +1 lb Total lbs lost to date: +8 lbs  Nutrition Plan: Category 2 Meal Plan for 25% of the time.  Activity: None.  Interim History: Hector [name] is seeing a therapist - Corliss Marcus.  He has had 3 or 4 visits.  He says he feels depressed.  He says, "the problem is me".  Assessment/Plan:   1. Vitamin D deficiency Not at goal.  He is taking vitamin D 50,000 IU weekly.  Plan: Continue to take prescription Vitamin D @50 ,000 IU every week as prescribed.  Follow-up for routine testing of Vitamin D, at least 2-3 times per year to avoid over-replacement.  Lab Results  Component Value Date   VD25OH 20.3 (L) 09/03/2021   VD25OH 17.8 (L) 11/16/2019   - Refill Vitamin D, Ergocalciferol, (DRISDOL) 1.25 MG (50000 UNIT) CAPS capsule; Take 1 capsule (50,000 Units total) by mouth every 7 (seven) days.  Dispense: 12 capsule; Refill: 0  2. Severe episode of recurrent major depressive disorder, without psychotic features ([place]) Hector [name] has been going to therapy and says he feels depressed.  He is taking Prozac 10 mg daily. No SI, HI. Behavior modification techniques were discussed today to help deal with emotional/non-hunger eating behaviors.   Plan:  Will place referral to Psychiatry.   - Ambulatory referral to Psychiatry  3. Obesity, current BMI 42.9  Course: Hector [name] is currently in the action stage of change. As such, his goal is to continue with weight loss efforts.   Nutrition goals: He has agreed to practicing portion control and making smarter food choices, such as increasing vegetables and decreasing simple carbohydrates.   Exercise goals: No exercise  has been prescribed at this time.  Behavioral modification strategies: emotional eating strategies.  Hector [name] has agreed to follow-up with our clinic as needed. He was informed of the importance of frequent follow-up visits to maximize his success with intensive lifestyle modifications for his multiple health conditions.   Objective:   Blood pressure 137/83, pulse (!) 49, temperature (!) 97.5 F (36.4 C), temperature source Oral, height 5' 9"  (1.753 m), weight 290 lb (131.5 kg), SpO2 98 %. Body mass index is 42.83 kg/m.  General: Cooperative, alert, well developed, in no acute distress. HEENT: Conjunctivae and lids unremarkable. Cardiovascular: Regular rhythm.  Lungs: Normal work of breathing. Neurologic: No focal deficits.   Lab Results  Component Value Date   CREATININE 1.07 09/03/2021   BUN 19 09/03/2021   NA 143 09/03/2021   K 4.1 09/03/2021   CL 106 09/03/2021   CO2 23 09/03/2021   Lab Results  Component Value Date   ALT 18 09/03/2021   AST 19 09/03/2021   ALKPHOS 96 09/03/2021   BILITOT 0.4 09/03/2021   Lab Results  Component Value Date   HGBA1C 5.3 09/03/2021   HGBA1C 5.2 11/16/2019   Lab Results  Component Value Date   INSULIN 50.7 (H) 09/03/2021   INSULIN 35.9 (H) 11/16/2019   Lab Results  Component Value Date   TSH 3.210 09/03/2021   Lab Results  Component Value Date   CHOL 126 09/03/2021   HDL 35 (L) 09/03/2021   LDLCALC 69 09/03/2021   TRIG 122 09/03/2021   CHOLHDL  3.6 09/03/2021   Lab Results  Component Value Date   VD25OH 20.3 (L) 09/03/2021   VD25OH 17.8 (L) 11/16/2019   Lab Results  Component Value Date   WBC 8.3 09/03/2021   HGB 14.6 09/03/2021   HCT 42.8 09/03/2021   MCV 90 09/03/2021   PLT 158 09/03/2021   Attestation Statements:   Reviewed by clinician on day of visit: allergies, medications, problem list, medical history, surgical history, family history, social history, and previous encounter notes.  Time spent on visit  including pre-visit chart review and post-visit care and documentation was 42 minutes. Time was spent on: preparing to see the patient (eg, review of tests), obtaining and/or reviewing separately obtained history, performing a medically necessary appropriate examination and/or evaluation, counseling and educating the patient/family/caregiver, and documenting clinical information in the electronic or other health.   I, Water quality scientist, CMA, am acting as transcriptionist for Briscoe Deutscher, DO  I have reviewed the above documentation for accuracy and completeness, and I agree with the above. -  Briscoe Deutscher, DO, MS, FAAFP, DABOM - Family and Bariatric Medicine.

## 2021-11-09 NOTE — Progress Notes (Signed)
Cardiology Office Note   Date:  11/10/2021   ID:  Hector [name], DOB 1971-10-27, MRN [id number]  PCP:  Hector Post, MD    No chief complaint on file.  CAD  Wt Readings from Last 3 Encounters:  11/10/21 298 lb 12.8 oz (135.5 kg)  10/29/21 290 lb (131.5 kg)  10/07/21 293 lb 14.4 oz (133.3 kg)       History of Present Illness: Hector [name] is a 49 y.o. male   who I saw in August 2021.  He was having symptoms c/w unstable angina and cardiac catheterization was arranged.   Cardiac cath in August 2021 showed: "Mid LAD lesion is 95% stenosed. Right to left collaterals. A drug-eluting stent was successfully placed using a STENT RESOLUTE ONYX 3.0X30, postdilated to > 4 mm in the mid and proximal stent and optimized with IVUS. [name] intervention, there is a 0% residual stenosis. Prox RCA lesion is 30% stenosed. Minimal circumflex disease. The left ventricular systolic function is normal. LV end diastolic pressure is normal. The left ventricular ejection fraction is 55-65% by visual estimate. There is no aortic valve stenosis. Prox LAD lesion is 20% stenosed.   Severe single vessel CAD.    Due to his symptoms of unstable angina as an outpatient and the severity of the lesion, I elected to use Brilinta.  This could be changed to clopidogrel after 30 days if there are issues with Brilinta.   We will need to restart high-dose statin.  We spoke about lifestyle changes as well including avoiding smoking, healthy diet and regular exercise.  Cardiac rehab will be beneficial."   At follow-up in September 2021, he was doing well.  He noted bruising.  He was supposed to start semaglutide to help with weight loss.   Bruising improved.   He thought he had some side effects from atorvastatin, 16 days of diarrhea.  He decreased atorvastatin but then feels he can go back to 80 mg.   He changed his diet and diarrhea got better.    He has had his COVID booster.   Denies : exertional  Chest pain. Dizziness. Leg edema. Nitroglycerin use. Orthopnea. Palpitations. Paroxysmal nocturnal dyspnea. Shortness of breath. Syncope.    No sx like prior angina.   Not walking regularly.  Not eating healthy much of the time.      Past Medical History:  Diagnosis Date   Alcohol abuse    Allergy    Anal fissure    Anxiety    Asthma    Back pain    Bilateral swelling of feet    Blood in stool    Chest pain    Coronary artery disease    Depression    Drug use    Febrile seizure ([place])    GERD (gastroesophageal reflux disease)    Heartburn    Hyperlipidemia    Hypertension    Irritable bowel syndrome with diarrhea    Jaundice, newborn    Palpitations    Panic    Panic disorder    Pre-diabetes    Seizures (HCC)    febrile   Shortness of breath    Sleep apnea     Past Surgical History:  Procedure Laterality Date   CARDIAC CATHETERIZATION     COLONOSCOPY  2005   CORONARY STENT INTERVENTION N/A 08/19/2020   Procedure: CORONARY STENT INTERVENTION;  Surgeon: Hector Booze, MD;  Location: [facility];  Service: Cardiovascular;  Laterality: N/A;  HEMORRHOID BANDING     INTRAVASCULAR ULTRASOUND/IVUS N/A 08/19/2020   Procedure: Intravascular Ultrasound/IVUS;  Surgeon: Hector Booze, MD;  Location: [facility];  Service: Cardiovascular;  Laterality: N/A;   LEFT HEART CATH AND CORONARY ANGIOGRAPHY N/A 08/19/2020   Procedure: LEFT HEART CATH AND CORONARY ANGIOGRAPHY;  Surgeon: Hector Booze, MD;  Location: [facility];  Service: Cardiovascular;  Laterality: N/A;   SIGMOIDOSCOPY     WISDOM TOOTH EXTRACTION  1991     Current Outpatient Medications  Medication Sig Dispense Refill   aspirin EC 81 MG tablet Take 81 mg by mouth daily. Swallow whole.     atorvastatin (LIPITOR) 80 MG tablet Take 1 tablet (80 mg total) by mouth daily. (Patient taking differently: Take 80 mg by mouth daily. 1/2 tablet daily) 90 tablet 3   FLUoxetine (PROZAC)  10 MG capsule TAKE 1 CAPSULE DAILY 90 capsule 0   LORazepam (ATIVAN) 1 MG tablet Take 1 tablet (1 mg total) by mouth every 8 (eight) hours as needed. for anxiety 30 tablet 0   losartan (COZAAR) 50 MG tablet Take 1 tablet (50 mg total) by mouth daily. 30 tablet 2   nebivolol (BYSTOLIC) 10 MG tablet TAKE 1 TABLET ONCE DAILY 90 tablet 1   nitroGLYCERIN (NITROSTAT) 0.4 MG SL tablet Place 1 tablet (0.4 mg total) under the tongue every 5 (five) minutes as needed for chest pain. 20 tablet 1   tadalafil (CIALIS) 10 MG tablet Take 10 mg by mouth daily as needed for erectile dysfunction.     ticagrelor (BRILINTA) 90 MG TABS tablet Take 1 tablet (90 mg total) by mouth 2 (two) times daily. 180 tablet 3   Vitamin D, Ergocalciferol, (DRISDOL) 1.25 MG (50000 UNIT) CAPS capsule Take 1 capsule (50,000 Units total) by mouth every 7 (seven) days. 12 capsule 0   No current facility-administered medications for this visit.    Allergies:   Other    Social History:  The patient  reports that he has never smoked. He has never used smokeless tobacco. He reports current alcohol use. He reports current drug use. Drug: Marijuana.   Family History:  The patient's family history includes Basal cell carcinoma in his mother; Cancer in his mother; Colitis in his brother; Colon polyps in his brother and father; Dementia in his mother; Depression in his father and mother; Diabetes in his father; Heart attack in his father; Heart disease (age of onset: 28) in his father; Hyperlipidemia in his father; Hypertension in his father and mother; Obesity in his father; Skin cancer in his mother; Sleep apnea in his father; Squamous cell carcinoma in his mother; Stroke in his father and mother.    ROS:  Please see the history of present illness.   Otherwise, review of systems are positive for weight gain- works from home.   All other systems are reviewed and negative.    PHYSICAL EXAM: VS:  BP 135/84   Pulse (!) 56   Ht 5' 9"  (1.753 m)    Wt 298 lb 12.8 oz (135.5 kg)   SpO2 96%   BMI 44.13 kg/m  , BMI Body mass index is 44.13 kg/m. GEN: Well nourished, well developed, in no acute distress HEENT: normal Neck: no JVD, carotid bruits, or masses Cardiac: RRR; no murmurs, rubs, or gallops,no edema  Respiratory:  clear to auscultation bilaterally, normal work of breathing GI: soft, nontender, nondistended, + BS MS: no deformity or atrophy Skin: warm and dry, no rash Neuro:  Strength and sensation are intact Psych: euthymic mood, full affect   EKG:   The ekg ordered today demonstrates NSR, no ST changes   Recent Labs: 09/03/2021: ALT 18; BUN 19; Creatinine, Ser 1.07; Hemoglobin 14.6; Platelets 158; Potassium 4.1; Sodium 143; TSH 3.210   Lipid Panel    Component Value Date/Time   CHOL 126 09/03/2021 1022   TRIG 122 09/03/2021 1022   HDL 35 (L) 09/03/2021 1022   CHOLHDL 3.6 09/03/2021 1022   CHOLHDL 3 06/12/2019 1059   VLDL 12.2 06/12/2019 1059   LDLCALC 69 09/03/2021 1022     Other studies Reviewed: Additional studies/ records that were reviewed today with results demonstrating: labs reviewed.    ASSESSMENT AND PLAN:  CAD: s/p PCI.  No angina.  Continue aggressive secondary prevention.  [employer] supply.  Will change to clopidogrel 75 mg daily.  OK to hold antiplatelet therapy for 5 day before colonoscopy.  If he is doing well after few months on dual antiplatelet therapy with clopidogrel, could consider stopping aspirin as well.  He will let us know if he still has significant bruising after he switches to clopidogrel.  Given his young age, I do think it is wise for him to stay on antiplatelet therapy going forward.  I did explain that it could be paused temporarily for procedures such as colonoscopy. HTN: The current medical regimen is effective;  continue present plan and medications.  We spoke about improving diet and increasing exercise to help manage risk factors. Hyperlipidemia:  High dose statin.   LDL 69.  Continue atorvastatin.  Morbid obesity: Whole food, plant based diet.  Increase fiber intake.  Avoid processed foods.  Stressed importance of weight loss.  LE edema: Elevate legs, compression stockings, and diuretics.  Low salt Diet   Current medicines are reviewed at length with the patient today.  The patient concerns regarding his medicines were addressed.  The following changes have been made:  No change  Labs/ tests ordered today include:  No orders of the defined types were placed in this encounter.   Recommend 150 minutes/week of aerobic exercise Low fat, low carb, high fiber diet recommended  Disposition:   FU in 1 year   Signed, Larae Grooms, MD  11/10/2021 11:52 AM    [facility] [address], [place], [place]  [address] Phone: [phone]; Fax: [phone]

## 2021-11-10 ENCOUNTER — Other Ambulatory Visit: Payer: Self-pay

## 2021-11-10 ENCOUNTER — Encounter: Payer: Self-pay | Admitting: Interventional Cardiology

## 2021-11-10 ENCOUNTER — Ambulatory Visit (INDEPENDENT_AMBULATORY_CARE_PROVIDER_SITE_OTHER): Payer: No Typology Code available for payment source | Admitting: Interventional Cardiology

## 2021-11-10 VITALS — BP 135/84 | HR 56 | Ht 69.0 in | Wt 298.8 lb

## 2021-11-10 DIAGNOSIS — E782 Mixed hyperlipidemia: Secondary | ICD-10-CM | POA: Diagnosis not present

## 2021-11-10 DIAGNOSIS — Z8249 Family history of ischemic heart disease and other diseases of the circulatory system: Secondary | ICD-10-CM

## 2021-11-10 DIAGNOSIS — I2510 Atherosclerotic heart disease of native coronary artery without angina pectoris: Secondary | ICD-10-CM | POA: Diagnosis not present

## 2021-11-10 DIAGNOSIS — I10 Essential (primary) hypertension: Secondary | ICD-10-CM | POA: Diagnosis not present

## 2021-11-10 DIAGNOSIS — Z95820 Peripheral vascular angioplasty status with implants and grafts: Secondary | ICD-10-CM

## 2021-11-10 MED ORDER — CLOPIDOGREL BISULFATE 75 MG PO TABS: 75.0000 mg | ORAL_TABLET | Freq: Every day | ORAL | 3 refills | Status: DC

## 2021-11-10 NOTE — Patient Instructions (Signed)
Medication Instructions:  Your physician has recommended you make the following change in your medication:  Stop Brilinta. Start Clopidogrel 75 mg by mouth daily  *If you need a refill on your cardiac medications before your next appointment, please call your pharmacy*   Lab Work: none If you have labs (blood work) drawn today and your tests are completely normal, you will receive your results only by: [place] (if you have MyChart) OR A paper copy in the mail If you have any lab test that is abnormal or we need to change your treatment, we will call you to review the results.   Testing/Procedures: none   Follow-Up: At [facility], you and your health needs are our priority.  As part of our continuing mission to provide you with exceptional heart care, we have created designated Provider Care Teams.  These Care Teams include your primary Cardiologist (physician) and Advanced Practice Providers (APPs -  Physician Assistants and Nurse Practitioners) who all work together to provide you with the care you need, when you need it.  We recommend signing up for the patient portal called "MyChart".  Sign up information is provided on this After Visit Summary.  MyChart is used to connect with patients for Virtual Visits (Telemedicine).  Patients are able to view lab/test results, encounter notes, upcoming appointments, etc.  Non-urgent messages can be sent to your provider as well.   To learn more about what you can do with MyChart, go to [email or web address].    Your next appointment:   12 month(s)  The format for your next appointment:   In Person  Provider:   Larae Grooms, MD     Other Instructions Let us know if you continue to have problems with bruising

## 2021-11-12 ENCOUNTER — Encounter (INDEPENDENT_AMBULATORY_CARE_PROVIDER_SITE_OTHER): Payer: Self-pay

## 2021-11-27 ENCOUNTER — Other Ambulatory Visit: Payer: Self-pay | Admitting: Family Medicine

## 2021-12-08 ENCOUNTER — Ambulatory Visit: Payer: No Typology Code available for payment source | Admitting: Family Medicine

## 2021-12-10 ENCOUNTER — Ambulatory Visit (INDEPENDENT_AMBULATORY_CARE_PROVIDER_SITE_OTHER): Payer: No Typology Code available for payment source | Admitting: Family Medicine

## 2021-12-10 VITALS — BP 150/90 | HR 65 | Temp 98.2°F | Wt 301.9 lb

## 2021-12-10 DIAGNOSIS — Z1159 Encounter for screening for other viral diseases: Secondary | ICD-10-CM | POA: Diagnosis not present

## 2021-12-10 DIAGNOSIS — Z23 Encounter for immunization: Secondary | ICD-10-CM

## 2021-12-10 DIAGNOSIS — Z1211 Encounter for screening for malignant neoplasm of colon: Secondary | ICD-10-CM

## 2021-12-10 NOTE — Patient Instructions (Signed)
Remember to get Shingrix vaccine soon.

## 2021-12-10 NOTE — Progress Notes (Signed)
Established Patient Office Visit  Subjective:  Patient ID: Hector [name], male    DOB: [birth year]  Age: 50 y.o. MRN: [id number]  CC: No chief complaint on file.   HPI [name] presents for several preventative concerns as below.  He has history of CAD, hypertension, GERD, panic disorder, metabolic syndrome.  He had been going to weight loss clinic but stopped on his own.  He had concerns about getting shingles vaccine.  He also has not had prior Pneumovax and would like to prioritize and get the Pneumovax first.  He is requesting hepatitis C screening.  Only risk factor is tattoo about 12 years ago.  He had colonoscopy 2005 and would like to get repeat.  Positive family history of colon polyps but no family history of colon cancer.  No recent stool changes.  He does take Plavix but has been given permission by his cardiologist to stop for 5 days prior to colonoscopy.  Past Medical History:  Diagnosis Date   Alcohol abuse    Allergy    Anal fissure    Anxiety    Asthma    Back pain    Bilateral swelling of feet    Blood in stool    Chest pain    Coronary artery disease    Depression    Drug use    Febrile seizure ([place])    GERD (gastroesophageal reflux disease)    Heartburn    Hyperlipidemia    Hypertension    Irritable bowel syndrome with diarrhea    Jaundice, newborn    Palpitations    Panic    Panic disorder    Pre-diabetes    Seizures (HCC)    febrile   Shortness of breath    Sleep apnea     Past Surgical History:  Procedure Laterality Date   CARDIAC CATHETERIZATION     COLONOSCOPY  2005   CORONARY STENT INTERVENTION N/A 08/19/2020   Procedure: CORONARY STENT INTERVENTION;  Surgeon: Jettie Booze, MD;  Location: [facility];  Service: Cardiovascular;  Laterality: N/A;   HEMORRHOID BANDING     INTRAVASCULAR ULTRASOUND/IVUS N/A 08/19/2020   Procedure: Intravascular Ultrasound/IVUS;  Surgeon: Jettie Booze, MD;  Location: [facility];   Service: Cardiovascular;  Laterality: N/A;   LEFT HEART CATH AND CORONARY ANGIOGRAPHY N/A 08/19/2020   Procedure: LEFT HEART CATH AND CORONARY ANGIOGRAPHY;  Surgeon: Jettie Booze, MD;  Location: [facility];  Service: Cardiovascular;  Laterality: N/A;   SIGMOIDOSCOPY     WISDOM TOOTH EXTRACTION  1991    Family History  Problem Relation Age of Onset   Dementia Mother    Stroke Mother    Hypertension Mother    Skin cancer Mother    Basal cell carcinoma Mother    Squamous cell carcinoma Mother    Cancer Mother    Depression Mother    Diabetes Father    Hyperlipidemia Father    Stroke Father    Heart disease Father 42   Colon polyps Father    Heart attack Father    Hypertension Father    Depression Father    Sleep apnea Father    Obesity Father    Colitis Brother    Colon polyps Brother     Social History   Socioeconomic History   Marital status: Divorced    Spouse name: Not on file   Number of children: 1   Years of education: Not on file  Highest education level: Bachelor's degree (e.g., BA, AB, BS)  Occupational History   Occupation: Herbalist  Tobacco Use   Smoking status: Never   Smokeless tobacco: Never  Vaping Use   Vaping Use: Former  Substance and Sexual Activity   Alcohol use: Yes    Alcohol/week: 0.0 standard drinks    Comment: 1-5 per week   Drug use: Yes    Types: Marijuana    Comment: Reports sometimes he smokes daily sometimes weekly   Sexual activity: Yes  Other Topics Concern   Not on file  Social History Narrative   Patient is right-handed. He lives in a 2 story home. He drinks 2 20 oz sodas a day. He does not exercise.   Social Determinants of Health   Financial Resource Strain: Low Risk    Difficulty of Paying Living Expenses: Not hard at all  Food Insecurity: No Food Insecurity   Worried About Charity fundraiser in the Last Year: Never true   [place] in the Last Year: Never true  Transportation Needs:  No Transportation Needs   Lack of Transportation (Medical): No   Lack of Transportation (Non-Medical): No  Physical Activity: Unknown   Days of Exercise per Week: 0 days   Minutes of Exercise per Session: Not on file  Stress: Stress Concern Present   Feeling of Stress : To some extent  Social Connections: Socially Isolated   Frequency of Communication with Friends and Family: Once a week   Frequency of Social Gatherings with Friends and Family: Once a week   Attends Religious Services: Never   Marine scientist or Organizations: No   Attends Music therapist: Not on file   Marital Status: Divorced  Human resources officer Violence: Not on file    Outpatient Medications Prior to Visit  Medication Sig Dispense Refill   aspirin EC 81 MG tablet Take 81 mg by mouth daily. Swallow whole.     atorvastatin (LIPITOR) 80 MG tablet Take 1 tablet (80 mg total) by mouth daily. (Patient taking differently: Take 80 mg by mouth daily. 1/2 tablet daily) 90 tablet 3   clopidogrel (PLAVIX) 75 MG tablet Take 1 tablet (75 mg total) by mouth daily. 90 tablet 3   FLUoxetine (PROZAC) 10 MG capsule TAKE 1 CAPSULE DAILY 90 capsule 0   LORazepam (ATIVAN) 1 MG tablet Take 1 tablet (1 mg total) by mouth every 8 (eight) hours as needed. for anxiety 30 tablet 0   losartan (COZAAR) 50 MG tablet Take 1 tablet (50 mg total) by mouth daily. 30 tablet 2   nebivolol (BYSTOLIC) 10 MG tablet TAKE 1 TABLET ONCE DAILY 90 tablet 1   nitroGLYCERIN (NITROSTAT) 0.4 MG SL tablet PLACE 1 TABLET UNDER THE TONGUE EVERY 5 MINUTES AS NEEDED FOR CHEST PAIN. 25 tablet 0   tadalafil (CIALIS) 10 MG tablet Take 10 mg by mouth daily as needed for erectile dysfunction.     Vitamin D, Ergocalciferol, (DRISDOL) 1.25 MG (50000 UNIT) CAPS capsule Take 1 capsule (50,000 Units total) by mouth every 7 (seven) days. 12 capsule 0   No facility-administered medications prior to visit.    Allergies  Allergen Reactions   Other Diarrhea     GLP-1RA    ROS Review of Systems  Constitutional:  Negative for fatigue.  Eyes:  Negative for visual disturbance.  Respiratory:  Negative for cough, chest tightness and shortness of breath.   Cardiovascular:  Negative for chest pain, palpitations and  leg swelling.  Endocrine: Negative for polydipsia and polyuria.  Neurological:  Negative for dizziness, syncope, weakness, light-headedness and headaches.     Objective:    Physical Exam Vitals reviewed.  Cardiovascular:     Rate and Rhythm: Normal rate and regular rhythm.  Pulmonary:     Effort: Pulmonary effort is normal.     Breath sounds: Normal breath sounds.    BP (!) 150/90 (BP Location: Left Arm, Patient Position: Sitting, Cuff Size: Normal)    Pulse 65    Temp 98.2 F (36.8 C) (Oral)    Wt (!) 301 lb 14.4 oz (136.9 kg)    SpO2 98%    BMI 44.58 kg/m  Wt Readings from Last 3 Encounters:  12/10/21 (!) 301 lb 14.4 oz (136.9 kg)  11/10/21 298 lb 12.8 oz (135.5 kg)  10/29/21 290 lb (131.5 kg)     Health Maintenance Due  Topic Date Due   Pneumococcal Vaccine 15-63 Years old (1 - PCV) Never done   HIV Screening  Never done   Hepatitis C Screening  Never done   COLONOSCOPY (Pts 45-58yr Insurance coverage will need to be confirmed)  10/13/2016   COVID-19 Vaccine (4 - Booster for Pfizer series) 12/21/2020   Zoster Vaccines- Shingrix (1 of 2) Never done    There are no preventive care reminders to display for this patient.  Lab Results  Component Value Date   TSH 3.210 09/03/2021   Lab Results  Component Value Date   WBC 8.3 09/03/2021   HGB 14.6 09/03/2021   HCT 42.8 09/03/2021   MCV 90 09/03/2021   PLT 158 09/03/2021   Lab Results  Component Value Date   NA 143 09/03/2021   K 4.1 09/03/2021   CO2 23 09/03/2021   GLUCOSE 101 (H) 09/03/2021   BUN 19 09/03/2021   CREATININE 1.07 09/03/2021   BILITOT 0.4 09/03/2021   ALKPHOS 96 09/03/2021   AST 19 09/03/2021   ALT 18 09/03/2021   PROT 6.7 09/03/2021    ALBUMIN 4.2 09/03/2021   CALCIUM 8.9 09/03/2021   ANIONGAP 13 08/07/2020   EGFR 85 09/03/2021   GFR 100.42 06/12/2019   Lab Results  Component Value Date   CHOL 126 09/03/2021   Lab Results  Component Value Date   HDL 35 (L) 09/03/2021   Lab Results  Component Value Date   LDLCALC 69 09/03/2021   Lab Results  Component Value Date   TRIG 122 09/03/2021   Lab Results  Component Value Date   CHOLHDL 3.6 09/03/2021   Lab Results  Component Value Date   HGBA1C 5.3 09/03/2021      Assessment & Plan:   Problem List Items Addressed This Visit   None Visit Diagnoses     Colon cancer screening    -  Primary   Relevant Orders   Ambulatory referral to Gastroenterology   Encounter for hepatitis C screening test for low risk patient       Relevant Orders   Hep C Antibody     Patient came in with several health maintenance concerns and these were addressed as below  -Flu vaccine already given-he thinks -Pneumovax given -Do recommend shingles vaccine but he would like to wait and get this at a later time.  We have asked that he confirm with insurance for his coverage -Set up repeat colonoscopy.  He knows he will need to come off the Plavix for about 5 days prior to colonoscopy.  No orders of the  defined types were placed in this encounter.   Follow-up: No follow-ups on file.    Carolann Littler, MD

## 2021-12-11 LAB — HEPATITIS C ANTIBODY
Hepatitis C Ab: NONREACTIVE
SIGNAL TO CUT-OFF: <0.02 (ref <1.00)

## 2021-12-24 ENCOUNTER — Other Ambulatory Visit: Payer: Self-pay | Admitting: Family Medicine

## 2021-12-25 ENCOUNTER — Encounter: Payer: Self-pay | Admitting: Interventional Cardiology

## 2021-12-25 ENCOUNTER — Encounter: Payer: Self-pay | Admitting: Family Medicine

## 2021-12-25 MED ORDER — FLUOXETINE HCL 10 MG PO CAPS
10.0000 mg | ORAL_CAPSULE | Freq: Every day | ORAL | 0 refills | Status: DC
Start: 2021-12-25 — End: 2022-01-07

## 2022-01-01 ENCOUNTER — Other Ambulatory Visit: Payer: Self-pay | Admitting: Family Medicine

## 2022-01-07 MED ORDER — FLUOXETINE HCL 10 MG PO CAPS: 10.0000 mg | ORAL_CAPSULE | Freq: Every day | ORAL | 1 refills | Status: DC

## 2022-01-07 NOTE — Addendum Note (Signed)
Addended by: Rebecca Eaton on: 01/07/2022 11:51 AM   Modules accepted: Orders

## 2022-01-08 MED ORDER — FLUOXETINE HCL 10 MG PO CAPS: 10.0000 mg | ORAL_CAPSULE | Freq: Every day | ORAL | 0 refills | Status: DC

## 2022-01-08 NOTE — Addendum Note (Signed)
Addended by: Rebecca Eaton on: 01/08/2022 12:07 PM   Modules accepted: Orders

## 2022-01-14 ENCOUNTER — Telehealth: Payer: Self-pay | Admitting: *Deleted

## 2022-01-14 NOTE — Telephone Encounter (Signed)
Our office received a letter with letter head from Dr. Delphia Grates, DDS in regard to clearance for dental procedure. I did reach out to the dental office for clarification as to procedure to be done.     Pre-operative Risk Assessment    Patient Name: Hector [name]  DOB: December 05, 1971 MRN: [id number]     Request for Surgical Clearance    Procedure:   1 DENTAL IMPLANT TO BE PLACED; TOOTH IS ALREADY MISSING AND DR. HATCHER HAS A LIMITED AMOUNT OF TIME BE SURE IMPLANT IS SUCCESSFUL PERR DDS OFFICE  Date of Surgery:  Clearance 01/28/22                                 Surgeon:  DR. Delphia Grates, DDS Surgeon's Group or Practice Name:  [facility] Phone number:  [phone] Fax number:  [phone]   Type of Clearance Requested:   - Medical  PER DR. HATCHER THE PT DOES NOT NEED TO COME OFF HIS BLOOD THINNERS   Type of Anesthesia:  Local  w/EPI or other option LOCAL w/o EPI    Additional requests/questions:    Jiles Prows   01/14/2022, 8:49 AM

## 2022-01-14 NOTE — Telephone Encounter (Signed)
Note, patient mentioned he likely will need a colonoscopy coming up soon.  He denies any recent exertional chest pain or worsening dyspnea.  He should be cleared to proceed with colonoscopy.  According to Dr. Hassell Done last office note, "OK to hold antiplatelet therapy for 5 day before colonoscopy"

## 2022-01-14 NOTE — Telephone Encounter (Signed)
° °  Patient Name: Hector [name]  DOB: Aug 27, 1971 MRN: [id number]  Primary Cardiologist: Larae Grooms, MD  Chart reviewed as part of pre-operative protocol coverage.   Simple (1-2 teeth) dental extractions, cleaning or filling are considered low risk procedures per guidelines and generally do not require any specific cardiac clearance. It is also generally accepted that for simple extractions and dental cleanings, there is no need to interrupt blood thinner therapy.  SBE prophylaxis is not required for the patient from a cardiac standpoint.  I will route this recommendation to the requesting party via Epic fax function and remove from pre-op pool.  Please call with questions.  [place], [place] 01/14/2022, 1:45 PM

## 2022-01-16 ENCOUNTER — Ambulatory Visit: Payer: No Typology Code available for payment source | Admitting: Nurse Practitioner

## 2022-01-16 ENCOUNTER — Encounter: Payer: Self-pay | Admitting: Nurse Practitioner

## 2022-01-16 VITALS — BP 138/80 | HR 85 | Ht 69.0 in | Wt 299.0 lb

## 2022-01-16 DIAGNOSIS — K625 Hemorrhage of anus and rectum: Secondary | ICD-10-CM | POA: Diagnosis not present

## 2022-01-16 DIAGNOSIS — I2510 Atherosclerotic heart disease of native coronary artery without angina pectoris: Secondary | ICD-10-CM | POA: Diagnosis not present

## 2022-01-16 NOTE — Patient Instructions (Addendum)
PROCEDURES: You have been scheduled for a colonoscopy. Please follow the written instructions given to you at your visit today. If you use inhalers (even only as needed), please bring them with you on the day of your procedure. Hold your Plavix 5 days before the colonoscopy as advised by cardiology.  RECOMMENDATIONS:  Desitin: Apply a small amount to the external and internal anal area three times a day as needed.  It was great seeing you today! Thank you for entrusting me with your care and choosing [facility].  Noralyn Pick, CRNP  The [facility] providers would like to encourage you to use [facility] to communicate with providers for non-urgent requests or questions.  Due to long hold times on the telephone, sending your provider a message by [facility] may be faster and more efficient way to get a response. Please allow 48 business hours for a response.  Please remember that this is for non-urgent requests/questions.  If you are age 46 or older, your body mass index should be between 23-30. Your Body mass index is 44.15 kg/m. If this is out of the aforementioned range listed, please consider follow up with your Primary Care Provider.  If you are age 69 or younger, your body mass index should be between 19-25. Your Body mass index is 44.15 kg/m. If this is out of the aformentioned range listed, please consider follow up with your Primary Care Provider.

## 2022-01-16 NOTE — Progress Notes (Signed)
01/16/2022 JR MILLIRON [id number] [birth year]   Chief Complaint: Schedule a colonoscopy   History of Present Illness: Hector [name] is a 51 year old male with a past medical history of CAD s/p DES 08/19/2020 on Plavix, alcohol use disorder, anxiety, sleep apnea and IBS symptoms.   He presents to our office today as referred by Dr. Elease Hashimoto to schedule a colonoscopy. He passes 3 brown soft to loose stools daily. He feels he has fast stool transit. He occasional sees a spot or streak of blood on the toilet tissue. No associated anorectal pain. No abdominal pain. He has infrequent heartburn which he had 6 months ago and last occurred 3 weeks ago. No dysphagia. He drinks 3 to 4 beers once monthly or less. He smokes marijuana several days weekly. He underwent a colonoscopy in 2005, results unclear. Father and brother with history of colon polyps.  No other complaints today.  Cardiac cath 08/19/2020: Mid LAD lesion is 95% stenosed. Right to left collaterals. A drug-eluting stent was successfully placed using a STENT RESOLUTE ONYX 3.0X30, postdilated to > 4 mm in the mid and proximal stent and optimized with IVUS. Post intervention, there is a 0% residual stenosis. Prox RCA lesion is 30% stenosed. Minimal circumflex disease. The left ventricular systolic function is normal. LV end diastolic pressure is normal. The left ventricular ejection fraction is 55-65% by visual estimate. There is no aortic valve stenosis. Prox LAD lesion is 20% stenosed.   Severe single vessel CAD.   Initially on Brilinta x 30 days then transitioned to Plavix    CBC Latest Ref Rng & Units 09/03/2021 12/02/2020 08/07/2020  WBC 3.4 - 10.8 x10E3/uL 8.3 7.0 4.5  Hemoglobin 13.0 - 17.7 g/dL 14.6 15.0 15.2  Hematocrit 37.5 - 51.0 % 42.8 44.4 44.2  Platelets 150 - 450 x10E3/uL 158 166 180    CMP Latest Ref Rng & Units 09/03/2021 12/02/2020 09/26/2020  Glucose 65 - 99 mg/dL 101(H) 106(H) 120(H)  BUN 6 - 24 mg/dL 19 25(H) 22   Creatinine 0.76 - 1.27 mg/dL 1.07 1.16 1.32  Sodium 134 - 144 mmol/L 143 141 142  Potassium 3.5 - 5.2 mmol/L 4.1 4.2 4.2  Chloride 96 - 106 mmol/L 106 107(H) 112(H)  CO2 20 - 29 mmol/L 23 21 19(L)  Calcium 8.7 - 10.2 mg/dL 8.9 8.8 9.2  Total Protein 6.0 - 8.5 g/dL 6.7 6.6 -  Total Bilirubin 0.0 - 1.2 mg/dL 0.4 0.4 -  Alkaline Phos 44 - 121 IU/L 96 71 -  AST 0 - 40 IU/L 19 16 -  ALT 0 - 44 IU/L 18 18 -     Past Medical History:  Diagnosis Date   Alcohol abuse    Allergy    Anal fissure    Anxiety    Asthma    Back pain    Bilateral swelling of feet    Blood in stool    Chest pain    Coronary artery disease    Depression    Drug use    Febrile seizure (HCC)    GERD (gastroesophageal reflux disease)    Heartburn    Hyperlipidemia    Hypertension    Irritable bowel syndrome with diarrhea    Jaundice, newborn    Palpitations    Panic    Panic disorder    Pre-diabetes    Seizures (HCC)    febrile   Shortness of breath    Sleep apnea    Past Surgical  History:  Procedure Laterality Date   CARDIAC CATHETERIZATION     COLONOSCOPY  2005   CORONARY STENT INTERVENTION N/A 08/19/2020   Procedure: CORONARY STENT INTERVENTION;  Surgeon: Jettie Booze, MD;  Location: [facility];  Service: Cardiovascular;  Laterality: N/A;   HEMORRHOID BANDING     INTRAVASCULAR ULTRASOUND/IVUS N/A 08/19/2020   Procedure: Intravascular Ultrasound/IVUS;  Surgeon: Jettie Booze, MD;  Location: [facility];  Service: Cardiovascular;  Laterality: N/A;   LEFT HEART CATH AND CORONARY ANGIOGRAPHY N/A 08/19/2020   Procedure: LEFT HEART CATH AND CORONARY ANGIOGRAPHY;  Surgeon: Jettie Booze, MD;  Location: [facility];  Service: Cardiovascular;  Laterality: N/A;   SIGMOIDOSCOPY     WISDOM TOOTH EXTRACTION  1991   Current Outpatient Medications on File Prior to Visit  Medication Sig Dispense Refill   aspirin EC 81 MG tablet Take 81 mg by mouth daily. Swallow  whole.     atorvastatin (LIPITOR) 80 MG tablet Take 1 tablet (80 mg total) by mouth daily. (Patient taking differently: Take 80 mg by mouth daily. 1/2 tablet daily) 90 tablet 3   clopidogrel (PLAVIX) 75 MG tablet Take 1 tablet (75 mg total) by mouth daily. 90 tablet 3   FLUoxetine (PROZAC) 10 MG capsule Take 1 capsule (10 mg total) by mouth daily. 14 capsule 0   LORazepam (ATIVAN) 1 MG tablet Take 1 tablet (1 mg total) by mouth every 8 (eight) hours as needed. for anxiety 30 tablet 0   losartan (COZAAR) 50 MG tablet Take 1 tablet (50 mg total) by mouth daily. 30 tablet 2   nebivolol (BYSTOLIC) 10 MG tablet TAKE 1 TABLET ONCE DAILY 90 tablet 1   nitroGLYCERIN (NITROSTAT) 0.4 MG SL tablet PLACE 1 TABLET UNDER THE TONGUE EVERY 5 MINUTES AS NEEDED FOR CHEST PAIN. 25 tablet 0   tadalafil (CIALIS) 10 MG tablet Take 10 mg by mouth daily as needed for erectile dysfunction.     Vitamin D, Ergocalciferol, (DRISDOL) 1.25 MG (50000 UNIT) CAPS capsule Take 1 capsule (50,000 Units total) by mouth every 7 (seven) days. 12 capsule 0   No current facility-administered medications on file prior to visit.   Allergies  Allergen Reactions   Other Diarrhea    GLP-1RA   Current Medications, Allergies, Past Medical History, Past Surgical History, Family History and Social History were reviewed in [employer] record.  Review of Systems:   Constitutional: Negative for fever, sweats, chills or weight loss.  Respiratory: Negative for shortness of breath.   Cardiovascular: Negative for chest pain, palpitations and leg swelling.  Gastrointestinal: See HPI.  Musculoskeletal: Negative for back pain or muscle aches.  Neurological: Negative for dizziness, headaches or paresthesias.    Physical Exam: BP 138/80    Pulse 85    Ht 5' 9"  (1.753 m)    Wt 299 lb (135.6 kg)    SpO2 97%    BMI 44.15 kg/m  General: 51 year old male in NAD. Head: Normocephalic and atraumatic. Eyes: No scleral icterus.  Conjunctiva pink . Ears: Normal auditory acuity. Mouth: Dentition intact. No ulcers or lesions.  Lungs: Clear throughout to auscultation. Heart: Regular rate and rhythm, no murmur. Abdomen: Soft, nontender and nondistended. No masses or hepatomegaly. Normal bowel sounds x 4 quadrants.  Rectal: Deferred.  Musculoskeletal: Symmetrical with no gross deformities. Extremities: No edema. Neurological: Alert oriented x 4. No focal deficits.  Psychological: Alert and cooperative. Normal mood and affect  Assessment and Recommendations:  64) 51 year old male with occasional rectal bleeding, small amount of red blood on the toilet tissue. Family history of colon polyps.  -Colonoscopy benefits and risks discussed including risk with sedation, risk of bleeding, perforation and infection  -Per cardiologist Dr. Irish Lack patient ok to hold Plavix x 5 days prior to colonoscopy  -Apply a small amount of Desitin inside the anal opening and to the external anal area tid as needed for anal or hemorrhoidal irritation/bleeding.   2) CAD s/p DES 07/2020 on Plavix  Per cardiologist Dr. Irish Lack patient ok to hold Plavix x 5 days prior to colonoscopy. Refer to cardiology office note 11/10/2021. Patient denies any change in cardiac status since he was seen by Dr. Irish Lack. No CP. Plavix instructions prior to colonoscopy were also re-reviewed by Almyra Deforest PA-C on 01/14/2022, ok to hold Plavix x 5 days prior to procedure.   3) GERD symptoms , infrequent

## 2022-01-19 NOTE — Progress Notes (Signed)
Addendum: Reviewed and agree with assessment and management plan. Carie Kapuscinski M, MD  

## 2022-01-26 ENCOUNTER — Telehealth: Payer: Self-pay

## 2022-01-26 NOTE — Telephone Encounter (Signed)
-----   Message from Noralyn Pick, NP sent at 01/26/2022  9:14 AM EST ----- Beth, pls contact the patient and let him know he can take Ativan 59m po with a sip of water 3 to 4 hrs before his procedure time, see further details in msg below. Thx  ----- Message ----- From: PJerene Bears MD Sent: 01/26/2022   8:44 AM EST To: CNoralyn Pick NP  Yes, that would be fine Thanks JUlice Dash ----- Message ----- From: KNoralyn Pick NP Sent: 01/23/2022   1:16 PM EST To: JJerene Bears MD  Hi Dr. PHilarie Fredrickson patient is scheduled for colonoscopy with you on 02/09/2022.  Can he take Ativan 1 mg p.o. with a sip of water like 3 to 4 hours before his procedure time as long as his consent form was previously signed long as he has someone to drive him to the endoscopy center?  He stated he would get really anxious if he did not take his Ativan prior to the procedure.  Appreciate your input,  CJaclyn Shaggy

## 2022-01-26 NOTE — Telephone Encounter (Signed)
Called to inform the patient. No answer. Left the information on his voice mail and my name to call if he has further questions.

## 2022-01-30 ENCOUNTER — Encounter: Payer: Self-pay | Admitting: Interventional Cardiology

## 2022-01-30 ENCOUNTER — Other Ambulatory Visit: Payer: Self-pay | Admitting: Family Medicine

## 2022-02-03 ENCOUNTER — Encounter: Payer: Self-pay | Admitting: Internal Medicine

## 2022-02-09 ENCOUNTER — Encounter: Payer: Self-pay | Admitting: Internal Medicine

## 2022-02-09 ENCOUNTER — Ambulatory Visit (AMBULATORY_SURGERY_CENTER): Payer: No Typology Code available for payment source | Admitting: Internal Medicine

## 2022-02-09 VITALS — BP 129/80 | HR 57 | Temp 98.0°F | Resp 9 | Ht 69.0 in | Wt 299.0 lb

## 2022-02-09 DIAGNOSIS — K5730 Diverticulosis of large intestine without perforation or abscess without bleeding: Secondary | ICD-10-CM

## 2022-02-09 DIAGNOSIS — K635 Polyp of colon: Secondary | ICD-10-CM | POA: Diagnosis not present

## 2022-02-09 DIAGNOSIS — K648 Other hemorrhoids: Secondary | ICD-10-CM

## 2022-02-09 DIAGNOSIS — D123 Benign neoplasm of transverse colon: Secondary | ICD-10-CM

## 2022-02-09 DIAGNOSIS — K625 Hemorrhage of anus and rectum: Secondary | ICD-10-CM

## 2022-02-09 MED ORDER — SODIUM CHLORIDE 0.9 % IV SOLN: 500.0000 mL | Freq: Once | INTRAVENOUS | Status: DC

## 2022-02-09 NOTE — Patient Instructions (Signed)
Handouts provided on polyps, diverticulosis, hemorrhoids and hemorrhoid banding.   Resume Plavix (clopidogrel) at prior dose today. Refer to managing physician for further adjustment of therapy.   YOU HAD AN ENDOSCOPIC PROCEDURE TODAY AT [facility]:   Refer to the procedure report that was given to you for any specific questions about what was found during the examination.  If the procedure report does not answer your questions, please call your gastroenterologist to clarify.  If you requested that your care partner not be given the details of your procedure findings, then the procedure report has been included in a sealed envelope for you to review at your convenience later.  YOU SHOULD EXPECT: Some feelings of bloating in the abdomen. Passage of more gas than usual.  Walking can help get rid of the air that was put into your GI tract during the procedure and reduce the bloating. If you had a lower endoscopy (such as a colonoscopy or flexible sigmoidoscopy) you may notice spotting of blood in your stool or on the toilet paper. If you underwent a bowel prep for your procedure, you may not have a normal bowel movement for a few days.  Please Note:  You might notice some irritation and congestion in your nose or some drainage.  This is from the oxygen used during your procedure.  There is no need for concern and it should clear up in a day or so.  SYMPTOMS TO REPORT IMMEDIATELY:  Following lower endoscopy (colonoscopy or flexible sigmoidoscopy):  Excessive amounts of blood in the stool  Significant tenderness or worsening of abdominal pains  Swelling of the abdomen that is new, acute  Fever of 100F or higher  For urgent or emergent issues, a gastroenterologist can be reached at any hour by calling [phone]. Do not use MyChart messaging for urgent concerns.    DIET:  We do recommend a small meal at first, but then you may proceed to your regular diet.  Drink plenty of  fluids but you should avoid alcoholic beverages for 24 hours.  ACTIVITY:  You should plan to take it easy for the rest of today and you should NOT DRIVE or use heavy machinery until tomorrow (because of the sedation medicines used during the test).    FOLLOW UP: Our staff will call the number listed on your records 48-72 hours following your procedure to check on you and address any questions or concerns that you may have regarding the information given to you following your procedure. If we do not reach you, we will leave a message.  We will attempt to reach you two times.  During this call, we will ask if you have developed any symptoms of COVID 19. If you develop any symptoms (ie: fever, flu-like symptoms, shortness of breath, cough etc.) before then, please call [phone].  If you test positive for Covid 19 in the 2 weeks post procedure, please call and report this information to [place].    If any biopsies were taken you will be contacted by phone or by letter within the next 1-3 weeks.  Please call us at [phone] if you have not heard about the biopsies in 3 weeks.    SIGNATURES/CONFIDENTIALITY: You and/or your care partner have signed paperwork which will be entered into your electronic medical record.  These signatures attest to the fact that that the information above on your After Visit Summary has been reviewed and is understood.  Full responsibility of the confidentiality of  this discharge information lies with you and/or your care-partner.

## 2022-02-09 NOTE — Progress Notes (Signed)
See office note dated 01/16/22 for details For colonoscopy today. He remains appropriate for [facility] colonoscopy Plavix on hold x 5 days

## 2022-02-09 NOTE — Progress Notes (Signed)
°  Vs by DT in adm  Pt's states no medical or surgical changes since previsit or office visit.

## 2022-02-09 NOTE — Op Note (Signed)
[place] Patient Name: Hector [name] Procedure Date: 02/09/2022 11:50 AM MRN: [id number] Endoscopist: Jerene Bears , MD Age: 51 Referring MD:  Date of Birth: [birth year] Gender: Male Account #: [id number] Procedure:                Colonoscopy Indications:              Rectal bleeding Medicines:                Monitored Anesthesia Care Procedure:                Pre-Anesthesia Assessment:                           - Prior to the procedure, a History and Physical                            was performed, and patient medications and                            allergies were reviewed. The patient's tolerance of                            previous anesthesia was also reviewed. The risks                            and benefits of the procedure and the sedation                            options and risks were discussed with the patient.                            All questions were answered, and informed consent                            was obtained. Prior Anticoagulants: The patient has                            taken Plavix (clopidogrel), last dose was 5 days                            prior to procedure. ASA Grade Assessment: III - A                            patient with severe systemic disease. After                            reviewing the risks and benefits, the patient was                            deemed in satisfactory condition to undergo the                            procedure.  After obtaining informed consent, the colonoscope                            was passed under direct vision. Throughout the                            procedure, the patient's blood pressure, pulse, and                            oxygen saturations were monitored continuously. The                            Olympus CF-HQ190L [id number]) Colonoscope was                            introduced through the anus and advanced to the                            cecum,  identified by appendiceal orifice and                            ileocecal valve. The colonoscopy was performed                            without difficulty. The patient tolerated the                            procedure well. The quality of the bowel                            preparation was excellent. The ileocecal valve,                            appendiceal orifice, and rectum were photographed. Scope In: 12:01:58 PM Scope Out: 12:18:08 PM Scope Withdrawal Time: 0 hours 9 minutes 3 seconds  Total Procedure Duration: 0 hours 16 minutes 10 seconds  Findings:                 The digital rectal exam was normal.                           A 4 mm polyp was found in the transverse colon. The                            polyp was sessile. The polyp was removed with a                            cold snare. Resection and retrieval were complete.                           Multiple small and large-mouthed diverticula were                            found in the sigmoid colon and descending colon.  Internal hemorrhoids were found during                            retroflexion. The hemorrhoids were small. Complications:            No immediate complications. Estimated Blood Loss:     Estimated blood loss was minimal. Impression:               - One 4 mm polyp in the transverse colon, removed                            with a cold snare. Resected and retrieved.                           - Diverticulosis in the sigmoid colon and in the                            descending colon.                           - Internal hemorrhoids. Most likely cause of                            intermittent red blood per rectum. Recommendation:           - Patient has a contact number available for                            emergencies. The signs and symptoms of potential                            delayed complications were discussed with the                            patient. Return to  normal activities tomorrow.                            Written discharge instructions were provided to the                            patient.                           - Resume previous diet.                           - Continue present medications.                           - Resume Plavix (clopidogrel) at prior dose today.                            Refer to managing physician for further adjustment                            of therapy.                           -  Await pathology results.                           - Repeat colonoscopy is recommended. The                            colonoscopy date will be determined after pathology                            results from today's exam become available for                            review. Jerene Bears, MD 02/09/2022 12:20:53 PM This report has been signed electronically.

## 2022-02-09 NOTE — Progress Notes (Signed)
Called to room to assist during endoscopic procedure.  Patient ID and intended procedure confirmed with present staff. Received instructions for my participation in the procedure from the performing physician.  

## 2022-02-09 NOTE — Progress Notes (Signed)
Report given to PACU, vss 

## 2022-02-11 ENCOUNTER — Telehealth: Payer: Self-pay | Admitting: *Deleted

## 2022-02-11 NOTE — Telephone Encounter (Signed)
°  Follow up Call-  Call back number 02/09/2022  Post procedure Call Back phone  # [phone]  Permission to leave phone message Yes  Some recent data might be hidden     Patient questions:  Do you have a fever, pain , or abdominal swelling? No. Pain Score  0 *  Have you tolerated food without any problems? Yes.    Have you been able to return to your normal activities? Yes.    Do you have any questions about your discharge instructions: Diet   No. Medications  No. Follow up visit  No.  Do you have questions or concerns about your Care? No.  Actions: * If pain score is 4 or above: No action needed, pain <4.

## 2022-02-12 ENCOUNTER — Encounter: Payer: Self-pay | Admitting: Internal Medicine

## 2022-02-13 NOTE — Telephone Encounter (Signed)
I didn't see a question for me to respond to.  As long as he takes his antiplatelet meds, his stent should be fine.   JV

## 2022-04-29 ENCOUNTER — Other Ambulatory Visit: Payer: Self-pay | Admitting: Family Medicine

## 2022-05-11 ENCOUNTER — Other Ambulatory Visit: Payer: Self-pay | Admitting: Family Medicine

## 2022-05-20 IMAGING — CR DG CHEST 2V
2 series · 2 of 2 positions shown · non-contrast
Comparison: Cardiac CT 12/14/2018.

CLINICAL DATA: Chest pain. Additional provided: Chest pain that
runs down both arms.

EXAM:
CHEST - 2 VIEW

[chest pa]
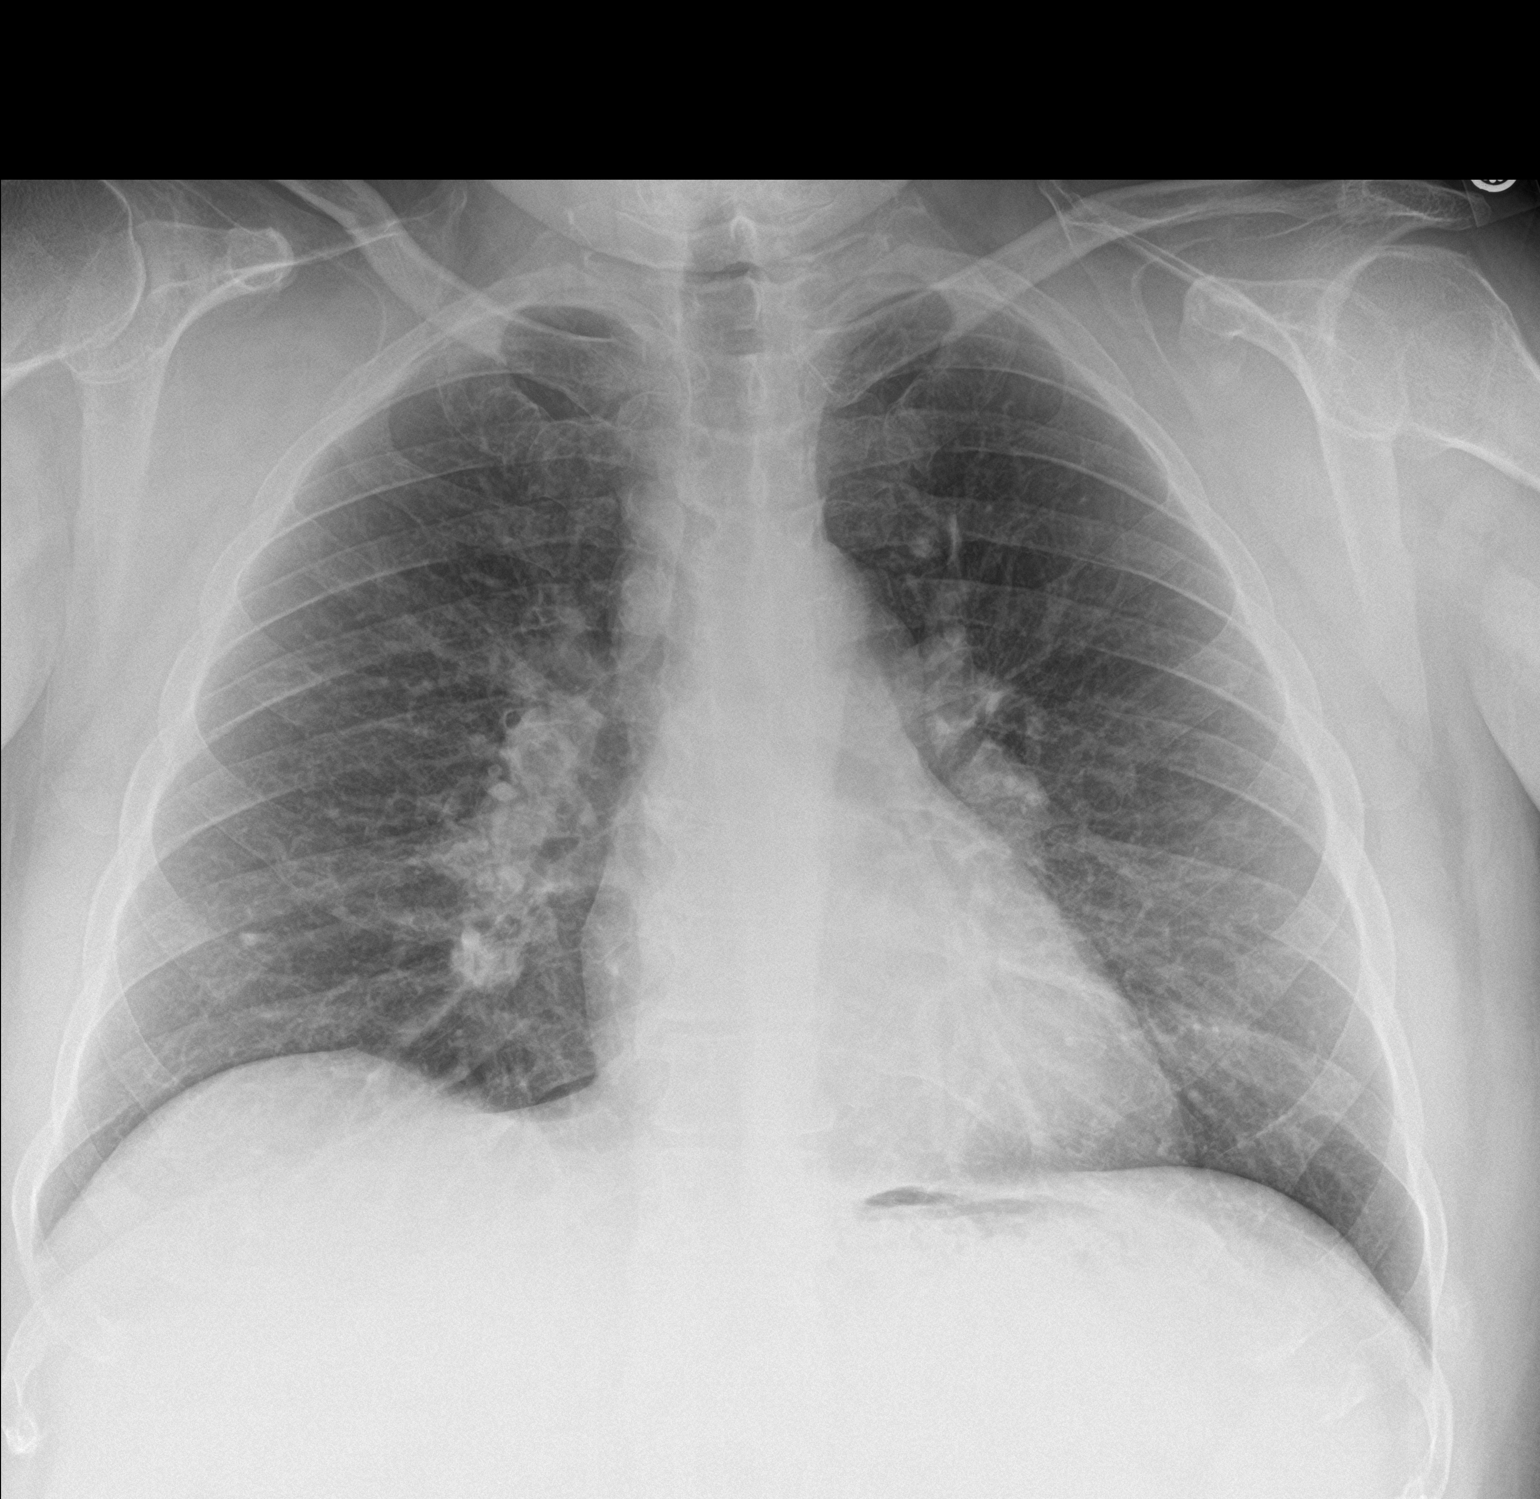

[chest lat]
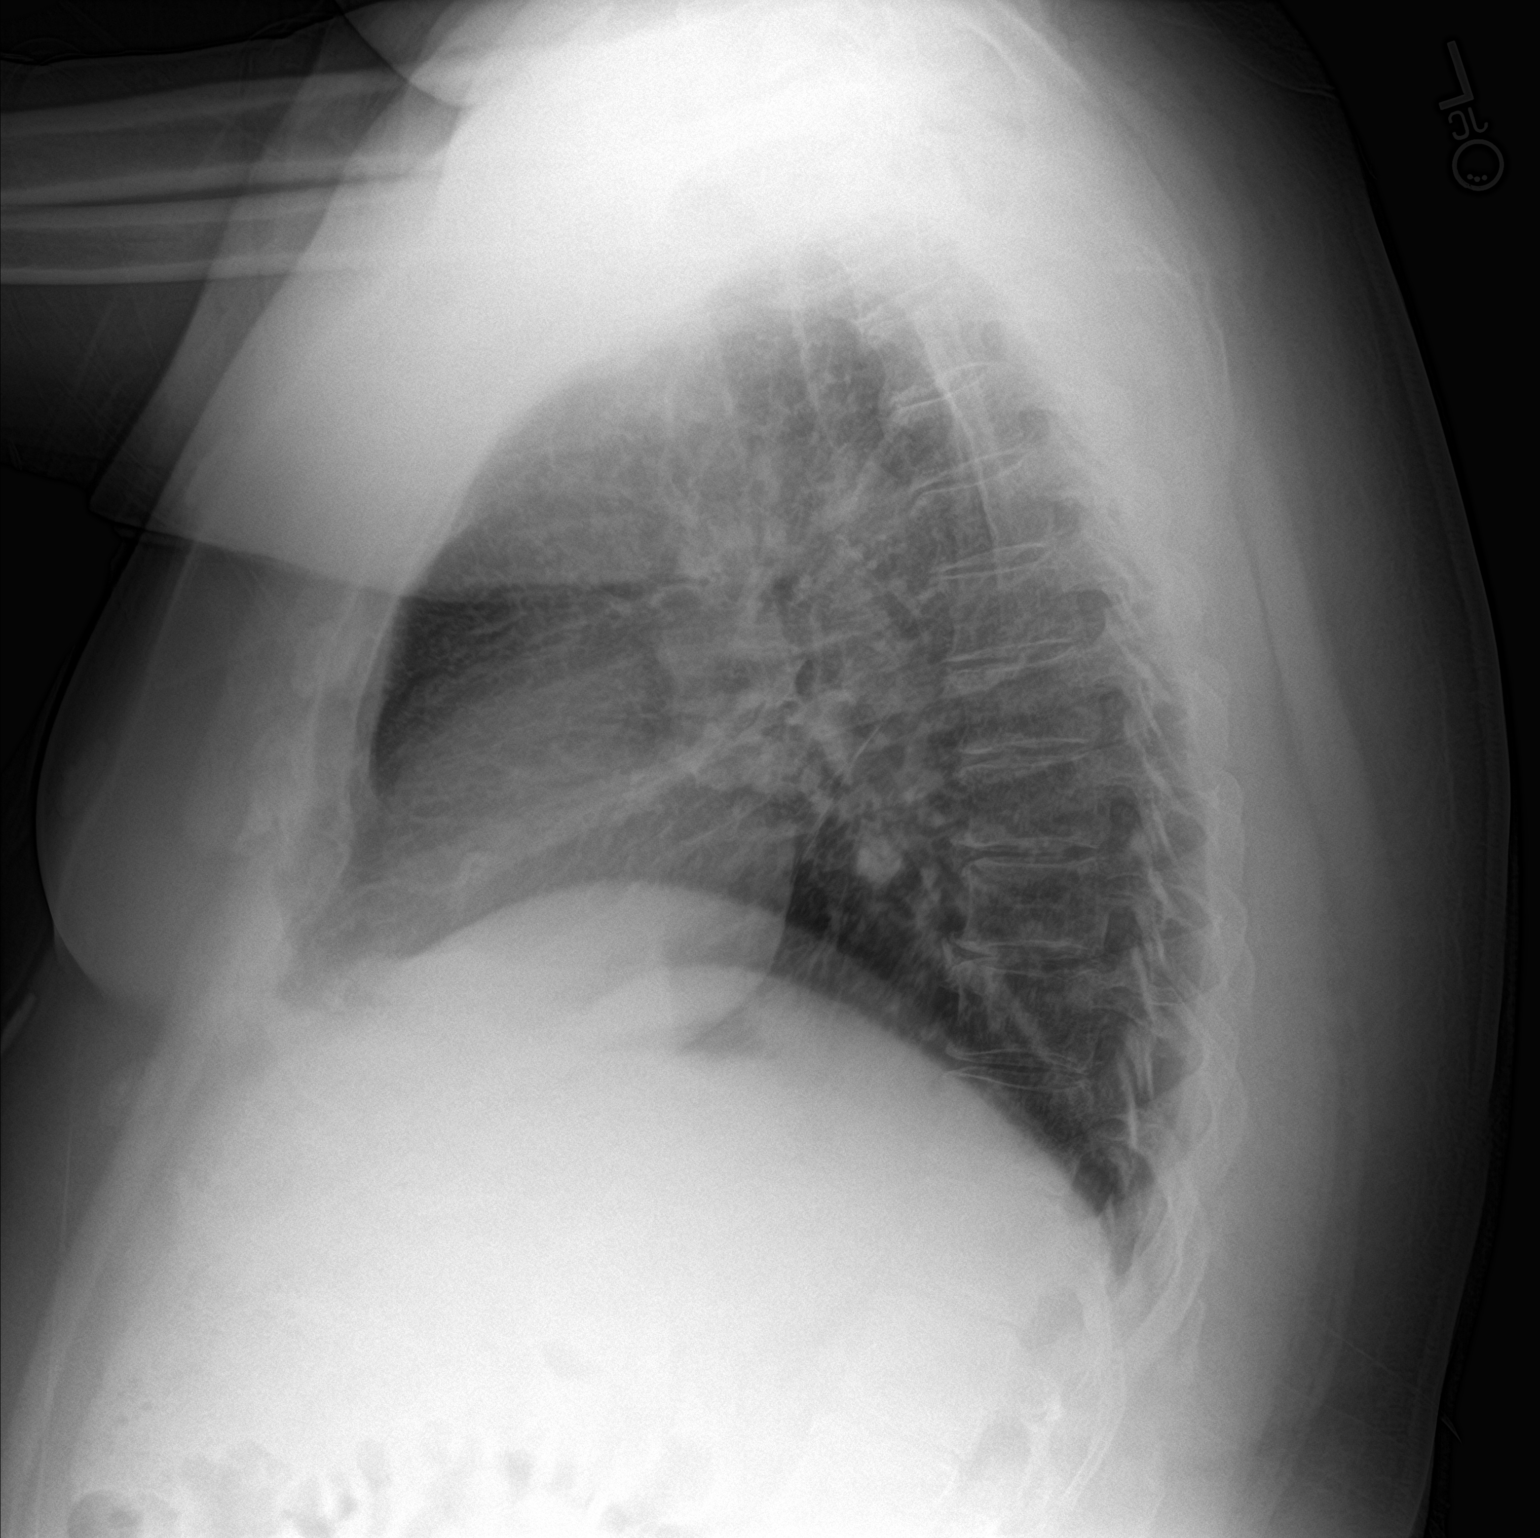

[2 of 2 positions shown; findings below may reference images not displayed]

FINDINGS: Heart size within normal limits. No appreciable airspace
consolidation or pulmonary edema. No evidence of pleural effusion or
pneumothorax. Redemonstrated calcified granuloma within the right
lower lobe. No acute bony abnormality is identified.
IMPRESSION: No evidence of active cardiopulmonary disease.

## 2022-06-08 ENCOUNTER — Other Ambulatory Visit: Payer: Self-pay | Admitting: Family Medicine

## 2022-07-06 ENCOUNTER — Other Ambulatory Visit: Payer: Self-pay | Admitting: Family Medicine

## 2022-07-17 ENCOUNTER — Encounter: Payer: Self-pay | Admitting: Family Medicine

## 2022-07-17 ENCOUNTER — Ambulatory Visit: Payer: No Typology Code available for payment source | Admitting: Family Medicine

## 2022-07-17 ENCOUNTER — Telehealth: Payer: Self-pay | Admitting: Family Medicine

## 2022-07-17 VITALS — BP 140/70 | HR 65 | Temp 97.6°F | Ht 69.0 in | Wt 301.1 lb

## 2022-07-17 DIAGNOSIS — R059 Cough, unspecified: Secondary | ICD-10-CM

## 2022-07-17 DIAGNOSIS — J069 Acute upper respiratory infection, unspecified: Secondary | ICD-10-CM | POA: Diagnosis not present

## 2022-07-17 LAB — POCT RAPID STREP A (OFFICE): Rapid Strep A Screen: NEGATIVE

## 2022-07-17 LAB — POC COVID19 BINAXNOW: SARS Coronavirus 2 Ag: NEGATIVE

## 2022-07-17 MED ORDER — POLYMYXIN B-TRIMETHOPRIM 10000-0.1 UNIT/ML-% OP SOLN: 2.0000 [drp] | OPHTHALMIC | 0 refills | Status: DC

## 2022-07-17 NOTE — Telephone Encounter (Signed)
Patient calling in with respiratory symptoms: Shortness of breath, chest pain, palpitations or other red words send to Triage  Does the patient have a fever over 100, cough, congestion, sore throat, runny nose, lost of taste/smell (please list symptoms that patient has)?cough   What date did symptoms start?07-14-2022 (If over 5 days ago, pt may be scheduled for in person visit)  Have you tested for Covid in the last 5 days? Yes   If yes, was it positive '[x]'$  OR negative '[]'$ ? If positive in the last 5 days, please schedule virtual visit now. If negative, schedule for an in person OV with the next available provider if PCP has no openings. Please also let patient know they will be tested again (follow the script below)  "you will have to arrive 29mns prior to your appt time to be Covid tested. Please park in back of office at the cone & call [phone] let the staff know you have arrived. A staff member will meet you at your car to do a rapid covid test. Once the test has resulted you will be notified by phone of your results to determine if appt will remain an in person visit or be converted to a virtual/phone visit. If you arrive less than 346ms before your appt time, your visit will be automatically converted to virtual & any recommended testing will happen AFTER the visit."  Pt has an appt with dr buSinclair Shipoday 07-17-2022 THINGS TO REMEMBER  If no availability for virtual visit in office,  please schedule another [place] office  If no [facility], please instruct patient that they can schedule an evisit or virtual visit through their mychart account. Visits up to 8pm  patients can be seen in office 5 days after positive COVID test

## 2022-07-17 NOTE — Progress Notes (Signed)
Established Patient Office Visit  Subjective   Patient ID: Hector [name], male    DOB: 1971-07-06  Age: 51 y.o. MRN: [id number]  Chief Complaint  Patient presents with   Eye Drainage    Patient complains of eye drainage, x    Sore Throat    Patient complains of sore throat, x3 days, Tried Mucinex with little relief    HPI   Seen with about 3-day history of intermittent sore throat and some productive cough.  He had right ear ache couple days ago.  No fever.  Developed some drainage from the right eye yesterday.  Had some matting this morning.  No vision changes.  Daughter had recent sore throat symptoms and she has now improved.  He denies any dyspnea.  Past Medical History:  Diagnosis Date   Alcohol abuse    Allergy    Anal fissure    Anxiety    Asthma    Back pain    Bilateral swelling of feet    Blood in stool    Chest pain    Coronary artery disease    Depression    Drug use    Febrile seizure ([place])    GERD (gastroesophageal reflux disease)    Heartburn    Hyperlipidemia    Hypertension    Irritable bowel syndrome with diarrhea    Jaundice, newborn    Palpitations    Panic    Panic disorder    Pre-diabetes    Seizures (HCC)    febrile   Shortness of breath    Sleep apnea    Past Surgical History:  Procedure Laterality Date   CARDIAC CATHETERIZATION     COLONOSCOPY  2005   CORONARY STENT INTERVENTION N/A 08/19/2020   Procedure: CORONARY STENT INTERVENTION;  Surgeon: Jettie Booze, MD;  Location: [facility];  Service: Cardiovascular;  Laterality: N/A;   HEMORRHOID BANDING     INTRAVASCULAR ULTRASOUND/IVUS N/A 08/19/2020   Procedure: Intravascular Ultrasound/IVUS;  Surgeon: Jettie Booze, MD;  Location: [facility];  Service: Cardiovascular;  Laterality: N/A;   LEFT HEART CATH AND CORONARY ANGIOGRAPHY N/A 08/19/2020   Procedure: LEFT HEART CATH AND CORONARY ANGIOGRAPHY;  Surgeon: Jettie Booze, MD;  Location: [facility];  Service: Cardiovascular;  Laterality: N/A;   SIGMOIDOSCOPY     WISDOM TOOTH EXTRACTION  1991    reports that he has never smoked. He has never used smokeless tobacco. He reports current alcohol use. He reports current drug use. Drug: Marijuana. family history includes Basal cell carcinoma in his mother; Cancer in his mother; Colitis in his brother; Colon polyps in his brother and father; Dementia in his mother; Depression in his father and mother; Diabetes in his father; Heart attack in his father; Heart disease (age of onset: 35) in his father; Hyperlipidemia in his father; Hypertension in his father and mother; Obesity in his father; Skin cancer in his mother; Sleep apnea in his father; Squamous cell carcinoma in his mother; Stroke in his father and mother. Allergies  Allergen Reactions   Other Diarrhea    GLP-1RA    Review of Systems  Constitutional:  Negative for chills and fever.  HENT:  Positive for congestion.   Eyes:  Positive for discharge and redness. Negative for blurred vision, double vision and photophobia.  Respiratory:  Positive for cough. Negative for shortness of breath and wheezing.   Cardiovascular:  Negative for chest pain.  Objective:     BP 140/70 (BP Location: Left Arm, Patient Position: Sitting, Cuff Size: Normal)   Pulse 65   Temp 97.6 F (36.4 C) (Oral)   Ht '5\' 9"'$  (1.753 m)   Wt (!) 301 lb 1.6 oz (136.6 kg)   SpO2 97%   BMI 44.46 kg/m    Physical Exam Vitals reviewed.  HENT:     Right Ear: Tympanic membrane normal.     Left Ear: Tympanic membrane normal.     Mouth/Throat:     Mouth: Mucous membranes are moist.     Pharynx: Oropharynx is clear. No oropharyngeal exudate or posterior oropharyngeal erythema.  Eyes:     Comments: Right conjunctive is erythematous.  Small amount of purulent secretion around the inner canthus region.  Otherwise eye exam normal.  Left eye exam reveals normal conjunctive a no drainage  Cardiovascular:     Rate  and Rhythm: Normal rate and regular rhythm.  Pulmonary:     Effort: Pulmonary effort is normal.     Breath sounds: No wheezing or rales.  Musculoskeletal:     Cervical back: Neck supple.  Lymphadenopathy:     Cervical: No cervical adenopathy.  Neurological:     Mental Status: He is alert.      Results for orders placed or performed in visit on 07/17/22  POC COVID-19  Result Value Ref Range   SARS Coronavirus 2 Ag Negative Negative  POC Rapid Strep A  Result Value Ref Range   Rapid Strep A Screen Negative Negative      The ASCVD Risk score (Arnett DK, et al., 2019) failed to calculate for the following reasons:   The valid total cholesterol range is 130 to 320 mg/dL    Assessment & Plan:   #1 probable bacterial conjunctivitis right eye -Continue warm compresses several times daily -Polytrim ophthalmic drops 2 drops right eye every 4 hours while awake and be in touch if not fully clearing in a few days   #2 probable viral URI with cough.  Nonfocal exam.  Rapid strep and COVID testing negative.  Plenty fluids and rest and consider over-the-counter Mucinex   No follow-ups on file.    Carolann Littler, MD

## 2022-07-20 ENCOUNTER — Telehealth: Payer: Self-pay

## 2022-07-20 NOTE — Telephone Encounter (Signed)
Caller states his doctor prescribed him a prescription for pink eye and he now has it in his other eye and he would like a refill. --Caller states he now has pink eye in both eyes. He is using Polymyxin B in both eye and he may need a refill. [facility] [place], [phone]  07/18/2022 10:14:07 AM [facility], RN, Lesa  Care Advice Given Per Guideline HOME CARE: * You should be able to treat this at home. CONTINUE ANTIBIOTIC DROPS: * Continue using the antibiotic eyedrops. EYELID CLEANSING: * Gently wash eyelids and lashes with warm water and wet cotton balls (or cotton gauze). Remove all the dried and liquid pus. * Do this as often as needed. CONTAGIOUSNESS: * [place] is contagious. Try not to touch your eyes. Wash your hands frequently. Do not share towels. * With treatment, the yellow discharge should clear up in 3 days. EXPECTED COURSE: CALL BACK IF: * Pus lasts over 72 hours (3 days) on treatment * Eyelid becomes red or swollen * Fever occurs * You become worse CARE ADVICE given per Eye - Pus or Discharge (Adult) guideline.

## 2022-07-28 ENCOUNTER — Other Ambulatory Visit: Payer: Self-pay | Admitting: Family Medicine

## 2022-08-05 ENCOUNTER — Telehealth: Payer: Self-pay

## 2022-08-05 ENCOUNTER — Encounter (INDEPENDENT_AMBULATORY_CARE_PROVIDER_SITE_OTHER): Payer: Self-pay

## 2022-08-05 NOTE — Telephone Encounter (Signed)
Left a message for the patient to return my call in regards to medication.

## 2022-08-14 ENCOUNTER — Encounter: Payer: Self-pay | Admitting: Family Medicine

## 2022-08-14 ENCOUNTER — Telehealth: Payer: No Typology Code available for payment source | Admitting: Family Medicine

## 2022-08-14 VITALS — Ht 69.0 in | Wt 301.1 lb

## 2022-08-14 DIAGNOSIS — F410 Panic disorder [episodic paroxysmal anxiety] without agoraphobia: Secondary | ICD-10-CM

## 2022-08-14 NOTE — Progress Notes (Signed)
Patient ID: Hector [name], male   DOB: 1971-07-11, 51 y.o.   MRN: [id number]  Virtual Visit via Video Note  I connected with Hector [name] on 08/14/22 at  8:45 AM EDT by a video enabled telemedicine application and verified that I am speaking with the correct person using two identifiers.  Location patient: home Location provider:work or home office Persons participating in the virtual visit: patient, provider  I discussed the limitations of evaluation and management by telemedicine and the availability of in person appointments. The patient expressed understanding and agreed to proceed.   HPI: Hector [name] has history of CAD.  He takes Plavix and also baby aspirin.  He is on Prozac currently low-dose of 10 mg daily.  Past history of depression but also panic disorder.  He was placed on Prozac for panic symptoms.  Currently on very low-dose of 10 as above.  We recently received notification about potential interaction with Prozac and Plavix.  All SSRIs can reduce Plavix efficacy.  Patient denies any recent chest pains or other concerns.  He feels like his anxiety symptoms are stable and he had considered already stopping the Prozac.   ROS: See pertinent positives and negatives per HPI.  Past Medical History:  Diagnosis Date   Alcohol abuse    Allergy    Anal fissure    Anxiety    Asthma    Back pain    Bilateral swelling of feet    Blood in stool    Chest pain    Coronary artery disease    Depression    Drug use    Febrile seizure ([place])    GERD (gastroesophageal reflux disease)    Heartburn    Hyperlipidemia    Hypertension    Irritable bowel syndrome with diarrhea    Jaundice, newborn    Palpitations    Panic    Panic disorder    Pre-diabetes    Seizures (HCC)    febrile   Shortness of breath    Sleep apnea     Past Surgical History:  Procedure Laterality Date   CARDIAC CATHETERIZATION     COLONOSCOPY  2005   CORONARY STENT INTERVENTION N/A 08/19/2020   Procedure: CORONARY  STENT INTERVENTION;  Surgeon: Jettie Booze, MD;  Location: [facility];  Service: Cardiovascular;  Laterality: N/A;   HEMORRHOID BANDING     INTRAVASCULAR ULTRASOUND/IVUS N/A 08/19/2020   Procedure: Intravascular Ultrasound/IVUS;  Surgeon: Jettie Booze, MD;  Location: [place];  Service: Cardiovascular;  Laterality: N/A;   LEFT HEART CATH AND CORONARY ANGIOGRAPHY N/A 08/19/2020   Procedure: LEFT HEART CATH AND CORONARY ANGIOGRAPHY;  Surgeon: Jettie Booze, MD;  Location: [facility];  Service: Cardiovascular;  Laterality: N/A;   SIGMOIDOSCOPY     WISDOM TOOTH EXTRACTION  1991    Family History  Problem Relation Age of Onset   Dementia Mother    Stroke Mother    Hypertension Mother    Skin cancer Mother    Basal cell carcinoma Mother    Squamous cell carcinoma Mother    Cancer Mother    Depression Mother    Diabetes Father    Hyperlipidemia Father    Stroke Father    Heart disease Father 27   Colon polyps Father    Heart attack Father    Hypertension Father    Depression Father    Sleep apnea Father    Obesity Father    Colitis Brother  Colon polyps Brother     SOCIAL HX: Non-smoker   Current Outpatient Medications:    aspirin EC 81 MG tablet, Take 81 mg by mouth daily. Swallow whole., Disp: , Rfl:    atorvastatin (LIPITOR) 80 MG tablet, Take 1 tablet (80 mg total) by mouth daily. (Patient taking differently: Take 40 mg by mouth daily. 1/2 tablet daily), Disp: 90 tablet, Rfl: 3   clopidogrel (PLAVIX) 75 MG tablet, Take 1 tablet (75 mg total) by mouth daily., Disp: 90 tablet, Rfl: 3   LORazepam (ATIVAN) 1 MG tablet, TAKE 1 TABLET (1 MG TOTAL) BY MOUTH EVERY 8 (EIGHT) HOURS AS NEEDED. FOR ANXIETY, Disp: 30 tablet, Rfl: 0   losartan (COZAAR) 50 MG tablet, TAKE 1 TABLET BY MOUTH EVERY DAY, Disp: 90 tablet, Rfl: 0   nebivolol (BYSTOLIC) 10 MG tablet, TAKE 1 TABLET ONCE DAILY, Disp: 90 tablet, Rfl: 1   nitroGLYCERIN (NITROSTAT) 0.4  MG SL tablet, PLACE 1 TABLET UNDER THE TONGUE EVERY 5 MINUTES AS NEEDED FOR CHEST PAIN., Disp: 25 tablet, Rfl: 0   tadalafil (CIALIS) 10 MG tablet, Take 10 mg by mouth daily as needed for erectile dysfunction., Disp: , Rfl:   EXAM:  VITALS per patient if applicable:  GENERAL: alert, oriented, appears well and in no acute distress  HEENT: atraumatic, conjunttiva clear, no obvious abnormalities on inspection of external nose and ears  NECK: normal movements of the head and neck  LUNGS: on inspection no signs of respiratory distress, breathing rate appears normal, no obvious gross SOB, gasping or wheezing  CV: no obvious cyanosis  MS: moves all visible extremities without noticeable abnormality  PSYCH/NEURO: pleasant and cooperative, no obvious depression or anxiety, speech and thought processing grossly intact  ASSESSMENT AND PLAN:  Discussed the following assessment and plan:  History of panic disorder currently stable.  Patient on very low-dose of Prozac and potential interaction with Plavix.  We recommend he stop the Prozac at this time.  No need to taper since this has a long half-life.  -If he has recurrent panic symptoms we will have to look at other potential options other than SSRI if he remains on the Plavix     I discussed the assessment and treatment plan with the patient. The patient was provided an opportunity to ask questions and all were answered. The patient agreed with the plan and demonstrated an understanding of the instructions.   The patient was advised to call back or seek an in-person evaluation if the symptoms worsen or if the condition fails to improve as anticipated.     Carolann Littler, MD

## 2022-10-27 ENCOUNTER — Other Ambulatory Visit: Payer: Self-pay | Admitting: Family Medicine

## 2022-11-03 ENCOUNTER — Encounter: Payer: Self-pay | Admitting: Family Medicine

## 2022-11-05 ENCOUNTER — Other Ambulatory Visit: Payer: Self-pay | Admitting: Interventional Cardiology

## 2022-11-27 ENCOUNTER — Telehealth: Payer: Self-pay | Admitting: Family Medicine

## 2022-11-27 MED ORDER — LORAZEPAM 1 MG PO TABS: 1.0000 mg | ORAL_TABLET | Freq: Three times a day (TID) | ORAL | 0 refills | Status: DC | PRN

## 2022-11-27 NOTE — Telephone Encounter (Signed)
Requesting refill LORazepam (ATIVAN) 1 MG tablet  [id number] [place] [place]: [phone] Fax: [phone]

## 2022-11-29 NOTE — Progress Notes (Unsigned)
Office Visit    Patient Name: Hector [name] Date of Encounter: 11/30/2022  Primary Care Provider:  [name], MD Primary Cardiologist:  Larae Grooms, MD Primary Electrophysiologist: None  Chief Complaint    Hector [name] is a 51 y.o. male with PMH of CAD s/p DES to mid LAD 2021, HTN, HLD, morbid obesity, GERD, sleep apnea who presents today for annual follow-up of coronary artery disease.  Past Medical History    Past Medical History:  Diagnosis Date   Alcohol abuse    Allergy    Anal fissure    Anxiety    Asthma    Back pain    Bilateral swelling of feet    Blood in stool    Chest pain    Coronary artery disease    Depression    Drug use    Febrile seizure ([place])    GERD (gastroesophageal reflux disease)    Heartburn    Hyperlipidemia    Hypertension    Irritable bowel syndrome with diarrhea    Jaundice, newborn    Palpitations    Panic    Panic disorder    Pre-diabetes    Seizures (HCC)    febrile   Shortness of breath    Sleep apnea    Past Surgical History:  Procedure Laterality Date   CARDIAC CATHETERIZATION     COLONOSCOPY  2005   CORONARY STENT INTERVENTION N/A 08/19/2020   Procedure: CORONARY STENT INTERVENTION;  Surgeon: Jettie Booze, MD;  Location: [facility];  Service: Cardiovascular;  Laterality: N/A;   HEMORRHOID BANDING     INTRAVASCULAR ULTRASOUND/IVUS N/A 08/19/2020   Procedure: Intravascular Ultrasound/IVUS;  Surgeon: Jettie Booze, MD;  Location: [facility];  Service: Cardiovascular;  Laterality: N/A;   LEFT HEART CATH AND CORONARY ANGIOGRAPHY N/A 08/19/2020   Procedure: LEFT HEART CATH AND CORONARY ANGIOGRAPHY;  Surgeon: Jettie Booze, MD;  Location: [facility];  Service: Cardiovascular;  Laterality: N/A;   SIGMOIDOSCOPY     WISDOM TOOTH EXTRACTION  1991    Allergies  Allergies  Allergen Reactions   Other Diarrhea    GLP-1RA    History of Present Illness    Hector [name]   is a 51 year old male with the above mention past medical history who presents today for annual follow-up of coronary artery disease.  Mr. Lowrey was initially seen in 2017 by Dr. Irish Lack for complaint of chest pain at the request of his PCP.  Patient described chest pain as pressure that occurred with stress and anger and travel to bilateral arms.  EKG was performed that showed T wave inversions in leads V2-V3 and patient underwent echo stress that was normal.  Risk factor modification was recommended at that time.  He was seen in 2019 with ongoing chest discomfort that would occur 2 minutes at a time and ease with deep breathing.  Coronary CTA was ordered that showed less than 50% LAD and calcium score of 70 with recommendations to add Lipitor 40 mg once daily for primary prevention.  Heart cath was discussed at his next follow-up but patient elected to defer.  In 2021 patient had chest pain that brought him to ED that was intermittent and worse at night with certain positions.  Cardiac catheterization was then completed that revealed 95% mid LAD lesion treated with DES x 1 with 30% stenosed RCA and EF of 55-65% with proximal LAD lesion of 20%.  He was started on Brilinta and ASA 81 mg.  He developed some shortness of breath following procedure and was switched to clopidogrel 75 mg following year with plan to possibly stop ASA with monotherapy of clopidogrel only.  Atorvastatin was also increased to 80 mg for secondary prevention.  Compression stockings and diuretic was recommended for lower extremity edema.  Mr. Urton presents today for 1 year follow-up alone.  Since last being seen in the office patient reports that he has no experienced any chest pain but has noted increased shortness of breath and lower extremity edema.  He reports compliance with his current medication regimen and denies any adverse reactions or side effects.  His blood pressure today was also elevated at 154/100 and was 134/92 on recheck  with heart rate of 58 bpm.  During our visit patient admitted to major sodium indiscretions and sedentary lifestyle.  We also further discussed the pathophysiology of coronary artery disease and congestive heart failure.  I explained in detail the importance of secondary prevention and preventing progression of coronary artery disease.  Patient denies chest pain, palpitations, dyspnea, PND, orthopnea, nausea, vomiting, dizziness, syncope, edema, weight gain, or early satiety.   Home Medications    Current Outpatient Medications  Medication Sig Dispense Refill   aspirin EC 81 MG tablet Take 81 mg by mouth daily. Swallow whole.     atorvastatin (LIPITOR) 40 MG tablet Take 40 mg by mouth daily.     clopidogrel (PLAVIX) 75 MG tablet Take 1 tablet (75 mg total) by mouth daily. Pt.need to make appt.with Cardiologist in order to receive future refills. Thank You. 90 tablet 0   furosemide (LASIX) 20 MG tablet Take 1 tablet (20 mg total) by mouth as directed. 30 tablet 1   LORazepam (ATIVAN) 1 MG tablet Take 1 tablet (1 mg total) by mouth every 8 (eight) hours as needed. for anxiety 30 tablet 0   losartan (COZAAR) 50 MG tablet TAKE 1 TABLET BY MOUTH EVERY DAY 90 tablet 0   nebivolol (BYSTOLIC) 10 MG tablet TAKE 1 TABLET ONCE DAILY 90 tablet 1   nitroGLYCERIN (NITROSTAT) 0.4 MG SL tablet PLACE 1 TABLET UNDER THE TONGUE EVERY 5 MINUTES AS NEEDED FOR CHEST PAIN. 25 tablet 0   potassium chloride SA (KLOR-CON M20) 20 MEQ tablet Take 1 tablet (20 mEq total) by mouth daily. 30 tablet 0   tadalafil (CIALIS) 10 MG tablet Take 10 mg by mouth daily as needed for erectile dysfunction.     No current facility-administered medications for this visit.     Review of Systems  Please see the history of present illness.    (+) Lower extremity edema (+) Shortness of breath  All other systems reviewed and are otherwise negative except as noted above.  Physical Exam    Wt Readings from Last 3 Encounters:  11/30/22  (!) 309 lb (140.2 kg)  08/14/22 (!) 301 lb 1.6 oz (136.6 kg)  07/17/22 (!) 301 lb 1.6 oz (136.6 kg)   VS: Vitals:   11/30/22 1608 11/30/22 1722  BP: (!) 154/100 (!) 134/92  Pulse: (!) 58   SpO2: 98%   ,Body mass index is 45.63 kg/m.  Constitutional:      Appearance: Obese healthy. Not in distress.  Neck:     Vascular: JVD normal.  Pulmonary:     Effort: Pulmonary effort is normal.     Breath sounds: No wheezing. No rales. Diminished in the bases Cardiovascular:     Normal rate. Regular rhythm.  Normal S1. Normal S2.      Murmurs: There is no murmur.  Edema:    +2 lower extremity edema Abdominal:     Palpations: Abdomen is soft non tender. There is no hepatomegaly.  Skin:    General: Skin is warm and dry.  Neurological:     General: No focal deficit present.     Mental Status: Alert and oriented to person, place and time.     Cranial Nerves: Cranial nerves are intact.   EKG/LABS/Other Studies Reviewed    ECG personally reviewed by me today -sinus bradycardia with rate of 58 bpm and no acute changes consistent with previous EKG and flattened T waves in leads V4 and lead III.    Lab Results  Component Value Date   WBC 8.3 09/03/2021   HGB 14.6 09/03/2021   HCT 42.8 09/03/2021   MCV 90 09/03/2021   PLT 158 09/03/2021   Lab Results  Component Value Date   CREATININE 1.07 09/03/2021   BUN 19 09/03/2021   NA 143 09/03/2021   K 4.1 09/03/2021   CL 106 09/03/2021   CO2 23 09/03/2021   Lab Results  Component Value Date   ALT 18 09/03/2021   AST 19 09/03/2021   ALKPHOS 96 09/03/2021   BILITOT 0.4 09/03/2021   Lab Results  Component Value Date   CHOL 126 09/03/2021   HDL 35 (L) 09/03/2021   LDLCALC 69 09/03/2021   TRIG 122 09/03/2021   CHOLHDL 3.6 09/03/2021    Lab Results  Component Value Date   HGBA1C 5.3 09/03/2021    Assessment & Plan    1.  Coronary artery disease: -s/p LHC 2021 with 95% LAD treated with DES x 1.  Today patient reports no  recurrent chest pain or angina. -Continue GDMT with Lipitor 80 mg, Plavix 75 mg, ASA 81 mg, nebivolol 10 mg -He reports increased shortness of breath with exertion -Repeat echocardiogram to rule out possible structural heart and valvular changes -We will order exercise Myoview to evaluate for possible ischemia related to shortness of breath  2.  Essential hypertension: -HYPERTENSION CONTROL Vitals:   11/30/22 1608 11/30/22 1722  BP: (!) 154/100 (!) 134/92    The patient's blood pressure is elevated above target today.  In order to address the patient's elevated BP: Blood pressure will be monitored at home to determine if medication changes need to be made.; Follow up with general cardiology has been recommended.      -Continue losartan 50 mg and Bystolic 10 mg  3.  Hyperlipidemia: -Patient's last LDL cholesterol was 67 in 2022 -Patient reports compliance continue atorvastatin 80 mg daily  4.  Morbid obesity: -Patient's BMI is 45.63 kg/m  -Continue risk factor modification with primary prevention of exercise 150 minutes every week  Disposition: Follow-up with Larae Grooms, MD or APP in 4 weeks Shared Decision Making/Informed Consent The risks [chest pain, shortness of breath, cardiac arrhythmias, dizziness, blood pressure fluctuations, myocardial infarction, stroke/transient ischemic attack, nausea, vomiting, allergic reaction, radiation exposure, metallic taste sensation and life-threatening complications (estimated to be 1 in 10,000)], benefits (risk stratification, diagnosing coronary artery disease, treatment guidance) and alternatives of a nuclear stress test were discussed in detail with Mr. Jalomo and he agrees to proceed.   Medication Adjustments/Labs and Tests Ordered: Current medicines are reviewed at length with the patient today.  Concerns regarding medicines are outlined above.   Signed, Mable Fill, Marissa Nestle, NP 11/30/2022, 5:23 PM [place]  Note:  This document was prepared using Dragon voice recognition software and may include unintentional dictation errors.

## 2022-11-30 ENCOUNTER — Encounter: Payer: Self-pay | Admitting: Nurse Practitioner

## 2022-11-30 ENCOUNTER — Ambulatory Visit: Payer: No Typology Code available for payment source | Attending: Nurse Practitioner | Admitting: Nurse Practitioner

## 2022-11-30 VITALS — BP 134/92 | HR 58 | Ht 69.0 in | Wt 309.0 lb

## 2022-11-30 DIAGNOSIS — R0602 Shortness of breath: Secondary | ICD-10-CM

## 2022-11-30 DIAGNOSIS — I2510 Atherosclerotic heart disease of native coronary artery without angina pectoris: Secondary | ICD-10-CM | POA: Diagnosis not present

## 2022-11-30 DIAGNOSIS — I10 Essential (primary) hypertension: Secondary | ICD-10-CM

## 2022-11-30 DIAGNOSIS — E7800 Pure hypercholesterolemia, unspecified: Secondary | ICD-10-CM

## 2022-11-30 DIAGNOSIS — R600 Localized edema: Secondary | ICD-10-CM | POA: Diagnosis not present

## 2022-11-30 DIAGNOSIS — Z6839 Body mass index (BMI) 39.0-39.9, adult: Secondary | ICD-10-CM

## 2022-11-30 MED ORDER — POTASSIUM CHLORIDE CRYS ER 20 MEQ PO TBCR: 20.0000 meq | EXTENDED_RELEASE_TABLET | Freq: Every day | ORAL | 0 refills | Status: DC

## 2022-11-30 MED ORDER — FUROSEMIDE 20 MG PO TABS: 20.0000 mg | ORAL_TABLET | ORAL | 1 refills | Status: DC

## 2022-11-30 NOTE — Patient Instructions (Addendum)
Medication Instructions:  START Lasix '20mg'$  Take 1 twice a day for 3 days then take 1 tablet daily for a week then STOP on Tuesday (12/08/22) START Potassium 85mq take 1 tablet daily for 1 week then STOP on Tuesday (12/08/22) *If you need a refill on your cardiac medications before your next appointment, please call your pharmacy*   Lab Work: 1 week BMET & MAG If you have labs (blood work) drawn today and your tests are completely normal, you will receive your results only by: [place](if you have [place]Chart) OR A paper copy in the mail If you have any lab test that is abnormal or we need to change your treatment, we will call you to review the results.   Testing/Procedures: Your physician has requested that you have an echocardiogram. Echocardiography is a painless test that uses sound waves to create images of your heart. It provides your doctor with information about the size and shape of your heart and how well your heart's chambers and valves are working. This procedure takes approximately one hour. There are no restrictions for this procedure. Please do NOT wear cologne, perfume, aftershave, or lotions (deodorant is allowed). Please arrive 15 minutes prior to your appointment time.  Your physician has requested that you have an exercise stress myoview. For further information please visit [email or web address] Please follow instruction sheet, as given.   Follow-Up: [facility] and your [facility] needs are our priority.  As part of our continuing mission to provide you with exceptional heart care, we have created designated Provider Care Teams.  These Care Teams include your primary Cardiologist (physician) and Advanced Practice Providers (APPs -  Physician Assistants and Nurse Practitioners) who all work together to provide you with the care you need, when you need it.  We recommend signing up for the patient portal called "MyChart".  Sign up information is provided  on this After Visit Summary.  MyChart is used to connect with patients for Virtual Visits (Telemedicine).  Patients are able to view lab/test results, encounter notes, upcoming appointments, etc.  Non-urgent messages can be sent to your provider as well.   To learn more about what you can do with MyChart, go to [email or web address]    Your next appointment:   4 week(s)  The format for your next appointment:   In Person  Provider:   EAmbrose Pancoast NP  Other Instructions Check your blood pressure 2 times a day and record the readings. Please contact the office in 2 weeks with readings  Important Information About Sugar      4

## 2022-12-02 MED ORDER — POTASSIUM CHLORIDE CRYS ER 20 MEQ PO TBCR: 20.0000 meq | EXTENDED_RELEASE_TABLET | Freq: Every day | ORAL | 0 refills | Status: DC

## 2022-12-02 MED ORDER — FUROSEMIDE 20 MG PO TABS: 20.0000 mg | ORAL_TABLET | ORAL | 0 refills | Status: DC

## 2022-12-02 NOTE — Addendum Note (Signed)
Addended by: Bernestine Amass on: 12/02/2022 09:13 AM   Modules accepted: Orders

## 2022-12-05 ENCOUNTER — Other Ambulatory Visit: Payer: Self-pay | Admitting: Family Medicine

## 2022-12-07 ENCOUNTER — Telehealth (HOSPITAL_COMMUNITY): Payer: Self-pay

## 2022-12-07 NOTE — Telephone Encounter (Signed)
Spoke with the patient, detailed instructions given. He stated that he would be here. Asked to call back with any questions. S.Lyllie Cobbins EMTP

## 2022-12-09 ENCOUNTER — Ambulatory Visit: Payer: No Typology Code available for payment source | Attending: Nurse Practitioner

## 2022-12-09 DIAGNOSIS — I2510 Atherosclerotic heart disease of native coronary artery without angina pectoris: Secondary | ICD-10-CM

## 2022-12-09 DIAGNOSIS — R600 Localized edema: Secondary | ICD-10-CM

## 2022-12-09 DIAGNOSIS — R0602 Shortness of breath: Secondary | ICD-10-CM

## 2022-12-10 LAB — BASIC METABOLIC PANEL
BUN/Creatinine Ratio: 17 (ref 9–20)
BUN: 20 mg/dL (ref 6–24)
CO2: 25 mmol/L (ref 20–29)
Calcium: 8.8 mg/dL (ref 8.7–10.2)
Chloride: 104 mmol/L (ref 96–106)
Creatinine, Ser: 1.19 mg/dL (ref 0.76–1.27)
Glucose: 113 mg/dL — ABNORMAL HIGH (ref 70–99)
Potassium: 4.3 mmol/L (ref 3.5–5.2)
Sodium: 141 mmol/L (ref 134–144)
eGFR: 74 mL/min/{1.73_m2} (ref >59)

## 2022-12-15 ENCOUNTER — Ambulatory Visit (HOSPITAL_COMMUNITY): Payer: No Typology Code available for payment source | Attending: Nurse Practitioner

## 2022-12-15 DIAGNOSIS — I2510 Atherosclerotic heart disease of native coronary artery without angina pectoris: Secondary | ICD-10-CM

## 2022-12-15 DIAGNOSIS — R0602 Shortness of breath: Secondary | ICD-10-CM | POA: Diagnosis present

## 2022-12-15 DIAGNOSIS — R600 Localized edema: Secondary | ICD-10-CM

## 2022-12-15 MED ORDER — TECHNETIUM TC 99M TETROFOSMIN IV KIT
32.5000 | PACK | Freq: Once | INTRAVENOUS | Status: AC | PRN
  Administered 2022-12-15: 32.5 via INTRAVENOUS

## 2022-12-16 ENCOUNTER — Ambulatory Visit (HOSPITAL_COMMUNITY): Payer: No Typology Code available for payment source

## 2022-12-16 LAB — MYOCARDIAL PERFUSION IMAGING
Angina Index: 0
Duke Treadmill Score: 7
Estimated workload: 8.9
Exercise duration (min): 7 min
Exercise duration (sec): 16 s
LV dias vol: 78 mL (ref 62–150)
LV sys vol: 33 mL
MPHR: 169 {beats}/min
Nuc Stress EF: 58 %
Peak HR: 148 {beats}/min
Percent HR: 88 %
RPE: 19
Rest HR: 71 {beats}/min
Rest Nuclear Isotope Dose: 31.2 mCi
SDS: 2
SRS: 0
SSS: 2
ST Depression (mm): 0 mm
Stress Nuclear Isotope Dose: 32.5 mCi
TID: 0.75

## 2022-12-16 MED ORDER — TECHNETIUM TC 99M TETROFOSMIN IV KIT
31.2000 | PACK | Freq: Once | INTRAVENOUS | Status: AC | PRN
  Administered 2022-12-16: 31.2 via INTRAVENOUS

## 2022-12-17 ENCOUNTER — Telehealth: Payer: Self-pay

## 2022-12-17 NOTE — Telephone Encounter (Signed)
Spoke with patient to review stress test results as reviewed by Ambrose Pancoast, NP.  Patient had questions regarding swelling in lower extremities. For his job he sits a lot. Provided education on lower extremity edema and measures to reduce swelling such as support hose/socks and avoiding high sodium foods. Also advised to take frequent short walk breaks while working and consider getting a standing desk. Patient verbalized understanding.

## 2022-12-24 ENCOUNTER — Other Ambulatory Visit: Payer: Self-pay | Admitting: Nurse Practitioner

## 2022-12-24 NOTE — Telephone Encounter (Signed)
Pt's pharmacy is requesting a refill on Potassium Chloride and Furosemide. LOV on 11/30/22 with Rebekah Chesterfield., NP, pt was suppose to d/c both medications on 12/08/22. Can these medications be discontinued off medication list? Please advise.

## 2022-12-25 ENCOUNTER — Ambulatory Visit (HOSPITAL_COMMUNITY): Payer: No Typology Code available for payment source | Attending: Nurse Practitioner

## 2022-12-25 DIAGNOSIS — I2510 Atherosclerotic heart disease of native coronary artery without angina pectoris: Secondary | ICD-10-CM | POA: Insufficient documentation

## 2022-12-25 DIAGNOSIS — R0602 Shortness of breath: Secondary | ICD-10-CM | POA: Diagnosis not present

## 2022-12-25 DIAGNOSIS — R600 Localized edema: Secondary | ICD-10-CM | POA: Insufficient documentation

## 2022-12-25 LAB — ECHOCARDIOGRAM COMPLETE
Area-P 1/2: 2.73 cm2
S' Lateral: 2.9 cm

## 2022-12-25 MED ORDER — PERFLUTREN LIPID MICROSPHERE
1.0000 mL | INTRAVENOUS | Status: AC | PRN
  Administered 2022-12-25: 2 mL via INTRAVENOUS

## 2023-01-07 NOTE — Progress Notes (Signed)
Office Visit    Patient Name: Hector [name] Date of Encounter: 01/08/2023  Primary Care Provider:  [name], MD Primary Cardiologist:  Larae Grooms, MD Primary Electrophysiologist: None  Chief Complaint    Hector [name] is a 52 y.o. male with PMH of CAD s/p DES to mid LAD 2021, HTN, HLD, morbid obesity, GERD, sleep apnea who presents today for annual follow-up of coronary artery disease.   Past Medical History    Past Medical History:  Diagnosis Date   Alcohol abuse    Allergy    Anal fissure    Anxiety    Asthma    Back pain    Bilateral swelling of feet    Blood in stool    Chest pain    Coronary artery disease    Depression    Drug use    Febrile seizure ([place])    GERD (gastroesophageal reflux disease)    Heartburn    Hyperlipidemia    Hypertension    Irritable bowel syndrome with diarrhea    Jaundice, newborn    Palpitations    Panic    Panic disorder    Pre-diabetes    Seizures ([facility])    febrile   Shortness of breath    Sleep apnea    Past Surgical History:  Procedure Laterality Date   CARDIAC CATHETERIZATION     COLONOSCOPY  2005   CORONARY STENT INTERVENTION N/A 08/19/2020   Procedure: CORONARY STENT INTERVENTION;  Surgeon: Jettie Booze, MD;  Location: [facility];  Service: Cardiovascular;  Laterality: N/A;   HEMORRHOID BANDING     INTRAVASCULAR ULTRASOUND/IVUS N/A 08/19/2020   Procedure: Intravascular Ultrasound/IVUS;  Surgeon: Jettie Booze, MD;  Location: [facility];  Service: Cardiovascular;  Laterality: N/A;   LEFT HEART CATH AND CORONARY ANGIOGRAPHY N/A 08/19/2020   Procedure: LEFT HEART CATH AND CORONARY ANGIOGRAPHY;  Surgeon: Jettie Booze, MD;  Location: [place] [facility];  Service: Cardiovascular;  Laterality: N/A;   SIGMOIDOSCOPY     WISDOM TOOTH EXTRACTION  1991    Allergies  Allergies  Allergen Reactions   Other Diarrhea    GLP-1RA    History of Present Illness    Hector [name]   is a 52 year old male with the above mention past medical history who presents today for annual follow-up of coronary artery disease.  Hector [name] was initially seen in 2017 by Dr. Irish Lack for complaint of chest pain at the request of his PCP.  Patient described chest pain as pressure that occurred with stress and anger and travel to bilateral arms.  EKG was performed that showed T wave inversions in leads V2-V3 and patient underwent echo stress that was normal.  Risk factor modification was recommended at that time.  He was seen in 2019 with ongoing chest discomfort that would occur 2 minutes at a time and ease with deep breathing.  Coronary CTA was ordered that showed less than 50% LAD and calcium score of 70 with recommendations to add Lipitor 40 mg once daily for primary prevention.  Heart cath was discussed at his next follow-up but patient elected to defer.  In 2021 patient had chest pain that brought him to ED that was intermittent and worse at night with certain positions.  Cardiac catheterization was then completed that revealed 95% mid LAD lesion treated with DES x 1 with 30% stenosed RCA and EF of 55-65% with proximal LAD lesion of 20%.  He was started on Brilinta and ASA 81 mg.  He developed some shortness of breath following procedure and was switched to clopidogrel 75 mg following year with plan to possibly stop ASA with monotherapy of clopidogrel only.  Atorvastatin was also increased to 80 mg for secondary prevention.  Compression stockings and diuretic was recommended for lower extremity edema.  He was seen on 11/30/2022 for 1 year follow-up.  During visit patient reported increased shortness of breath and lower extremity edema.  His blood pressures were elevated as well at 154/101 34/92 on recheck.   Hector [name] presents today for 1 year follow-up alone.  Since last being seen in the office patient reports that he has no experienced any chest pain but has noted increased shortness of breath and  lower extremity edema.  He reports compliance with his current medication regimen and denies any adverse reactions or side effects.  His blood pressure today was also elevated at 154/100 and was 134/92 on recheck with heart rate of 58 bpm.  During our visit patient admitted to major sodium indiscretions and sedentary lifestyle.  We also further discussed the pathophysiology of coronary artery disease and congestive heart failure.  I explained in detail the importance of secondary prevention and preventing progression of coronary artery disease.  Patient denies chest pain, palpitations, dyspnea, PND, orthopnea, nausea, vomiting, dizziness, syncope, edema, weight gain, or early satiety.  2D echo was repeated to rule out possible structural and valvular changes and exercise Myoview was also ordered.  Echo showed normal EF of 65-70% with grade 1 DD and no valvular abnormalities.  The results of his exercise Myoview were normal with no evidence of ischemia.  He was instructed to monitor blood pressures at home with no medication changes were made at that time.  Hector [name] presents today for 1 month follow-up of CAD and lower extremity edema.  Since last being seen in the office patient reports that he has been doing well with no new cardiac complaints.  He reports compliance with his current medication regimen and denies any adverse reactions.  His blood pressures today are well-controlled at 120/80 and heart rate was 67 bpm.  During our visit we reviewed his findings of his 2D echo and stress test.  He reports that he is currently not very active and does complain of shortness of breath without any activity.  He is currently overweight and is working on making good dietary choices.  He is recently cut back on sugar and is eating less processed foods.  He seems very motivated to change at this time and we discussed further strategies for increasing physical activities.  Patient denies chest pain, palpitations, dyspnea,  PND, orthopnea, nausea, vomiting, dizziness, syncope, edema, weight gain, or early satiety.   Home Medications    Current Outpatient Medications  Medication Sig Dispense Refill   aspirin EC 81 MG tablet Take 81 mg by mouth daily. Swallow whole.     atorvastatin (LIPITOR) 40 MG tablet Take 40 mg by mouth daily.     clopidogrel (PLAVIX) 75 MG tablet Take 1 tablet (75 mg total) by mouth daily. Pt.need to make appt.with Cardiologist in order to receive future refills. Thank You. 90 tablet 0   LORazepam (ATIVAN) 1 MG tablet Take 1 tablet (1 mg total) by mouth every 8 (eight) hours as needed. for anxiety 30 tablet 0   losartan (COZAAR) 50 MG tablet TAKE 1 TABLET BY MOUTH EVERY DAY 90 tablet 0   nebivolol (BYSTOLIC) 10 MG  tablet TAKE 1 TABLET ONCE DAILY 90 tablet 0   nitroGLYCERIN (NITROSTAT) 0.4 MG SL tablet PLACE 1 TABLET UNDER THE TONGUE EVERY 5 MINUTES AS NEEDED FOR CHEST PAIN. 25 tablet 0   tadalafil (CIALIS) 10 MG tablet Take 10 mg by mouth daily as needed for erectile dysfunction.     No current facility-administered medications for this visit.     Review of Systems  Please see the history of present illness.    (+) Lower extremity edema (+) Shortness of breath with exertion  All other systems reviewed and are otherwise negative except as noted above.  Physical Exam    Wt Readings from Last 3 Encounters:  01/08/23 (!) 306 lb (138.8 kg)  12/15/22 (!) 309 lb (140.2 kg)  11/30/22 (!) 309 lb (140.2 kg)   VS: Vitals:   01/08/23 1114  BP: 120/80  Pulse: 67  SpO2: 97%  ,Body mass index is 45.19 kg/m.  Constitutional:      Appearance: Healthy appearance. Not in distress.  Neck:     Vascular: JVD normal.  Pulmonary:     Effort: Pulmonary effort is normal.     Breath sounds: No wheezing. No rales. Diminished in the bases Cardiovascular:     Normal rate. Regular rhythm. Normal S1. Normal S2.      Murmurs: There is no murmur.  Edema:    Peripheral edema absent.  Abdominal:      Palpations: Abdomen is soft non tender. There is no hepatomegaly.  Skin:    General: Skin is warm and dry.  Neurological:     General: No focal deficit present.     Mental Status: Alert and oriented to person, place and time.     Cranial Nerves: Cranial nerves are intact.  EKG/LABS/Other Studies Reviewed    ECG personally reviewed by me today -none completed today     Lab Results  Component Value Date   WBC 8.3 09/03/2021   HGB 14.6 09/03/2021   HCT 42.8 09/03/2021   MCV 90 09/03/2021   PLT 158 09/03/2021   Lab Results  Component Value Date   CREATININE 1.19 12/09/2022   BUN 20 12/09/2022   NA 141 12/09/2022   K 4.3 12/09/2022   CL 104 12/09/2022   CO2 25 12/09/2022   Lab Results  Component Value Date   ALT 18 09/03/2021   AST 19 09/03/2021   ALKPHOS 96 09/03/2021   BILITOT 0.4 09/03/2021   Lab Results  Component Value Date   CHOL 126 09/03/2021   HDL 35 (L) 09/03/2021   LDLCALC 69 09/03/2021   TRIG 122 09/03/2021   CHOLHDL 3.6 09/03/2021    Lab Results  Component Value Date   HGBA1C 5.3 09/03/2021    Assessment & Plan    1.  Coronary artery disease: -s/p LHC 2021 with 95% LAD treated with DES x 1.  Today patient reports no recurrent chest pain or angina. -Continue GDMT with Lipitor 80 mg, Plavix 75 mg, ASA 81 mg, nebivolol 10 mg -He reports increased shortness of breath with exertion -We discussed the importance of increasing physical activity and also abstaining from excess sodium and calories in his diet. -We will [facility]  2.  Essential hypertension: -Patient's blood pressure today was well-controlled at 120/80 -Continue losartan 50 mg and Bystolic 10 mg   3.  Hyperlipidemia: -Patient's last LDL cholesterol was 67 in 2022 -Patient reports compliance continue atorvastatin 80 mg daily   4.  Morbid  obesity: -Patient's BMI is 45.19 kg/m  -Continue risk factor modification with primary prevention of exercise 150 minutes  every week  Disposition: Follow-up with Larae Grooms, MD or APP in 6 months   Medication Adjustments/Labs and Tests Ordered: Current medicines are reviewed at length with the patient today.  Concerns regarding medicines are outlined above.   Signed, Mable Fill, Marissa Nestle, NP 01/08/2023, 11:49 AM [facility]  Note:  This document was prepared using Dragon voice recognition software and may include unintentional dictation errors.

## 2023-01-08 ENCOUNTER — Ambulatory Visit: Payer: No Typology Code available for payment source | Attending: Nurse Practitioner | Admitting: Nurse Practitioner

## 2023-01-08 ENCOUNTER — Encounter: Payer: Self-pay | Admitting: Nurse Practitioner

## 2023-01-08 VITALS — BP 120/80 | HR 67 | Ht 69.0 in | Wt 306.0 lb

## 2023-01-08 DIAGNOSIS — I10 Essential (primary) hypertension: Secondary | ICD-10-CM | POA: Diagnosis not present

## 2023-01-08 DIAGNOSIS — E782 Mixed hyperlipidemia: Secondary | ICD-10-CM

## 2023-01-08 DIAGNOSIS — I2510 Atherosclerotic heart disease of native coronary artery without angina pectoris: Secondary | ICD-10-CM | POA: Diagnosis not present

## 2023-01-08 MED ORDER — FUROSEMIDE 20 MG PO TABS: 20.0000 mg | ORAL_TABLET | Freq: Every day | ORAL | 1 refills | Status: DC

## 2023-01-08 NOTE — Patient Instructions (Signed)
Medication Instructions:  START Lasix '20mg'$  Take 1 tablet once a day  *If you need a refill on your cardiac medications before your next appointment, please call your pharmacy*   Lab Work: 2 weeks BMET If you have labs (blood work) drawn today and your tests are completely normal, you will receive your results only by: [place] (if you have MyChart) OR A paper copy in the mail If you have any lab test that is abnormal or we need to change your treatment, we will call you to review the results.   Testing/Procedures: NONE ORDERED   Follow-Up: At [facility], you and your health needs are our priority.  As part of our continuing mission to provide you with exceptional heart care, we have created designated Provider Care Teams.  These Care Teams include your primary Cardiologist (physician) and Advanced Practice Providers (APPs -  Physician Assistants and Nurse Practitioners) who all work together to provide you with the care you need, when you need it.  We recommend signing up for the patient portal called "MyChart".  Sign up information is provided on this After Visit Summary.  MyChart is used to connect with patients for Virtual Visits (Telemedicine).  Patients are able to view lab/test results, encounter notes, upcoming appointments, etc.  Non-urgent messages can be sent to your provider as well.   To learn more about what you can do with MyChart, go to [email or web address].    Your next appointment:   6 month(s)  Provider:   Ambrose Pancoast, NP       Other Instructions

## 2023-01-22 ENCOUNTER — Other Ambulatory Visit: Payer: No Typology Code available for payment source

## 2023-02-08 ENCOUNTER — Other Ambulatory Visit: Payer: Self-pay | Admitting: Interventional Cardiology

## 2023-02-12 ENCOUNTER — Other Ambulatory Visit: Payer: Self-pay | Admitting: Family Medicine

## 2023-02-22 ENCOUNTER — Telehealth: Payer: Self-pay | Admitting: Family Medicine

## 2023-02-22 MED ORDER — LOSARTAN POTASSIUM 50 MG PO TABS: 50.0000 mg | ORAL_TABLET | Freq: Every day | ORAL | 0 refills | Status: DC

## 2023-02-22 NOTE — Telephone Encounter (Signed)
Pt needs a 30 or 90 day refill on Losartan 50 mg [facility].  He has a physical appointment on 03/17/23.

## 2023-02-22 NOTE — Telephone Encounter (Signed)
Rx sent 

## 2023-02-24 ENCOUNTER — Other Ambulatory Visit: Payer: Self-pay | Admitting: Family Medicine

## 2023-02-24 NOTE — Telephone Encounter (Signed)
Pt states he has 6 days worth of Lipitor/Atorvastatin 40 mg left.  He needs a refill sent to his pharmacy.  [facility] [address] [facility].

## 2023-02-26 MED ORDER — ATORVASTATIN CALCIUM 40 MG PO TABS: 40.0000 mg | ORAL_TABLET | Freq: Every day | ORAL | 0 refills | Status: DC

## 2023-03-05 ENCOUNTER — Other Ambulatory Visit: Payer: Self-pay | Admitting: Family Medicine

## 2023-03-05 ENCOUNTER — Telehealth: Payer: Self-pay | Admitting: Family Medicine

## 2023-03-05 MED ORDER — ATORVASTATIN CALCIUM 40 MG PO TABS: 40.0000 mg | ORAL_TABLET | Freq: Every day | ORAL | 0 refills | Status: DC

## 2023-03-05 NOTE — Telephone Encounter (Signed)
Pt states that the refill that was called in to [facility] was cancelled by [place].  He has been out of Lipitor for 4-5 days and needs a 30 day supply called [facility].  He is going out of town next week so states he needs to see if he can get it today.

## 2023-03-05 NOTE — Telephone Encounter (Signed)
Rx sent 

## 2023-03-17 ENCOUNTER — Ambulatory Visit (INDEPENDENT_AMBULATORY_CARE_PROVIDER_SITE_OTHER): Payer: No Typology Code available for payment source | Admitting: Family Medicine

## 2023-03-17 ENCOUNTER — Encounter: Payer: Self-pay | Admitting: Family Medicine

## 2023-03-17 VITALS — BP 144/92 | HR 64 | Temp 97.5°F | Ht 69.69 in | Wt 312.5 lb

## 2023-03-17 DIAGNOSIS — Z0000 Encounter for general adult medical examination without abnormal findings: Secondary | ICD-10-CM

## 2023-03-17 DIAGNOSIS — E785 Hyperlipidemia, unspecified: Secondary | ICD-10-CM

## 2023-03-17 LAB — CBC WITH DIFFERENTIAL/PLATELET
Basophils Absolute: 0 10*3/uL (ref 0.0–0.1)
Basophils Relative: 0.4 % (ref 0.0–3.0)
Eosinophils Absolute: 0.2 10*3/uL (ref 0.0–0.7)
Eosinophils Relative: 3.7 % (ref 0.0–5.0)
HCT: 44.2 % (ref 39.0–52.0)
Hemoglobin: 15.4 g/dL (ref 13.0–17.0)
Lymphocytes Relative: 22.9 % (ref 12.0–46.0)
Lymphs Abs: 1.4 10*3/uL (ref 0.7–4.0)
MCHC: 34.9 g/dL (ref 30.0–36.0)
MCV: 87.5 fl (ref 78.0–100.0)
Monocytes Absolute: 0.4 10*3/uL (ref 0.1–1.0)
Monocytes Relative: 7.2 % (ref 3.0–12.0)
Neutro Abs: 4 10*3/uL (ref 1.4–7.7)
Neutrophils Relative %: 65.8 % (ref 43.0–77.0)
Platelets: 173 10*3/uL (ref 150.0–400.0)
RBC: 5.05 Mil/uL (ref 4.22–5.81)
RDW: 13.1 % (ref 11.5–15.5)
WBC: 6.1 10*3/uL (ref 4.0–10.5)

## 2023-03-17 LAB — LIPID PANEL
Cholesterol: 124 mg/dL (ref 0–200)
HDL: 36.6 mg/dL — ABNORMAL LOW (ref >39.00)
LDL Cholesterol: 66 mg/dL (ref 0–99)
NonHDL: 87.01
Total CHOL/HDL Ratio: 3
Triglycerides: 104 mg/dL (ref 0.0–149.0)
VLDL: 20.8 mg/dL (ref 0.0–40.0)

## 2023-03-17 LAB — BASIC METABOLIC PANEL
BUN: 23 mg/dL (ref 6–23)
CO2: 22 mEq/L (ref 19–32)
Calcium: 9 mg/dL (ref 8.4–10.5)
Chloride: 107 mEq/L (ref 96–112)
Creatinine, Ser: 1.03 mg/dL (ref 0.40–1.50)
GFR: 84.16 mL/min (ref >60.00)
Glucose, Bld: 106 mg/dL — ABNORMAL HIGH (ref 70–99)
Potassium: 3.9 mEq/L (ref 3.5–5.1)
Sodium: 139 mEq/L (ref 135–145)

## 2023-03-17 LAB — HEPATIC FUNCTION PANEL
ALT: 30 U/L (ref 0–53)
AST: 25 U/L (ref 0–37)
Albumin: 4 g/dL (ref 3.5–5.2)
Alkaline Phosphatase: 67 U/L (ref 39–117)
Bilirubin, Direct: 0.2 mg/dL (ref 0.0–0.3)
Total Bilirubin: 0.7 mg/dL (ref 0.2–1.2)
Total Protein: 6.9 g/dL (ref 6.0–8.3)

## 2023-03-17 LAB — HEMOGLOBIN A1C: Hgb A1c MFr Bld: 5.7 % (ref 4.6–6.5)

## 2023-03-17 LAB — PSA: PSA: 0.7 ng/mL (ref 0.10–4.00)

## 2023-03-17 MED ORDER — WEGOVY 0.25 MG/0.5ML ~~LOC~~ SOAJ: 0.2500 mg | SUBCUTANEOUS | 2 refills | Status: DC

## 2023-03-17 MED ORDER — NITROGLYCERIN 0.4 MG SL SUBL: SUBLINGUAL_TABLET | SUBLINGUAL | 0 refills | Status: DC

## 2023-03-17 MED ORDER — LORAZEPAM 1 MG PO TABS: 1.0000 mg | ORAL_TABLET | Freq: Three times a day (TID) | ORAL | 0 refills | Status: DC | PRN

## 2023-03-17 NOTE — Patient Instructions (Signed)
Start the [facility] 0.25 mg [place] once weekly  After one month, if tolerating well, increase to 0.5 mg once weekly  Monitor blood pressure and be in touch if consistently > 130/80

## 2023-03-17 NOTE — Progress Notes (Signed)
Established Patient Office Visit  Subjective   Patient ID: Hector [name], male    DOB: 02/03/1971  Age: 52 y.o. MRN: [id number]  Chief Complaint  Patient presents with   Annual Exam    HPI   Jacarius is seen for physical exam.  He has history of morbid obesity, CAD with prior stent mid LAD lesion 2021, hypertension, GERD, metabolic syndrome.  He is currently taking atorvastatin 40 mg daily, Bystolic 10 mg daily, losartan 50 mg daily, Plavix 75 mg daily.  He was followed briefly at the bariatric weight loss center.  He recalls being tried on 3 different GLP-1 medications but had some intolerance with belching with each.  He is willing to consider trial again.  Previous A1c's have been normal.  He states his current weight is the heaviest he has ever been.  He is very frustrated with his weight gain.  Currently not exercising regularly.  Having some shortness of breath with things like stair climbing but no chest pain.  Health maintenance reviewed  -Prior hepatitis C screen negative -Normal colonoscopy 2/23 -Tetanus due 2026 -No history of shingles vaccine  Family history-significant for both parents having hypertension.  His father had hyperlipidemia history, type 2 diabetes, history of stroke.  His mom apparently had dementia and history of skin cancers including squamous cell.  Social history-non-smoker.  No regular alcohol use.  He is divorced.  Past Medical History:  Diagnosis Date   Alcohol abuse    Allergy    Anal fissure    Anxiety    Asthma    Back pain    Bilateral swelling of feet    Blood in stool    Chest pain    Coronary artery disease    Depression    Drug use    Febrile seizure ([place])    GERD (gastroesophageal reflux disease)    Heartburn    Hyperlipidemia    Hypertension    Irritable bowel syndrome with diarrhea    Jaundice, newborn    Palpitations    Panic    Panic disorder    Pre-diabetes    Seizures (HCC)    febrile   Shortness of breath    Sleep  apnea    Past Surgical History:  Procedure Laterality Date   CARDIAC CATHETERIZATION     COLONOSCOPY  2005   CORONARY STENT INTERVENTION N/A 08/19/2020   Procedure: CORONARY STENT INTERVENTION;  Surgeon: Jettie Booze, MD;  Location: [facility];  Service: Cardiovascular;  Laterality: N/A;   HEMORRHOID BANDING     INTRAVASCULAR ULTRASOUND/IVUS N/A 08/19/2020   Procedure: Intravascular Ultrasound/IVUS;  Surgeon: Jettie Booze, MD;  Location: [facility];  Service: Cardiovascular;  Laterality: N/A;   LEFT HEART CATH AND CORONARY ANGIOGRAPHY N/A 08/19/2020   Procedure: LEFT HEART CATH AND CORONARY ANGIOGRAPHY;  Surgeon: Jettie Booze, MD;  Location: [facility];  Service: Cardiovascular;  Laterality: N/A;   SIGMOIDOSCOPY     WISDOM TOOTH EXTRACTION  1991    reports that he has never smoked. He has never used smokeless tobacco. He reports current alcohol use. He reports current drug use. Drug: Marijuana. family history includes Basal cell carcinoma in his mother; Cancer in his mother; Colitis in his brother; Colon polyps in his brother and father; Dementia in his mother; Depression in his father and mother; Diabetes in his father; Heart attack in his father; Heart disease (age of onset: 44) in his father; Hyperlipidemia in his  father; Hypertension in his father and mother; Obesity in his father; Skin cancer in his mother; Sleep apnea in his father; Squamous cell carcinoma in his mother; Stroke in his father and mother. Allergies  Allergen Reactions   Other Diarrhea    GLP-1RA    Review of Systems  Constitutional:  Negative for chills, fever, malaise/fatigue and weight loss.  HENT:  Negative for hearing loss.   Eyes:  Negative for blurred vision and double vision.  Respiratory:  Positive for shortness of breath. Negative for cough and wheezing.   Cardiovascular:  Negative for chest pain, palpitations and leg swelling.  Gastrointestinal:  Negative for  abdominal pain, blood in stool, constipation and diarrhea.  Genitourinary:  Negative for dysuria.  Skin:  Negative for rash.  Neurological:  Negative for dizziness, speech change, seizures, loss of consciousness and headaches.  Psychiatric/Behavioral:  Negative for depression.       Objective:     BP (!) 144/92 (BP Location: Left Arm, Cuff Size: Large)   Pulse 64   Temp (!) 97.5 F (36.4 C) (Oral)   Ht 5' 9.69" (1.77 m)   Wt (!) 312 lb 8 oz (141.7 kg)   SpO2 98%   BMI 45.25 kg/m  BP Readings from Last 3 Encounters:  03/17/23 (!) 144/92  01/08/23 120/80  11/30/22 (!) 134/92   Wt Readings from Last 3 Encounters:  03/17/23 (!) 312 lb 8 oz (141.7 kg)  01/08/23 (!) 306 lb (138.8 kg)  12/15/22 (!) 309 lb (140.2 kg)      Physical Exam Vitals reviewed.  Constitutional:      General: He is not in acute distress.    Appearance: He is well-developed.  HENT:     Head: Normocephalic and atraumatic.     Right Ear: External ear normal.     Left Ear: External ear normal.  Eyes:     Conjunctiva/sclera: Conjunctivae normal.     Pupils: Pupils are equal, round, and reactive to light.  Neck:     Thyroid: No thyromegaly.  Cardiovascular:     Rate and Rhythm: Normal rate and regular rhythm.     Heart sounds: Normal heart sounds. No murmur heard. Pulmonary:     Effort: No respiratory distress.     Breath sounds: No wheezing or rales.  Abdominal:     General: Bowel sounds are normal. There is no distension.     Palpations: Abdomen is soft. There is no mass.     Tenderness: There is no abdominal tenderness. There is no guarding or rebound.  Musculoskeletal:     Cervical back: Normal range of motion and neck supple.  Lymphadenopathy:     Cervical: No cervical adenopathy.  Skin:    Findings: No rash.  Neurological:     Mental Status: He is alert and oriented to person, place, and time.     Cranial Nerves: No cranial nerve deficit.      No results found for any visits on  03/17/23.  Last CBC Lab Results  Component Value Date   WBC 8.3 09/03/2021   HGB 14.6 09/03/2021   HCT 42.8 09/03/2021   MCV 90 09/03/2021   MCH 30.5 09/03/2021   RDW 12.8 09/03/2021   PLT 158 [id number]   Last metabolic panel Lab Results  Component Value Date   GLUCOSE 113 (H) 12/09/2022   NA 141 12/09/2022   K 4.3 12/09/2022   CL 104 12/09/2022   CO2 25 12/09/2022   BUN 20 12/09/2022  CREATININE 1.19 12/09/2022   EGFR 74 12/09/2022   CALCIUM 8.8 12/09/2022   PROT 6.7 09/03/2021   ALBUMIN 4.2 09/03/2021   LABGLOB 2.5 09/03/2021   AGRATIO 1.7 09/03/2021   BILITOT 0.4 09/03/2021   ALKPHOS 96 09/03/2021   AST 19 09/03/2021   ALT 18 09/03/2021   ANIONGAP 13 08/07/2020   Last lipids Lab Results  Component Value Date   CHOL 126 09/03/2021   HDL 35 (L) 09/03/2021   LDLCALC 69 09/03/2021   TRIG 122 09/03/2021   CHOLHDL 3.6 09/03/2021   Last hemoglobin A1c Lab Results  Component Value Date   HGBA1C 5.3 09/03/2021      The ASCVD Risk score (Arnett DK, et al., 2019) failed to calculate for the following reasons:   The valid total cholesterol range is 130 to 320 mg/dL    Assessment & Plan:   #1 patient here for physical exam.  He has history of morbid obesity with multiple comorbidities including metabolic syndrome, history of CAD, hypertension, hyperlipidemia, GERD  -Discussed several health maintenance issues -Recommend annual flu vaccine -Discussed Shingrix vaccine.  He had 1 but never got the second.  He is reminded to get that within the next month or so -Long discussion regarding importance of weight loss.  See below. -Obtain screening labs.  He is at substantial risk for diabetes and will check A1c as well. -Discussed importance of regular exercise and low saturated fat diet  #2 morbid obesity with multiple comorbidities as above.  He struggled to lose weight on his own including comprehensive weight loss programs.  We discussed potential trial again of  Wegovy 0.25 mg subcutaneous once weekly and after 1 month if tolerating titrate up to 0.5 mg.  Set up 29-month follow-up.  He is aware of potential side effects  #3 hypertension.  Currently treated with Bystolic and losartan.  Blood pressure was up some today but he feels like he may have better readings at home and suspects whitecoat syndrome.  We did discuss possible titration of medication versus additional medication but he prefers to give few months of monitoring and weight loss.  If not consistently 130/80 or less over the next couple months recommend follow-up to discuss other medications.   Return in about 3 months (around 06/17/2023).    Carolann Littler, MD

## 2023-03-18 MED ORDER — EZETIMIBE 10 MG PO TABS: ORAL_TABLET | ORAL | 0 refills | Status: DC

## 2023-03-18 NOTE — Addendum Note (Signed)
Addended by: Nilda Riggs on: 03/18/2023 11:40 AM   Modules accepted: Orders

## 2023-03-22 ENCOUNTER — Other Ambulatory Visit: Payer: Self-pay | Admitting: Family Medicine

## 2023-03-23 MED ORDER — LOSARTAN POTASSIUM 50 MG PO TABS: 50.0000 mg | ORAL_TABLET | Freq: Every day | ORAL | 2 refills | Status: DC

## 2023-03-29 ENCOUNTER — Other Ambulatory Visit: Payer: Self-pay | Admitting: Family Medicine

## 2023-03-29 ENCOUNTER — Encounter: Payer: Self-pay | Admitting: Family Medicine

## 2023-04-01 ENCOUNTER — Telehealth: Payer: Self-pay | Admitting: Pharmacy Technician

## 2023-04-01 NOTE — Telephone Encounter (Signed)
Patient Advocate Encounter   Received notification that prior authorization for [employer] 0.25MG /0.5ML auto-injectors is required.   PA submitted on 04/01/2023 [place] Electronic PA Form Status is pending

## 2023-04-02 ENCOUNTER — Other Ambulatory Visit (HOSPITAL_COMMUNITY): Payer: Self-pay

## 2023-04-02 NOTE — Telephone Encounter (Signed)
Patient Advocate Encounter  Prior Authorization for [employer] 0.25MG /0.5ML auto-injectors has been approved.    PA# [id number] Effective dates: 04/01/23 through 11/01/23

## 2023-04-05 ENCOUNTER — Other Ambulatory Visit (HOSPITAL_COMMUNITY): Payer: Self-pay

## 2023-04-05 NOTE — Telephone Encounter (Signed)
Pharmacy aware and ordering medication

## 2023-04-23 ENCOUNTER — Other Ambulatory Visit: Payer: Self-pay | Admitting: Family Medicine

## 2023-05-07 ENCOUNTER — Other Ambulatory Visit: Payer: Self-pay | Admitting: Adult Health

## 2023-06-01 ENCOUNTER — Ambulatory Visit: Payer: No Typology Code available for payment source | Admitting: Family Medicine

## 2023-06-03 ENCOUNTER — Other Ambulatory Visit: Payer: Self-pay | Admitting: Family Medicine

## 2023-06-18 ENCOUNTER — Ambulatory Visit: Payer: No Typology Code available for payment source | Admitting: Family Medicine

## 2023-06-18 ENCOUNTER — Encounter: Payer: Self-pay | Admitting: Family Medicine

## 2023-06-18 VITALS — BP 150/90 | HR 60 | Temp 97.5°F | Ht 69.69 in | Wt 305.6 lb

## 2023-06-18 DIAGNOSIS — I10 Essential (primary) hypertension: Secondary | ICD-10-CM

## 2023-06-18 DIAGNOSIS — E785 Hyperlipidemia, unspecified: Secondary | ICD-10-CM

## 2023-06-18 LAB — LIPID PANEL
Cholesterol: 100 mg/dL (ref 0–200)
HDL: 27.8 mg/dL — ABNORMAL LOW (ref >39.00)
LDL Cholesterol: 46 mg/dL (ref 0–99)
NonHDL: 72.17
Total CHOL/HDL Ratio: 4
Triglycerides: 132 mg/dL (ref 0.0–149.0)
VLDL: 26.4 mg/dL (ref 0.0–40.0)

## 2023-06-18 MED ORDER — EZETIMIBE 10 MG PO TABS: ORAL_TABLET | ORAL | 3 refills | Status: DC

## 2023-06-18 MED ORDER — LOSARTAN POTASSIUM-HCTZ 100-12.5 MG PO TABS
1.0000 | ORAL_TABLET | Freq: Every day | ORAL | 3 refills | Status: DC
Start: 2023-06-18 — End: 2024-01-28

## 2023-06-18 NOTE — Progress Notes (Unsigned)
Established Patient Office Visit  Subjective   Patient ID: Hector [name], male    DOB: Jun 18, 1971  Age: 52 y.o. MRN: [id number]  Chief Complaint  Patient presents with   Medical Management of Chronic Issues    HPI  {History (Optional):23778} Hector [name] has history of CAD, hypertension, obesity, metabolic syndrome, panic disorder.  He had physical back in March.  His LDL cholesterol 66.  He already takes high-dose atorvastatin.  We added Zetia and he is here today for follow-up lipids.  He is fasting.  We recently had written for [facility] but he apparently had already tried other GLP-1 medications through weight loss clinic and had adverse side effects he never started this.  His weight is down 7 pounds from last visit.  Recent A1c stable.  His blood pressure has been up some recently.  He recently at the dentist and they would not do a procedure because of his blood pressure being up and this is up again today.  Currently on losartan 50 mg daily and Bystolic 10 mg daily.  He has recently been noting some bilateral ankle edema  Past Medical History:  Diagnosis Date   Alcohol abuse    Allergy    Anal fissure    Anxiety    Asthma    Back pain    Bilateral swelling of feet    Blood in stool    Chest pain    Coronary artery disease    Depression    Drug use    Febrile seizure (HCC)    GERD (gastroesophageal reflux disease)    Heartburn    Hyperlipidemia    Hypertension    Irritable bowel syndrome with diarrhea    Jaundice, newborn    Palpitations    Panic    Panic disorder    Pre-diabetes    Seizures (HCC)    febrile   Shortness of breath    Sleep apnea    Past Surgical History:  Procedure Laterality Date   CARDIAC CATHETERIZATION     COLONOSCOPY  2005   CORONARY STENT INTERVENTION N/A 08/19/2020   Procedure: CORONARY STENT INTERVENTION;  Surgeon: Corky Crafts, MD;  Location: [facility];  Service: Cardiovascular;  Laterality: N/A;   CORONARY ULTRASOUND/IVUS  N/A 08/19/2020   Procedure: Intravascular Ultrasound/IVUS;  Surgeon: Corky Crafts, MD;  Location: [facility];  Service: Cardiovascular;  Laterality: N/A;   HEMORRHOID BANDING     LEFT HEART CATH AND CORONARY ANGIOGRAPHY N/A 08/19/2020   Procedure: LEFT HEART CATH AND CORONARY ANGIOGRAPHY;  Surgeon: Corky Crafts, MD;  Location: [facility];  Service: Cardiovascular;  Laterality: N/A;   SIGMOIDOSCOPY     WISDOM TOOTH EXTRACTION  1991    reports that he has never smoked. He has never used smokeless tobacco. He reports current alcohol use. He reports current drug use. Drug: Marijuana. family history includes Basal cell carcinoma in his mother; Cancer in his mother; Colitis in his brother; Colon polyps in his brother and father; Dementia in his mother; Depression in his father and mother; Diabetes in his father; Heart attack in his father; Heart disease (age of onset: 82) in his father; Hyperlipidemia in his father; Hypertension in his father and mother; Obesity in his father; Skin cancer in his mother; Sleep apnea in his father; Squamous cell carcinoma in his mother; Stroke in his father and mother. Allergies  Allergen Reactions   Other Diarrhea    GLP-1RA    Review  of Systems  Constitutional:  Negative for malaise/fatigue.  Eyes:  Negative for blurred vision.  Respiratory:  Negative for shortness of breath.   Cardiovascular:  Negative for chest pain.  Neurological:  Negative for dizziness, weakness and headaches.      Objective:     BP (!) 150/90 (BP Location: Left Arm, Patient Position: Sitting, Cuff Size: Large)   Pulse 60   Temp (!) 97.5 F (36.4 C) (Oral)   Ht 5' 9.69" (1.77 m)   Wt (!) 305 lb 9.6 oz (138.6 kg)   SpO2 98%   BMI 44.24 kg/m  {Vitals History (Optional):23777}  Physical Exam Vitals reviewed.  Constitutional:      Appearance: He is well-developed.  HENT:     Right Ear: External ear normal.     Left Ear: External ear normal.   Eyes:     Pupils: Pupils are equal, round, and reactive to light.  Neck:     Thyroid: No thyromegaly.  Cardiovascular:     Rate and Rhythm: Normal rate and regular rhythm.  Pulmonary:     Effort: Pulmonary effort is normal. No respiratory distress.     Breath sounds: Normal breath sounds. No wheezing or rales.  Musculoskeletal:     Cervical back: Neck supple.  Neurological:     Mental Status: He is alert and oriented to person, place, and time.      No results found for any visits on 06/18/23.  {Labs (Optional):23779}  The ASCVD Risk score (Arnett DK, et al., 2019) failed to calculate for the following reasons:   The valid total cholesterol range is 130 to 320 mg/dL    Assessment & Plan:   #1 poorly controlled hypertension.  Continue weight loss efforts.  Change losartan to losartan/HCTZ 100/12.5 mg 1 daily.  Recheck in 1 month and consider basic metabolic panel at follow-up  #2 hyperlipidemia with goal LDL less than 55.  Recent addition of Zetia to his high-dose atorvastatin.  Recheck fasting lipids today.  Continue low saturated fat diet.      Evelena Peat, MD

## 2023-06-24 ENCOUNTER — Encounter: Payer: Self-pay | Admitting: Family Medicine

## 2023-07-19 ENCOUNTER — Encounter: Payer: Self-pay | Admitting: Family Medicine

## 2023-07-19 ENCOUNTER — Ambulatory Visit: Payer: No Typology Code available for payment source | Admitting: Family Medicine

## 2023-07-19 VITALS — BP 142/86 | HR 69 | Ht 69.69 in | Wt 304.9 lb

## 2023-07-19 DIAGNOSIS — I10 Essential (primary) hypertension: Secondary | ICD-10-CM | POA: Diagnosis not present

## 2023-07-19 MED ORDER — LORAZEPAM 1 MG PO TABS: 1.0000 mg | ORAL_TABLET | Freq: Three times a day (TID) | ORAL | 0 refills | Status: DC | PRN

## 2023-07-19 NOTE — Progress Notes (Signed)
Established Patient Office Visit  Subjective   Patient ID: Hector [name], male    DOB: Dec 26, 1971  Age: 52 y.o. MRN: [id number]  Chief Complaint  Patient presents with   Medical Management of Chronic Issues    HPI   Hector [name] is seen for follow-up hypertension.  He also has history of CAD, GERD, obesity, metabolic syndrome.  He had been followed by bariatric weight loss center but did not have any real success with weight loss.  He was seen recently here with poorly controlled blood pressure in we switched him from plain losartan to losartan/HCTZ with increased dosage as well of losartan.  He already had been taking Bystolic   not monitoring home blood pressures.  Not currently exercising regularly.  Poor compliance with diet.  His lipids have been fairly well-controlled with Zetia and atorvastatin.  Denies any recent chest pains.  Knows he needs to watch sodium intake closer.  Tends to prepare a lot of foods that are high sodium  Past Medical History:  Diagnosis Date   Alcohol abuse    Allergy    Anal fissure    Anxiety    Asthma    Back pain    Bilateral swelling of feet    Blood in stool    Chest pain    Coronary artery disease    Depression    Drug use    Febrile seizure (HCC)    GERD (gastroesophageal reflux disease)    Heartburn    Hyperlipidemia    Hypertension    Irritable bowel syndrome with diarrhea    Jaundice, newborn    Palpitations    Panic    Panic disorder    Pre-diabetes    Seizures (HCC)    febrile   Shortness of breath    Sleep apnea    Past Surgical History:  Procedure Laterality Date   CARDIAC CATHETERIZATION     COLONOSCOPY  2005   CORONARY STENT INTERVENTION N/A 08/19/2020   Procedure: CORONARY STENT INTERVENTION;  Surgeon: Hector Crafts, MD;  Location: [facility];  Service: Cardiovascular;  Laterality: N/A;   CORONARY ULTRASOUND/IVUS N/A 08/19/2020   Procedure: Intravascular Ultrasound/IVUS;  Surgeon: Hector Crafts, MD;   Location[facility];  Service: Cardiovascular;  Laterality: N/A;   HEMORRHOID BANDING     LEFT HEART CATH AND CORONARY ANGIOGRAPHY N/A 08/19/2020   Procedure: LEFT HEART CATH AND CORONARY ANGIOGRAPHY;  Surgeon: Hector Crafts, MD;  Location: [facility];  Service: Cardiovascular;  Laterality: N/A;   SIGMOIDOSCOPY     WISDOM TOOTH EXTRACTION  1991    reports that he has never smoked. He has never used smokeless tobacco. He reports current alcohol use. He reports current drug use. Drug: Marijuana. family history includes Basal cell carcinoma in his mother; Cancer in his mother; Colitis in his brother; Colon polyps in his brother and father; Dementia in his mother; Depression in his father and mother; Diabetes in his father; Heart attack in his father; Heart disease (age of onset: 38) in his father; Hyperlipidemia in his father; Hypertension in his father and mother; Obesity in his father; Skin cancer in his mother; Sleep apnea in his father; Squamous cell carcinoma in his mother; Stroke in his father and mother. Allergies  Allergen Reactions   Other Diarrhea    GLP-1RA    Review of Systems  Constitutional:  Negative for malaise/fatigue.  Eyes:  Negative for blurred vision.  Respiratory:  Negative for  shortness of breath.   Cardiovascular:  Negative for chest pain.  Neurological:  Negative for dizziness, weakness and headaches.      Objective:     BP (!) 142/86 (BP Location: Left Arm, Cuff Size: Large)   Pulse 69   Ht 5' 9.69" (1.77 m)   Wt (!) 304 lb 14.4 oz (138.3 kg)   SpO2 98%   BMI 44.14 kg/m  BP Readings from Last 3 Encounters:  07/19/23 (!) 142/86  06/18/23 (!) 150/90  03/17/23 (!) 144/92   Wt Readings from Last 3 Encounters:  07/19/23 (!) 304 lb 14.4 oz (138.3 kg)  06/18/23 (!) 305 lb 9.6 oz (138.6 kg)  03/17/23 (!) 312 lb 8 oz (141.7 kg)      Physical Exam Vitals reviewed.  Constitutional:      Appearance: He is well-developed.  HENT:      Right Ear: External ear normal.     Left Ear: External ear normal.  Eyes:     Pupils: Pupils are equal, round, and reactive to light.  Neck:     Thyroid: No thyromegaly.  Cardiovascular:     Rate and Rhythm: Normal rate and regular rhythm.  Pulmonary:     Effort: Pulmonary effort is normal. No respiratory distress.     Breath sounds: Normal breath sounds. No wheezing or rales.  Musculoskeletal:     Cervical back: Neck supple.     Right lower leg: No edema.     Left lower leg: No edema.  Neurological:     Mental Status: He is alert and oriented to person, place, and time.      No results found for any visits on 07/19/23.  Last CBC Lab Results  Component Value Date   WBC 6.1 03/17/2023   HGB 15.4 03/17/2023   HCT 44.2 03/17/2023   MCV 87.5 03/17/2023   MCH 30.5 09/03/2021   RDW 13.1 03/17/2023   PLT 173.0 03/17/2023   Last metabolic panel Lab Results  Component Value Date   GLUCOSE 106 (H) 03/17/2023   NA 139 03/17/2023   K 3.9 03/17/2023   CL 107 03/17/2023   CO2 22 03/17/2023   BUN 23 03/17/2023   CREATININE 1.03 03/17/2023   GFR 84.16 03/17/2023   CALCIUM 9.0 03/17/2023   PROT 6.9 03/17/2023   ALBUMIN 4.0 03/17/2023   LABGLOB 2.5 09/03/2021   AGRATIO 1.7 09/03/2021   BILITOT 0.7 03/17/2023   ALKPHOS 67 03/17/2023   AST 25 03/17/2023   ALT 30 03/17/2023   ANIONGAP 13 08/07/2020   Last lipids Lab Results  Component Value Date   CHOL 100 06/18/2023   HDL 27.80 (L) 06/18/2023   LDLCALC 46 06/18/2023   TRIG 132.0 06/18/2023   CHOLHDL 4 06/18/2023   Last hemoglobin A1c Lab Results  Component Value Date   HGBA1C 5.7 03/17/2023      The ASCVD Risk score (Arnett DK, et al., 2019) failed to calculate for the following reasons:   The valid total cholesterol range is 130 to 320 mg/dL    Assessment & Plan:   Hypertension.  Follow-up reading left arm seated after rest 142/86.  Still suboptimally controlled.  We discussed options with patient including  additional medication such as calcium channel blocker versus couple months of lifestyle modification and he prefers the latter.  -Set up 25-month follow-up -Discussed nonpharmacologic management with sodium reduction, weight loss, regular aerobic exercise -Check basic metabolic panel today with recent addition of hydrochlorothiazide -If blood pressure not further controlled  at follow-up consider possibly amlodipine  Evelena Peat, MD

## 2023-08-12 ENCOUNTER — Encounter (INDEPENDENT_AMBULATORY_CARE_PROVIDER_SITE_OTHER): Payer: Self-pay

## 2023-08-24 NOTE — Progress Notes (Unsigned)
Office Visit    Hector [name] Name: Hector [name] Date of Encounter: 08/26/2023  Primary Care Provider:  Kristian Covey, MD Primary Cardiologist:  Hector Muss, MD Primary Electrophysiologist: None   Past Medical History    Past Medical History:  Diagnosis Date   Alcohol abuse    Allergy    Anal fissure    Anxiety    Asthma    Back pain    Bilateral swelling of feet    Blood in stool    Chest pain    Coronary artery disease    Depression    Drug use    Febrile seizure (HCC)    GERD (gastroesophageal reflux disease)    Heartburn    Hyperlipidemia    Hypertension    Irritable bowel syndrome with diarrhea    Jaundice, newborn    Palpitations    Panic    Panic disorder    Pre-diabetes    Seizures (HCC)    febrile   Shortness of breath    Sleep apnea    Past Surgical History:  Procedure Laterality Date   CARDIAC CATHETERIZATION     COLONOSCOPY  2005   CORONARY STENT INTERVENTION N/A 08/19/2020   Procedure: CORONARY STENT INTERVENTION;  Surgeon: Hector Crafts, MD;  Location: [facility] INVASIVE CV LAB;  Service: Cardiovascular;  Laterality: N/A;   CORONARY ULTRASOUND/IVUS N/A 08/19/2020   Procedure: Intravascular Ultrasound/IVUS;  Surgeon: Hector Crafts, MD;  Location: [facility] INVASIVE CV LAB;  Service: Cardiovascular;  Laterality: N/A;   HEMORRHOID BANDING     LEFT HEART CATH AND CORONARY ANGIOGRAPHY N/A 08/19/2020   Procedure: LEFT HEART CATH AND CORONARY ANGIOGRAPHY;  Surgeon: Hector Crafts, MD;  Location: [facility] INVASIVE CV LAB;  Service: Cardiovascular;  Laterality: N/A;   SIGMOIDOSCOPY     WISDOM TOOTH EXTRACTION  1991    Allergies  Allergies  Allergen Reactions   Other Diarrhea    GLP-1RA     History of Present Illness    Hector [name] is a 52 y.o. male with PMH of CAD s/p DES to mid LAD 2021, HTN, HLD, morbid obesity, GERD, sleep apnea who presents today for 88-month follow-up.  Hector [name] completed a left heart catheter and 2021 due to  complaint of chest pain that revealed 95% mid LAD lesion treated with DES x 1 with 30% stenosed RCA and EF of 55-65% with proximal LAD lesion of 20%.  He was started on Brilinta and ASA 81 mg.  He was seen initially by me in 11/2022 and had reports of increased shortness of breath and elevated blood pressures.  2D echo was completed that showed EF of 65 to 70% with no RWMA and grade 1 DD, normal RA/LA, normal valvular function.  Exercise Myoview was also completed and was normal and low risk.  He was seen in our office for follow-up 1 month later and blood pressures were controlled.  He was motivated to lose weight and was referred to the [facility] prep program.  Since last being seen in the office Hector [name] reports that he has been doing well but does note occasional episodes of mid sternal chest pain that he describes as a dull ache.  He reports that discomfort is not associated with activity and is not reproducible.  He is aware to take as needed nitroglycerin if chest pain returns and is not relieved with rest.  He is compliant with his current medication regimen and denies any adverse reactions.  His blood  pressure today is controlled at 138/80.  During today's visit we discussed the importance of primary and secondary prevention of cardiovascular disease.  He is motivated to lose weight and we discussed strategies to help with his weight loss goals.  Hector [name] denies chest pain, palpitations, dyspnea, PND, orthopnea, nausea, vomiting, dizziness, syncope, edema, weight gain, or early satiety.   Home Medications    Current Outpatient Medications  Medication Sig Dispense Refill   aspirin EC 81 MG tablet Take 81 mg by mouth daily. Swallow whole.     atorvastatin (LIPITOR) 40 MG tablet TAKE 1 TABLET BY MOUTH EVERY DAY 90 tablet 1   clopidogrel (PLAVIX) 75 MG tablet Take 1 tablet (75 mg total) by mouth daily. 90 tablet 3   ezetimibe (ZETIA) 10 MG tablet Take 1 tablet by mouth once daily. 90 tablet 3   LORazepam  (ATIVAN) 1 MG tablet Take 1 tablet (1 mg total) by mouth every 8 (eight) hours as needed. for anxiety 30 tablet 0   losartan-hydrochlorothiazide (HYZAAR) 100-12.5 MG tablet Take 1 tablet by mouth daily. 90 tablet 3   nebivolol (BYSTOLIC) 10 MG tablet TAKE 1 TABLET ONCE DAILY 90 tablet 0   nitroGLYCERIN (NITROSTAT) 0.4 MG SL tablet PLACE 1 TABLET UNDER THE TONGUE EVERY 5 MINUTES AS NEEDED FOR CHEST PAIN. 450 tablet 1   No current facility-administered medications for this visit.     Review of Systems  Please see the history of present illness.    (+) Chest pain (+) Shortness of breath  All other systems reviewed and are otherwise negative except as noted above.  Physical Exam    Wt Readings from Last 3 Encounters:  08/26/23 (!) 306 lb (138.8 kg)  07/19/23 (!) 304 lb 14.4 oz (138.3 kg)  06/18/23 (!) 305 lb 9.6 oz (138.6 kg)   VS: Vitals:   08/26/23 1459  BP: 138/80  Pulse: 66  SpO2: 98%  ,Body mass index is 45.19 kg/m.  Constitutional:      Appearance: Healthy appearance. Not in distress.  Neck:     Vascular: JVD normal.  Pulmonary:     Effort: Pulmonary effort is normal.     Breath sounds: No wheezing. No rales. Diminished in the bases Cardiovascular:     Normal rate. Regular rhythm. Normal S1. Normal S2.      Murmurs: There is no murmur.  Edema:    Peripheral edema absent.  Abdominal:     Palpations: Abdomen is soft non tender. There is no hepatomegaly.  Skin:    General: Skin is warm and dry.  Neurological:     General: No focal deficit present.     Mental Status: Alert and oriented to person, place and time.     Cranial Nerves: Cranial nerves are intact.  EKG/LABS/ Recent Cardiac Studies    ECG personally reviewed by me today -sinus rhythm with rate of 66 bpm and premature ventricular complex with no acute changes consistent with previous   Lab Results  Component Value Date   WBC 6.1 03/17/2023   HGB 15.4 03/17/2023   HCT 44.2 03/17/2023   MCV 87.5  03/17/2023   PLT 173.0 03/17/2023   Lab Results  Component Value Date   CREATININE 1.03 03/17/2023   BUN 23 03/17/2023   NA 139 03/17/2023   K 3.9 03/17/2023   CL 107 03/17/2023   CO2 22 03/17/2023   Lab Results  Component Value Date   ALT 30 03/17/2023   AST 25 03/17/2023   ALKPHOS  67 03/17/2023   BILITOT 0.7 03/17/2023   Lab Results  Component Value Date   CHOL 100 06/18/2023   HDL 27.80 (L) 06/18/2023   LDLCALC 46 06/18/2023   TRIG 132.0 06/18/2023   CHOLHDL 4 06/18/2023    Lab Results  Component Value Date   HGBA1C 5.7 03/17/2023     Assessment & Plan    1.Coronary artery disease: -s/p LHC 2021 with 95% LAD treated with DES x 1.  Today Hector [name] reports no recurrent chest pain or angina. -Today Hector [name] reported central chest pain that is not associated with activity occurs occasionally and is resolved spontaneously. -He was encouraged to try as needed Nitrostat in the future for discomfort -We will discontinue atenolol and start Toprol-XL 25 mg daily -Continue GDMT with ASA 81 mg, Lipitor 40 mg, Plavix 75 mg, Zetia 10 mg, Toprol-XL 25 mg  2.Essential hypertension: -Hector [name]'s blood pressure today was well-controlled at 138/80 -Continue Hyzaar 100-12.5 mg  3.Hyperlipidemia: -Hector [name]'s last LDL cholesterol was improved at 46 -Continue ezetimibe 10 mg daily and Lipitor 40 mg daily  4.Morbid obesity: -Hector [name]'s BMI is 45.19 kg/m -Hector [name] is currently motivated to lose weight and will be referred to our health coach for further assistance -Hector [name] will also be referred back to [facility] prep program  Disposition: Follow-up with Hector Muss, MD or APP in 12 months    Medication Adjustments/Labs and Tests Ordered: Current medicines are reviewed at length with the Hector [name] today.  Concerns regarding medicines are outlined above.   Signed, Napoleon Form, Leodis Rains, NP 08/26/2023, 3:47 PM [facility]

## 2023-08-25 ENCOUNTER — Other Ambulatory Visit: Payer: Self-pay | Admitting: Family Medicine

## 2023-08-26 ENCOUNTER — Ambulatory Visit: Payer: No Typology Code available for payment source | Attending: Nurse Practitioner | Admitting: Nurse Practitioner

## 2023-08-26 ENCOUNTER — Encounter: Payer: Self-pay | Admitting: Nurse Practitioner

## 2023-08-26 VITALS — BP 138/80 | HR 66 | Ht 69.0 in | Wt 306.0 lb

## 2023-08-26 DIAGNOSIS — I2510 Atherosclerotic heart disease of native coronary artery without angina pectoris: Secondary | ICD-10-CM

## 2023-08-26 DIAGNOSIS — I10 Essential (primary) hypertension: Secondary | ICD-10-CM | POA: Diagnosis not present

## 2023-08-26 DIAGNOSIS — E7800 Pure hypercholesterolemia, unspecified: Secondary | ICD-10-CM | POA: Diagnosis not present

## 2023-08-26 MED ORDER — METOPROLOL SUCCINATE ER 25 MG PO TB24: 25.0000 mg | ORAL_TABLET | Freq: Every day | ORAL | 3 refills | Status: DC

## 2023-08-26 NOTE — Patient Instructions (Addendum)
Medication Instructions:  STOP Bystolic  START Toprol XL 25mg  Take 1 tablet once a day  *If you need a refill on your cardiac medications before your next appointment, please call your pharmacy*   Lab Work: None ordered   Testing/Procedures: None ordered   Follow-Up: At [facility], you and your health needs are our priority.  As part of our continuing mission to provide you with exceptional heart care, we have created designated Provider Care Teams.  These Care Teams include your primary Cardiologist (physician) and Advanced Practice Providers (APPs -  Physician Assistants and Nurse Practitioners) who all work together to provide you with the care you need, when you need it.  We recommend signing up for the patient portal called "MyChart".  Sign up information is provided on this After Visit Summary.  MyChart is used to connect with patients for Virtual Visits (Telemedicine).  Patients are able to view lab/test results, encounter notes, upcoming appointments, etc.  Non-urgent messages can be sent to your provider as well.   To learn more about what you can do with MyChart, go to [email or web address].    Your next appointment:   12 month(s)  Provider:   Needs New Cardiologist  Other Instructions PREP Program Get come ted hose or compression stockings Referral to Care Guide

## 2023-08-27 ENCOUNTER — Other Ambulatory Visit: Payer: Self-pay | Admitting: Nurse Practitioner

## 2023-08-27 ENCOUNTER — Telehealth: Payer: Self-pay

## 2023-08-27 DIAGNOSIS — Z0000 Encounter for general adult medical examination without abnormal findings: Secondary | ICD-10-CM

## 2023-08-27 NOTE — Telephone Encounter (Signed)
Called patient per health coaching referral for healthy eating habits per Robin Searing, NP. Patient did not answer. Left message for patient to return call.    Renaee Munda, MS, ERHD, [facility]  Care Guide, Health & Wellness Coach [address] [place] [address] Telephone: [phone] Email: [email or web address]

## 2023-09-03 ENCOUNTER — Telehealth: Payer: Self-pay

## 2023-09-03 DIAGNOSIS — Z0000 Encounter for general adult medical examination without abnormal findings: Secondary | ICD-10-CM

## 2023-09-03 NOTE — Telephone Encounter (Signed)
Called patient to follow up on health coaching referral for healthy eating per Robin Searing, NP. Patient did not answer phone. Left message for patient to return call.    Renaee Munda, MS, ERHD, [facility]  Care Guide, Health & Wellness Coach [address], [address] [place] [address] Telephone: [phone] Email: [email or web address]

## 2023-09-15 ENCOUNTER — Telehealth: Payer: No Typology Code available for payment source | Admitting: Family Medicine

## 2023-09-15 DIAGNOSIS — L03115 Cellulitis of right lower limb: Secondary | ICD-10-CM

## 2023-09-15 NOTE — Patient Instructions (Signed)
Please be seen in person for evaluation and care.

## 2023-09-15 NOTE — Progress Notes (Signed)
Virtual Visit Consent   Hector [name], you are scheduled for a virtual visit with a [facility] provider today. Just as with appointments in the office, your consent must be obtained to participate. Your consent will be active for this visit and any virtual visit you may have with one of our providers in the next 365 days. If you have a MyChart account, a copy of this consent can be sent to you electronically.  As this is a virtual visit, video technology does not allow for your provider to perform a traditional examination. This may limit your provider's ability to fully assess your condition. If your provider identifies any concerns that need to be evaluated in person or the need to arrange testing (such as labs, EKG, etc.), we will make arrangements to do so. Although advances in technology are sophisticated, we cannot ensure that it will always work on either your end or our end. If the connection with a video visit is poor, the visit may have to be switched to a telephone visit. With either a video or telephone visit, we are not always able to ensure that we have a secure connection.  By engaging in this virtual visit, you consent to the provision of healthcare and authorize for your insurance to be billed (if applicable) for the services provided during this visit. Depending on your insurance coverage, you may receive a charge related to this service.  I need to obtain your verbal consent now. Are you willing to proceed with your visit today? Hector [name] has provided verbal consent on 09/15/2023 for a virtual visit (video or telephone). Freddy Finner, NP  Date: 09/15/2023 3:52 PM  Virtual Visit via Video Note   I, Freddy Finner, connected with  Hector [name]  ([id number], 1971-11-05) on 09/15/23 at  3:45 PM EDT by a video-enabled telemedicine application and verified that I am speaking with the correct person using two identifiers.  Location: Patient: Virtual Visit Location Patient:  Home Provider: Virtual Visit Location Provider: Home Office   I discussed the limitations of evaluation and management by telemedicine and the availability of in person appointments. The patient expressed understanding and agreed to proceed.    History of Present Illness: Hector [name] is a 52 y.o. who identifies as a male who was assigned male at birth, and is being seen today for on going infection- cellulitis   Of note has had this several times in same leg. 3 years ago, and last winter, and 2 weeks ago. Poor circulation in legs- prone to rashes, swelling  History- Painful and swollen right leg with redness for 2 days prior to starting medication. Keflex back on 08/30/2023 seen at UC Note in Epic  Today: Finished meds never got 100% better, not worsening quickly. Leg is still having redness and warmth, now in foot with swelling, and having trouble with burning and hurting.   On Plavix and ASA  Problems:  Patient Active Problem List   Diagnosis Date Noted   Metabolic syndrome 09/23/2020   Coronary artery disease involving native coronary artery of native heart without angina pectoris    Elevated cholesterol 09/02/2016   Morbid obesity (HCC) 01/22/2014   Depression 11/22/2012   History of anal fissures 11/22/2012   GERD (gastroesophageal reflux disease) 11/22/2012   Panic disorder 11/22/2012   Hypertension 11/22/2012   History of febrile seizure 11/22/2012    Allergies:  Allergies  Allergen Reactions   Other Diarrhea    GLP-1RA  Medications:  Current Outpatient Medications:    aspirin EC 81 MG tablet, Take 81 mg by mouth daily. Swallow whole., Disp: , Rfl:    atorvastatin (LIPITOR) 40 MG tablet, TAKE 1 TABLET BY MOUTH EVERY DAY, Disp: 90 tablet, Rfl: 1   clopidogrel (PLAVIX) 75 MG tablet, Take 1 tablet (75 mg total) by mouth daily., Disp: 90 tablet, Rfl: 3   ezetimibe (ZETIA) 10 MG tablet, Take 1 tablet by mouth once daily., Disp: 90 tablet, Rfl: 3   LORazepam (ATIVAN) 1 MG  tablet, Take 1 tablet (1 mg total) by mouth every 8 (eight) hours as needed. for anxiety, Disp: 30 tablet, Rfl: 0   losartan-hydrochlorothiazide (HYZAAR) 100-12.5 MG tablet, Take 1 tablet by mouth daily., Disp: 90 tablet, Rfl: 3   metoprolol succinate (TOPROL-XL) 25 MG 24 hr tablet, TAKE 1 TABLET (25 MG TOTAL) BY MOUTH DAILY (STOP ATENOLOL), Disp: 90 tablet, Rfl: 3   nitroGLYCERIN (NITROSTAT) 0.4 MG SL tablet, PLACE 1 TABLET UNDER THE TONGUE EVERY 5 MINUTES AS NEEDED FOR CHEST PAIN., Disp: 450 tablet, Rfl: 1  Observations/Objective: Patient is well-developed, well-nourished in no acute distress.  Resting comfortably  at home.  Head is normocephalic, atraumatic.  No labored breathing.  Speech is clear and coherent with logical content.  Patient is alert and oriented at baseline.  Swelling in foot up to calf Red band around most of the medial aspect of lower leg before ankle. Noted blanching when pt pressed on area  Assessment and Plan:  1. Cellulitis of right lower extremity  Go be seen in person to r/o DVT  Possible change in ABX treatment therapy might be needed Would benefit from vein and  vascular specialist referral  Reviewed side effects, risks and benefits of medication.    Patient acknowledged agreement and understanding of the plan.   Past Medical, Surgical, Social History, Allergies, and Medications have been Reviewed.     Follow Up Instructions: I discussed the assessment and treatment plan with the patient. The patient was provided an opportunity to ask questions and all were answered. The patient agreed with the plan and demonstrated an understanding of the instructions.  A copy of instructions were sent to the patient via MyChart unless otherwise noted below.     The patient was advised to call back or seek an in-person evaluation if the symptoms worsen or if the condition fails to improve as anticipated.  Time:  I spent 4 minutes with the patient via telehealth  technology discussing the above problems/concerns.    Freddy Finner, NP

## 2023-09-16 ENCOUNTER — Encounter: Payer: Self-pay | Admitting: Adult Health

## 2023-09-16 ENCOUNTER — Telehealth: Payer: Self-pay

## 2023-09-16 ENCOUNTER — Ambulatory Visit: Payer: No Typology Code available for payment source | Admitting: Adult Health

## 2023-09-16 VITALS — BP 138/80 | HR 91 | Temp 98.1°F | Ht 69.0 in | Wt 315.0 lb

## 2023-09-16 DIAGNOSIS — R600 Localized edema: Secondary | ICD-10-CM

## 2023-09-16 DIAGNOSIS — Z0000 Encounter for general adult medical examination without abnormal findings: Secondary | ICD-10-CM

## 2023-09-16 NOTE — Telephone Encounter (Signed)
Called patient per health coaching referral for healthy eating. Patient stated that he is planning on incorporating physical activity by going to the [facility] and explained the changes he is making to his diet by increasing his lean protein intake and reducing his refined carbohydrates to start. Patient did not express interest in participating in health coaching at this time.   Renaee Munda, MS, ERHD, [facility]  Care Guide, Health & Wellness Coach [address] [place] [address] Telephone: [phone] Email: [email or web address]

## 2023-09-16 NOTE — Progress Notes (Signed)
Subjective:    Patient ID: Hector [name], male    DOB: [birth year], 52 y.o.   MRN: [id number]  HPI 52 year old male who  has a past medical history of Alcohol abuse, Allergy, Anal fissure, Anxiety, Asthma, Back pain, Bilateral swelling of feet, Blood in stool, Chest pain, Coronary artery disease, Depression, Drug use, Febrile seizure (HCC), GERD (gastroesophageal reflux disease), Heartburn, Hyperlipidemia, Hypertension, Irritable bowel syndrome with diarrhea, Jaundice, newborn, Palpitations, Panic, Panic disorder, Pre-diabetes, Seizures (HCC), Shortness of breath, and Sleep apnea.  He is a patient of Dr. Caryl Never who I am seeing today for follow up. He was seen at Urgent Care on 08/30/2023 for cellulitis on right lower leg. He has a history of cellulitis due to having poor circulation in the lower legs. He was placed on 10 days of Keflex TID.  Does report that after about 3 days of taking antibiotics that the pain dissipated but he continued to have redness, warmth, and edema to his right lower extremity; symptoms never resolved or seem to get any better.  Finished his antibiotics roughly 5-1/2 days ago and the pain started to come back.  He has not had any chest pain or shortness of breath.  He does not have a history of blood clots. He is on Plavix for CAD   Review of Systems See HPI   Past Medical History:  Diagnosis Date   Alcohol abuse    Allergy    Anal fissure    Anxiety    Asthma    Back pain    Bilateral swelling of feet    Blood in stool    Chest pain    Coronary artery disease    Depression    Drug use    Febrile seizure (HCC)    GERD (gastroesophageal reflux disease)    Heartburn    Hyperlipidemia    Hypertension    Irritable bowel syndrome with diarrhea    Jaundice, newborn    Palpitations    Panic    Panic disorder    Pre-diabetes    Seizures (HCC)    febrile   Shortness of breath    Sleep apnea     Social History   Socioeconomic History   Marital status:  Divorced    Spouse name: Not on file   Number of children: 1   Years of education: Not on file   Highest education level: Bachelor's degree (e.g., BA, AB, BS)  Occupational History   Occupation: Cytogeneticist  Tobacco Use   Smoking status: Never   Smokeless tobacco: Never  Vaping Use   Vaping status: Former  Substance and Sexual Activity   Alcohol use: Yes    Alcohol/week: 0.0 standard drinks of alcohol    Comment: 1-5 per week   Drug use: Yes    Types: Marijuana    Comment: Reports sometimes he smokes daily sometimes weekly   Sexual activity: Yes  Other Topics Concern   Not on file  Social History Narrative   Patient is right-handed. He lives in a 2 story home. He drinks 2 20 oz sodas a day. He does not exercise.   Social Determinants of Health   Financial Resource Strain: Low Risk  (06/17/2023)   Overall Financial Resource Strain (CARDIA)    Difficulty of Paying Living Expenses: Not very hard  Food Insecurity: No Food Insecurity (06/17/2023)   Hunger Vital Sign    Worried About Running Out of Food in the Last Year: Never  true    Ran Out of Food in the Last Year: Never true  Transportation Needs: No Transportation Needs (06/17/2023)   PRAPARE - Administrator, Civil Service (Medical): No    Lack of Transportation (Non-Medical): No  Physical Activity: Insufficiently Active (06/17/2023)   Exercise Vital Sign    Days of Exercise per Week: 1 day    Minutes of Exercise per Session: 30 min  Stress: Stress Concern Present (06/17/2023)   [employer] of Occupational Health - Occupational Stress Questionnaire    Feeling of Stress : Rather much  Social Connections: Socially Isolated (06/17/2023)   Social Connection and Isolation Panel [NHANES]    Frequency of Communication with Friends and Family: Once a week    Frequency of Social Gatherings with Friends and Family: Never    Attends Religious Services: Never    Database administrator or Organizations: No     Attends Engineer, structural: Not on file    Marital Status: Divorced  Intimate Partner Violence: Not on file    Past Surgical History:  Procedure Laterality Date   CARDIAC CATHETERIZATION     COLONOSCOPY  2005   CORONARY STENT INTERVENTION N/A 08/19/2020   Procedure: CORONARY STENT INTERVENTION;  Surgeon: Corky Crafts, MD;  Location: [facility] INVASIVE CV LAB;  Service: Cardiovascular;  Laterality: N/A;   CORONARY ULTRASOUND/IVUS N/A 08/19/2020   Procedure: Intravascular Ultrasound/IVUS;  Surgeon: Corky Crafts, MD;  Location: [facility] INVASIVE CV LAB;  Service: Cardiovascular;  Laterality: N/A;   HEMORRHOID BANDING     LEFT HEART CATH AND CORONARY ANGIOGRAPHY N/A 08/19/2020   Procedure: LEFT HEART CATH AND CORONARY ANGIOGRAPHY;  Surgeon: Corky Crafts, MD;  Location: [facility] INVASIVE CV LAB;  Service: Cardiovascular;  Laterality: N/A;   SIGMOIDOSCOPY     WISDOM TOOTH EXTRACTION  1991    Family History  Problem Relation Age of Onset   Cancer Mother    Dementia Mother    Stroke Mother    Hypertension Mother    Skin cancer Mother    Basal cell carcinoma Mother    Squamous cell carcinoma Mother    Depression Mother    Diabetes Father    Hyperlipidemia Father    Stroke Father    Heart disease Father 40   Colon polyps Father    Heart attack Father    Hypertension Father    Depression Father    Sleep apnea Father    Obesity Father    Colitis Brother    Colon polyps Brother     Allergies  Allergen Reactions   Other Diarrhea    GLP-1RA    Current Outpatient Medications on File Prior to Visit  Medication Sig Dispense Refill   aspirin EC 81 MG tablet Take 81 mg by mouth daily. Swallow whole.     atorvastatin (LIPITOR) 40 MG tablet TAKE 1 TABLET BY MOUTH EVERY DAY 90 tablet 1   clopidogrel (PLAVIX) 75 MG tablet Take 1 tablet (75 mg total) by mouth daily. 90 tablet 3   ezetimibe (ZETIA) 10 MG tablet Take 1 tablet by mouth once daily. 90 tablet 3   LORazepam  (ATIVAN) 1 MG tablet Take 1 tablet (1 mg total) by mouth every 8 (eight) hours as needed. for anxiety 30 tablet 0   losartan-hydrochlorothiazide (HYZAAR) 100-12.5 MG tablet Take 1 tablet by mouth daily. 90 tablet 3   metoprolol succinate (TOPROL-XL) 25 MG 24 hr tablet TAKE 1 TABLET (25 MG TOTAL) BY MOUTH DAILY (  STOP ATENOLOL) 90 tablet 3   nitroGLYCERIN (NITROSTAT) 0.4 MG SL tablet PLACE 1 TABLET UNDER THE TONGUE EVERY 5 MINUTES AS NEEDED FOR CHEST PAIN. 450 tablet 1   No current facility-administered medications on file prior to visit.    BP 138/80   Pulse 91   Temp 98.1 F (36.7 C) (Oral)   Ht 5\' 9"  (1.753 m)   Wt (!) 315 lb (142.9 kg)   SpO2 97%   BMI 46.52 kg/m       Objective:   Physical Exam Vitals and nursing note reviewed.  Constitutional:      Appearance: Normal appearance. He is obese.  Musculoskeletal:        General: Normal range of motion.     Right lower leg: 2+ Pitting Edema present.  Skin:    General: Skin is warm and dry.     Findings: Erythema present.     Comments: Does have localized erythema, warmth, and tenderness with palpation to the right medial lower leg with localized edema to the medial ankle and upper leg.  There was some mild calf tenderness with palpation.  Neurological:     General: No focal deficit present.     Mental Status: He is oriented to person, place, and time.  Psychiatric:        Mood and Affect: Mood normal.        Behavior: Behavior normal.        Thought Content: Thought content normal.        Judgment: Judgment normal.       Assessment & Plan:  1. Lower extremity edema -DVT vs cellulitis that did not respond to keflex vs Venous stasis dermatitis.  - Will order stat US and if positive start on anticoagulation . If negative trial him on Doxycycline and refer to vein and vascular surgery  - VAS [place] LOWER EXTREMITY VENOUS (DVT); Future  Shirline Frees, NP  Time spent with patient today was 31 minutes which consisted of chart  review, discussing DVT vs cellulitis , work up, treatment answering questions and documentation.

## 2023-09-16 NOTE — Patient Instructions (Signed)
It was great seeing you today   You have an ultrasound appointment tomorrow at 12:30 at the [facility]  I will follow up with you after I get the report back

## 2023-09-17 ENCOUNTER — Other Ambulatory Visit: Payer: Self-pay | Admitting: Adult Health

## 2023-09-17 ENCOUNTER — Ambulatory Visit (HOSPITAL_BASED_OUTPATIENT_CLINIC_OR_DEPARTMENT_OTHER): Payer: No Typology Code available for payment source

## 2023-09-17 DIAGNOSIS — R600 Localized edema: Secondary | ICD-10-CM | POA: Diagnosis not present

## 2023-09-17 DIAGNOSIS — L03115 Cellulitis of right lower limb: Secondary | ICD-10-CM

## 2023-09-17 MED ORDER — DOXYCYCLINE HYCLATE 100 MG PO CAPS: 100.0000 mg | ORAL_CAPSULE | Freq: Two times a day (BID) | ORAL | 0 refills | Status: DC

## 2023-09-20 ENCOUNTER — Encounter: Payer: Self-pay | Admitting: Family Medicine

## 2023-09-20 ENCOUNTER — Ambulatory Visit: Payer: No Typology Code available for payment source | Admitting: Family Medicine

## 2023-09-20 VITALS — BP 146/80 | HR 70 | Temp 98.4°F | Ht 69.0 in | Wt 308.4 lb

## 2023-09-20 DIAGNOSIS — E7800 Pure hypercholesterolemia, unspecified: Secondary | ICD-10-CM

## 2023-09-20 DIAGNOSIS — I878 Other specified disorders of veins: Secondary | ICD-10-CM | POA: Diagnosis not present

## 2023-09-20 DIAGNOSIS — I10 Essential (primary) hypertension: Secondary | ICD-10-CM | POA: Diagnosis not present

## 2023-09-20 NOTE — Patient Instructions (Signed)
Elevate legs frequently.  Consider venous compression  Lose the weight, as discussed.   Monitor home blood and be in touch if consistently > 140/90.

## 2023-09-20 NOTE — Progress Notes (Signed)
Established Patient Office Visit  Subjective   Patient ID: Hector [name], male    DOB: 20-Oct-1971  Age: 52 y.o. MRN: [id number]  Chief Complaint  Patient presents with   Medical Management of Chronic Issues   Leg Pain    Patient complains of right leg pain    HPI   Hector [name] has history of CAD, hypertension, GERD, hyperlipidemia, metabolic syndrome, panic disorder.  Back earlier this month he developed some erythema of the right leg with increased swelling.  Went to urgent care and was placed on Keflex.  Was subsequently seen here on the 19th with question of improving cellulitis versus venous stasis dermatitis.  Was sent for venous Doppler which showed no evidence for DVT.  Was placed on additional course of doxycycline.  Overall erythema some improved.  Still has some edema.  Has been referred to vein specialist and has follow-up pending.  Longstanding history of obesity.  He was followed for quite some time at the bariatric weight loss center but was eventually dismissed because of poor compliance.  He does have hypertension history treated with losartan HCTZ and metoprolol.  Denies recent chest pains.  He knows he needs to lose some weight.  Very poor diet compliance and history of high sodium diet and also several high glycemic foods.  He brings in his blood pressure cuff today to compare with ours.  Has not been checking blood pressures regularly.  Past Medical History:  Diagnosis Date   Alcohol abuse    Allergy    Anal fissure    Anxiety    Asthma    Back pain    Bilateral swelling of feet    Blood in stool    Chest pain    Coronary artery disease    Depression    Drug use    Febrile seizure (HCC)    GERD (gastroesophageal reflux disease)    Heartburn    Hyperlipidemia    Hypertension    Irritable bowel syndrome with diarrhea    Jaundice, newborn    Palpitations    Panic    Panic disorder    Pre-diabetes    Seizures (HCC)    febrile   Shortness of breath    Sleep  apnea    Past Surgical History:  Procedure Laterality Date   CARDIAC CATHETERIZATION     COLONOSCOPY  2005   CORONARY STENT INTERVENTION N/A 08/19/2020   Procedure: CORONARY STENT INTERVENTION;  Surgeon: Corky Crafts, MD;  Location: [facility] INVASIVE CV LAB;  Service: Cardiovascular;  Laterality: N/A;   CORONARY ULTRASOUND/IVUS N/A 08/19/2020   Procedure: Intravascular Ultrasound/IVUS;  Surgeon: Corky Crafts, MD;  Location: [facility] INVASIVE CV LAB;  Service: Cardiovascular;  Laterality: N/A;   HEMORRHOID BANDING     LEFT HEART CATH AND CORONARY ANGIOGRAPHY N/A 08/19/2020   Procedure: LEFT HEART CATH AND CORONARY ANGIOGRAPHY;  Surgeon: Corky Crafts, MD;  Location: [facility] INVASIVE CV LAB;  Service: Cardiovascular;  Laterality: N/A;   SIGMOIDOSCOPY     WISDOM TOOTH EXTRACTION  1991    reports that he has never smoked. He has never used smokeless tobacco. He reports current alcohol use. He reports current drug use. Drug: Marijuana. family history includes Basal cell carcinoma in his mother; Cancer in his mother; Colitis in his brother; Colon polyps in his brother and father; Dementia in his mother; Depression in his father and mother; Diabetes in his father; Heart attack in his father; Heart disease (age of onset: 89) in  his father; Hyperlipidemia in his father; Hypertension in his father and mother; Obesity in his father; Skin cancer in his mother; Sleep apnea in his father; Squamous cell carcinoma in his mother; Stroke in his father and mother. Allergies  Allergen Reactions   Other Diarrhea    GLP-1RA    Review of Systems  Constitutional:  Negative for malaise/fatigue.  Eyes:  Negative for blurred vision.  Respiratory:  Negative for shortness of breath.   Cardiovascular:  Positive for leg swelling. Negative for chest pain.  Neurological:  Negative for dizziness, weakness and headaches.      Objective:     BP (!) 146/80   Pulse 70   Temp 98.4 F (36.9 C) (Oral)   Ht 5\' 9"   (1.753 m)   Wt (!) 308 lb 6.4 oz (139.9 kg)   SpO2 99%   BMI 45.54 kg/m  BP Readings from Last 3 Encounters:  09/20/23 (!) 146/80  09/16/23 138/80  08/26/23 138/80   Wt Readings from Last 3 Encounters:  09/20/23 (!) 308 lb 6.4 oz (139.9 kg)  09/16/23 (!) 315 lb (142.9 kg)  08/26/23 (!) 306 lb (138.8 kg)      Physical Exam Vitals reviewed.  Constitutional:      Appearance: He is well-developed.  HENT:     Right Ear: External ear normal.     Left Ear: External ear normal.  Eyes:     Pupils: Pupils are equal, round, and reactive to light.  Neck:     Thyroid: No thyromegaly.  Cardiovascular:     Rate and Rhythm: Normal rate and regular rhythm.  Pulmonary:     Effort: Pulmonary effort is normal. No respiratory distress.     Breath sounds: Normal breath sounds. No wheezing or rales.  Musculoskeletal:     Cervical back: Neck supple.     Right lower leg: Edema present.     Left lower leg: Edema present.     Comments: He has some edema in both legs but right greater than left.  Has some mild erythema medial aspect of the right leg but no ulceration.  Good capillary refill throughout.  He is 2+ dorsalis pedis pulses bilaterally.  Neurological:     Mental Status: He is alert and oriented to person, place, and time.      No results found for any visits on 09/20/23.  Last CBC Lab Results  Component Value Date   WBC 6.1 03/17/2023   HGB 15.4 03/17/2023   HCT 44.2 03/17/2023   MCV 87.5 03/17/2023   MCH 30.5 09/03/2021   RDW 13.1 03/17/2023   PLT 173.0 03/17/2023   Last metabolic panel Lab Results  Component Value Date   GLUCOSE 106 (H) 03/17/2023   NA 139 03/17/2023   K 3.9 03/17/2023   CL 107 03/17/2023   CO2 22 03/17/2023   BUN 23 03/17/2023   CREATININE 1.03 03/17/2023   GFR 84.16 03/17/2023   CALCIUM 9.0 03/17/2023   PROT 6.9 03/17/2023   ALBUMIN 4.0 03/17/2023   LABGLOB 2.5 09/03/2021   AGRATIO 1.7 09/03/2021   BILITOT 0.7 03/17/2023   ALKPHOS 67  03/17/2023   AST 25 03/17/2023   ALT 30 03/17/2023   ANIONGAP 13 08/07/2020   Last lipids Lab Results  Component Value Date   CHOL 100 06/18/2023   HDL 27.80 (L) 06/18/2023   LDLCALC 46 06/18/2023   TRIG 132.0 06/18/2023   CHOLHDL 4 06/18/2023   Last hemoglobin A1c Lab Results  Component Value Date  HGBA1C 5.7 03/17/2023   Last thyroid functions Lab Results  Component Value Date   TSH 3.210 09/03/2021   T3TOTAL 163 11/16/2019      The ASCVD Risk score (Arnett DK, et al., 2019) failed to calculate for the following reasons:   The valid total cholesterol range is 130 to 320 mg/dL    Assessment & Plan:   #1 venous stasis right greater than left.  Question of recent cellulitis versus venous stasis dermatitis.  He does state this is slightly improved.  Recent Doppler negative for DVT.  Pending consult with vein specialist  -Discussed critical importance of him trying to lose some weight -Discussed compression stockings and he will discuss further with vein specialist -Elevate legs frequently -Follow-up for any recurrent erythema, fever, or chills  #2 hypertension.  Suboptimally controlled.  Repeat left arm seated after rest 140/82 which is very similar to reading with his machine of 140/87.  Monitor closely at home.  Reduce sodium intake.  We discussed additional medication versus 37-month trial of weight loss and he would prefers the latter.  Would probably try to avoid calcium channel blockers with his increased peripheral edema.  #3 hyperlipidemia.  Patient treated with atorvastatin and Zetia.  Recent LDL cholesterol 46.  Continue low saturated fat diet.  Evelena Peat, MD

## 2023-09-21 ENCOUNTER — Other Ambulatory Visit: Payer: Self-pay | Admitting: *Deleted

## 2023-09-21 DIAGNOSIS — M7989 Other specified soft tissue disorders: Secondary | ICD-10-CM

## 2023-09-23 ENCOUNTER — Ambulatory Visit (HOSPITAL_COMMUNITY)
Admission: RE | Admit: 2023-09-23 | Discharge: 2023-09-23 | Disposition: A | Discharge status: Home / Self Care | Payer: No Typology Code available for payment source | Source: Ambulatory Visit | Attending: Vascular Surgery | Admitting: Vascular Surgery

## 2023-09-23 DIAGNOSIS — M7989 Other specified soft tissue disorders: Secondary | ICD-10-CM | POA: Insufficient documentation

## 2023-09-30 ENCOUNTER — Other Ambulatory Visit: Payer: Self-pay | Admitting: Family Medicine

## 2023-10-06 ENCOUNTER — Encounter: Payer: Self-pay | Admitting: Nurse Practitioner

## 2023-10-06 ENCOUNTER — Ambulatory Visit: Payer: No Typology Code available for payment source | Admitting: Nurse Practitioner

## 2023-10-06 VITALS — BP 126/90 | HR 84 | Ht 69.0 in | Wt 311.0 lb

## 2023-10-06 DIAGNOSIS — K644 Residual hemorrhoidal skin tags: Secondary | ICD-10-CM | POA: Diagnosis not present

## 2023-10-06 DIAGNOSIS — K648 Other hemorrhoids: Secondary | ICD-10-CM | POA: Diagnosis not present

## 2023-10-06 MED ORDER — HYDROCORTISONE (PERIANAL) 2.5 % EX CREA: 1.0000 | TOPICAL_CREAM | Freq: Every day | CUTANEOUS | 0 refills | Status: DC

## 2023-10-06 NOTE — Progress Notes (Signed)
ASSESSMENT & PLAN   52 y.o.  male known to Dr. Rhea Belton with a history of CAD s/p DES in 2021 on Plavix, alcohol use disorder, anxiety, obesity, sleep apnea and IBS, hemorrhoids   Internal hemorrhoids / perianal hemorrhoid tags.  Discussed that we cannot excise the external hemorrhoid tags or band external hemorrhoids . I think the hemorrhoid tags might be bothering him the most.  However, which I think is what is on anoscopy he has very inflamed internal hemorrhoids.  --Discussed toilet hygiene. Specifically,  do not linger on toilet using cell phone.  --Daily Metamucil --60 oz water daily --Anusol cream PR and perianally q HS x 10 days --He will try to determine if it is in fact the hemorrhoid tags bothering him or ? Internal hemorrhoids. He may decide at some point to come for internal hemorrhoid banding.   Colon Cancer Screening.  10 year screening colonoscopy due Feb 2033  Nocturia I asked him to follow up with PCP about this.    HPI   Chief complaint : hemorrhoids  Hector [name] is tired of dealing with his hemorrhoids, interferes with hygiene, has perianal itching. He doesn't strain to have a BM but says he sits way too long on toilet on cell phone. Stools are not hard ,  usually formed or like "chips". Has about 3 postprandial BMs a day.   No longer has problems with rectal bleeding.   Previous GI Endoscopies / Labs / Imaging   **May not include all endoscopic evaluations   Feb 2023 Colonoscopy  - One 4 mm polyp in the transverse colon, removed with a cold snare. Resected and retrieved. - Diverticulosis in the sigmoid colon and in the descending colon. - Internal hemorrhoids. Most likely cause of intermittent red blood per rectum.  Path - polypoid fragments of large bowel mucosa.  Repeat 10 years       Latest Ref Rng & Units 03/17/2023    9:36 AM 09/03/2021   10:22 AM 12/02/2020    9:32 AM  Hepatic Function  Total Protein 6.0 - 8.3 g/dL 6.9  6.7  6.6   Albumin 3.5 - 5.2  g/dL 4.0  4.2  4.2   AST 0 - 37 U/L 25  19  16    ALT 0 - 53 U/L 30  18  18    Alk Phosphatase 39 - 117 U/L 67  96  71   Total Bilirubin 0.2 - 1.2 mg/dL 0.7  0.4  0.4   Bilirubin, Direct 0.0 - 0.3 mg/dL 0.2          Latest Ref Rng & Units 03/17/2023    9:36 AM 09/03/2021   10:22 AM 12/02/2020    9:32 AM  CBC  WBC 4.0 - 10.5 K/uL 6.1  8.3  7.0   Hemoglobin 13.0 - 17.0 g/dL 40.1  02.7  25.3   Hematocrit 39.0 - 52.0 % 44.2  42.8  44.4   Platelets 150.0 - 400.0 K/uL 173.0  158  166      Past Medical History:  Diagnosis Date   Alcohol abuse    Allergy    Anal fissure    Anxiety    Asthma    Back pain    Bilateral swelling of feet    Blood in stool    Chest pain    Coronary artery disease    Depression    Drug use    Febrile seizure (HCC)    GERD (gastroesophageal reflux disease)  Heartburn    Hyperlipidemia    Hypertension    Irritable bowel syndrome with diarrhea    Jaundice, newborn    Palpitations    Panic    Panic disorder    Pre-diabetes    Seizures (HCC)    febrile   Shortness of breath    Sleep apnea     Past Surgical History:  Procedure Laterality Date   CARDIAC CATHETERIZATION     COLONOSCOPY  2005   CORONARY STENT INTERVENTION N/A 08/19/2020   Procedure: CORONARY STENT INTERVENTION;  Surgeon: Corky Crafts, MD;  Location: [facility] INVASIVE CV LAB;  Service: Cardiovascular;  Laterality: N/A;   CORONARY ULTRASOUND/IVUS N/A 08/19/2020   Procedure: Intravascular Ultrasound/IVUS;  Surgeon: Corky Crafts, MD;  Location: [facility] INVASIVE CV LAB;  Service: Cardiovascular;  Laterality: N/A;   HEMORRHOID BANDING     LEFT HEART CATH AND CORONARY ANGIOGRAPHY N/A 08/19/2020   Procedure: LEFT HEART CATH AND CORONARY ANGIOGRAPHY;  Surgeon: Corky Crafts, MD;  Location: [facility] INVASIVE CV LAB;  Service: Cardiovascular;  Laterality: N/A;   SIGMOIDOSCOPY     WISDOM TOOTH EXTRACTION  1991    Family History  Problem Relation Age of Onset   Cancer Mother     Dementia Mother    Stroke Mother    Hypertension Mother    Skin cancer Mother    Basal cell carcinoma Mother    Squamous cell carcinoma Mother    Depression Mother    Diabetes Father    Hyperlipidemia Father    Stroke Father    Heart disease Father 44   Colon polyps Father    Heart attack Father    Hypertension Father    Depression Father    Sleep apnea Father    Obesity Father    Colitis Brother    Colon polyps Brother     Current Medications, Allergies, Family History and Social History were reviewed in [employer] electronic medical record.     Current Outpatient Medications  Medication Sig Dispense Refill   aspirin EC 81 MG tablet Take 81 mg by mouth daily. Swallow whole.     atorvastatin (LIPITOR) 40 MG tablet TAKE 1 TABLET BY MOUTH EVERY DAY 90 tablet 1   clopidogrel (PLAVIX) 75 MG tablet Take 1 tablet (75 mg total) by mouth daily. 90 tablet 3   doxycycline (VIBRAMYCIN) 100 MG capsule Take 1 capsule (100 mg total) by mouth 2 (two) times daily. 20 capsule 0   ezetimibe (ZETIA) 10 MG tablet Take 1 tablet by mouth once daily. 90 tablet 3   LORazepam (ATIVAN) 1 MG tablet Take 1 tablet (1 mg total) by mouth every 8 (eight) hours as needed. for anxiety 30 tablet 0   losartan-hydrochlorothiazide (HYZAAR) 100-12.5 MG tablet Take 1 tablet by mouth daily. 90 tablet 3   metoprolol succinate (TOPROL-XL) 25 MG 24 hr tablet TAKE 1 TABLET (25 MG TOTAL) BY MOUTH DAILY (STOP ATENOLOL) 90 tablet 3   nitroGLYCERIN (NITROSTAT) 0.4 MG SL tablet PLACE 1 TABLET UNDER THE TONGUE EVERY 5 MINUTES AS NEEDED FOR CHEST PAIN. 450 tablet 1   No current facility-administered medications for this visit.    Review of Systems: No chest pain. No shortness of breath. Positive for nocturia  Physical Exam  Wt Readings from Last 3 Encounters:  09/20/23 (!) 308 lb 6.4 oz (139.9 kg)  09/16/23 (!) 315 lb (142.9 kg)  08/26/23 (!) 306 lb (138.8 kg)    BP (!) 126/90 (BP Location: Left Arm, Patient  Position: Sitting, Cuff Size: Large)   Pulse 84   Ht 5\' 9"  (1.753 m)   Wt (!) 311 lb (141.1 kg)   BMI 45.93 kg/m  Constitutional:  Pleasant, generally well appearing male in no acute distress. Psychiatric: Normal mood and affect. Behavior is normal. Cardiovascular: Normal rate, regular rhythm.  Pulmonary/chest: Effort normal and breath sounds normal. No wheezing, rales or rhonchi. Abdominal: Soft, nondistended, nontender. Bowel sounds active throughout. There are no masses palpable. No hepatomegaly. Rectal: a couple of fleshy perianal hemorrhoid tags. On anoscopy there are inflamed internal hemorrhoids and possibly a hypertrophied anal papillae Neurological: Alert and oriented to person place and time.    Willette Cluster, NP  10/06/2023, 8:17 AM  Cc:  Kristian Covey, MD

## 2023-10-06 NOTE — Patient Instructions (Addendum)
_______________________________________________________  If your blood pressure at your visit was 140/90 or greater, please contact your primary care physician to follow up on this.  _______________________________________________________  If you are age 52 or older, your body mass index should be between 23-30. Your Body mass index is 45.93 kg/m. If this is out of the aforementioned range listed, please consider follow up with your Primary Care Provider.  If you are age 71 or younger, your body mass index should be between 19-25. Your Body mass index is 45.93 kg/m. If this is out of the aformentioned range listed, please consider follow up with your Primary Care Provider.   ________________________________________________________  The [facility] providers would like to encourage you to use [facility] to communicate with providers for non-urgent requests or questions.  Due to long hold times on the telephone, sending your provider a message by [facility] may be a faster and more efficient way to get a response.  Please allow 48 business hours for a response.  Please remember that this is for non-urgent requests.  _______________________________________________________  Please purchase the following medications over the counter and take as directed: Metamucil as directed  Please drink at least 60oz water daily.  We have sent the following medications to your pharmacy for you to pick up at your convenience: Anusol cream apply daily at night for 10 days  Please call and set follow up appointment with Dr Rhea Belton for banding when you are able to.   It was a pleasure to see you today!  Thank you for trusting me with your gastrointestinal care!

## 2023-10-07 NOTE — Progress Notes (Signed)
Addendum: Reviewed and agree with assessment and management plan. Kadijah Shamoon M, MD  

## 2023-10-12 ENCOUNTER — Ambulatory Visit: Payer: No Typology Code available for payment source

## 2023-10-12 VITALS — BP 146/84 | HR 81 | Temp 97.3°F | Resp 20 | Ht 69.0 in | Wt 310.2 lb

## 2023-10-12 DIAGNOSIS — M7989 Other specified soft tissue disorders: Secondary | ICD-10-CM | POA: Diagnosis not present

## 2023-10-12 DIAGNOSIS — I872 Venous insufficiency (chronic) (peripheral): Secondary | ICD-10-CM

## 2023-10-12 DIAGNOSIS — I890 Lymphedema, not elsewhere classified: Secondary | ICD-10-CM | POA: Diagnosis not present

## 2023-10-12 NOTE — Progress Notes (Signed)
Office Note     CC:  follow up Requesting Provider:  Shirline Frees, NP  HPI: Hector [name] is a 52 y.o. (September 23, 1971) male who presents for evaluation of bilateral lower extremity edema right worse than [name].  He denies any history of DVT, venous ulcerations, trauma, or prior vascular intervention.  He states he has been treated for cellulitis of the right leg/ankle 3 times in the past 3 years.  He has never been hospitalized for this reason however.  He does not wear compression or make an effort to elevate his legs during the day.  He plans to increase his activity level and lose weight.  He denies claudication, rest pain, or tissue loss in the feet.  His primary care provider recently ordered a bilateral lower extremity venous duplex which was negative for DVT.   Past Medical History:  Diagnosis Date   Alcohol abuse    Allergy    Anal fissure    Anxiety    Asthma    Back pain    Bilateral swelling of feet    Blood in stool    Chest pain    Coronary artery disease    Depression    Drug use    Febrile seizure (HCC)    GERD (gastroesophageal reflux disease)    Heartburn    Hyperlipidemia    Hypertension    Irritable bowel syndrome with diarrhea    Jaundice, newborn    Palpitations    Panic    Panic disorder    Pre-diabetes    Seizures (HCC)    febrile   Shortness of breath    Sleep apnea    Venous reflux     Past Surgical History:  Procedure Laterality Date   CARDIAC CATHETERIZATION     COLONOSCOPY  2005   CORONARY STENT INTERVENTION N/A 08/19/2020   Procedure: CORONARY STENT INTERVENTION;  Surgeon: Hector Crafts, MD;  Location: [facility] INVASIVE CV [facility];  Service: Cardiovascular;  Laterality: N/A;   CORONARY ULTRASOUND/IVUS N/A 08/19/2020   Procedure: Intravascular Ultrasound/IVUS;  Surgeon: Hector Crafts, MD;  Location: [facility] INVASIVE CV LAB;  Service: Cardiovascular;  Laterality: N/A;   HEMORRHOID BANDING     [name] HEART CATH AND CORONARY ANGIOGRAPHY N/A  08/19/2020   Procedure: [name] HEART CATH AND CORONARY ANGIOGRAPHY;  Surgeon: Hector Crafts, MD;  Location: [facility] INVASIVE CV LAB;  Service: Cardiovascular;  Laterality: N/A;   SIGMOIDOSCOPY     WISDOM TOOTH EXTRACTION  1991    Social History   Socioeconomic History   Marital status: Divorced    Spouse name: Not on file   Number of children: 1   Years of education: Not on file   Highest education level: Bachelor's degree (e.g., BA, AB, BS)  Occupational History   Occupation: Cytogeneticist  Tobacco Use   Smoking status: Never   Smokeless tobacco: Never  Vaping Use   Vaping status: Former  Substance and Sexual Activity   Alcohol use: Yes    Alcohol/week: 0.0 standard drinks of alcohol    Comment: 1-5 per week   Drug use: Yes    Types: Marijuana    Comment: Reports sometimes he smokes daily sometimes weekly   Sexual activity: Yes  Other Topics Concern   Not on file  Social History Narrative   Patient is right-handed. He lives in a 2 story home. He drinks 2 20 oz sodas a day. He does not exercise.   Social Determinants of Health  Financial Resource Strain: Low Risk  (06/17/2023)   Overall Financial Resource Strain (CARDIA)    Difficulty of Paying Living Expenses: Not very hard  Food Insecurity: No Food Insecurity (06/17/2023)   Hunger Vital Sign    Worried About Running Out of Food in the Last Year: Never true    Ran Out of Food in the Last Year: Never true  Transportation Needs: No Transportation Needs (06/17/2023)   PRAPARE - Administrator, Civil Service (Medical): No    Lack of Transportation (Non-Medical): No  Physical Activity: Insufficiently Active (06/17/2023)   Exercise Vital Sign    Days of Exercise per Week: 1 day    Minutes of Exercise per Session: 30 min  Stress: Stress Concern Present (06/17/2023)   [employer] of Occupational Health - Occupational Stress Questionnaire    Feeling of Stress : Rather much  Social Connections: Socially  Isolated (06/17/2023)   Social Connection and Isolation Panel [NHANES]    Frequency of Communication with Friends and Family: Once a week    Frequency of Social Gatherings with Friends and Family: Never    Attends Religious Services: Never    Database administrator or Organizations: No    Attends Engineer, structural: Not on file    Marital Status: Divorced  Catering manager Violence: Not on file    Family History  Problem Relation Age of Onset   Cancer Mother    Dementia Mother    Stroke Mother    Hypertension Mother    Skin cancer Mother    Basal cell carcinoma Mother    Squamous cell carcinoma Mother    Depression Mother    Diabetes Father    Hyperlipidemia Father    Stroke Father    Heart disease Father 32   Colon polyps Father    Heart attack Father    Hypertension Father    Depression Father    Sleep apnea Father    Obesity Father    Colitis Brother    Colon polyps Brother     Current Outpatient Medications  Medication Sig Dispense Refill   aspirin EC 81 MG tablet Take 81 mg by mouth daily. Swallow whole.     atorvastatin (LIPITOR) 40 MG tablet TAKE 1 TABLET BY MOUTH EVERY DAY 90 tablet 1   clopidogrel (PLAVIX) 75 MG tablet Take 1 tablet (75 mg total) by mouth daily. 90 tablet 3   ezetimibe (ZETIA) 10 MG tablet Take 1 tablet by mouth once daily. 90 tablet 3   hydrocortisone (ANUSOL-HC) 2.5 % rectal cream Place 1 Application rectally at bedtime. Apply internally and externally to hemorrhoids for 10 days 30 g 0   LORazepam (ATIVAN) 1 MG tablet Take 1 tablet (1 mg total) by mouth every 8 (eight) hours as needed. for anxiety 30 tablet 0   losartan-hydrochlorothiazide (HYZAAR) 100-12.5 MG tablet Take 1 tablet by mouth daily. 90 tablet 3   metoprolol succinate (TOPROL-XL) 25 MG 24 hr tablet TAKE 1 TABLET (25 MG TOTAL) BY MOUTH DAILY (STOP ATENOLOL) 90 tablet 3   nitroGLYCERIN (NITROSTAT) 0.4 MG SL tablet PLACE 1 TABLET UNDER THE TONGUE EVERY 5 MINUTES AS NEEDED  FOR CHEST PAIN. 450 tablet 1   No current facility-administered medications for this visit.    Allergies  Allergen Reactions   Other Diarrhea    GLP-1RA     REVIEW OF SYSTEMS:   [X]  denotes positive finding, [ ]  denotes negative finding Cardiac  Comments:  Chest pain or  chest pressure:    Shortness of breath upon exertion:    Short of breath when lying flat:    Irregular heart rhythm:        Vascular    Pain in calf, thigh, or hip brought on by ambulation:    Pain in feet at night that wakes you up from your sleep:     Blood clot in your veins:    Leg swelling:         Pulmonary    Oxygen at home:    Productive cough:     Wheezing:         Neurologic    Sudden weakness in arms or legs:     Sudden numbness in arms or legs:     Sudden onset of difficulty speaking or slurred speech:    Temporary loss of vision in one eye:     Problems with dizziness:         Gastrointestinal    Blood in stool:     Vomited blood:         Genitourinary    Burning when urinating:     Blood in urine:        Psychiatric    Major depression:         Hematologic    Bleeding problems:    Problems with blood clotting too easily:        Skin    Rashes or ulcers:        Constitutional    Fever or chills:      PHYSICAL EXAMINATION:  Vitals:   10/12/23 1352  BP: (!) 146/84  Pulse: 81  Resp: 20  Temp: (!) 97.3 F (36.3 C)  SpO2: 96%  Weight: (!) 310 lb 3.2 oz (140.7 kg)  Height: 5\' 9"  (1.753 m)    General:  WDWN in NAD; vital signs documented above Gait: Not observed HENT: WNL, normocephalic Pulmonary: normal non-labored breathing , without Rales, rhonchi,  wheezing Cardiac: regular HR Abdomen: soft, NT, no masses Skin: without rashes Vascular Exam/Pulses: Symmetrical DP pulses Extremities: Mild hyperpigmentation around the right ankle with indurated skin; no visible varicosities; some edema affecting the right foot and toes Musculoskeletal: no muscle wasting or  atrophy  Neurologic: A&O X 3 Psychiatric:  The pt has Normal affect.   Non-Invasive Vascular Imaging:   Right lower extremity venous reflux study negative for DVT Incompetent common femoral vein Superficial reflux noted only in the saphenofemoral junction    ASSESSMENT/PLAN:: 52 y.o. male here for evaluation of bilateral lower extremity edema right worse than [name]  Hector [name] is a 52 year old male with bilateral lower extremity edema right worse than [name].  He has been treated as an outpatient for cellulitis 3 times over the last 3 years.  He denies any occurrence of venous ulcerations.  He has also not had any history of DVT.  Right lower extremity venous reflux study was negative for DVT.  He only has mild deep and superficial venous reflux.  He currently would not be a candidate for laser saphenous vein ablation therapy.  Recommendations include daily use of 15 to 20 mmHg compression socks.  He will also try to periodically elevate his legs above the level of his heart during the day.  He will also focus on avoiding prolonged sitting and standing.  We also discussed weight loss as an effective way to improve symptoms of edema.  Edema seems to be involving his right foot which may suggest a  diagnosis of lymphedema.  At any rate the above recommendations also apply to lymphedema.  He will be followed on an as-needed basis.  We discussed not repeating this study for calendar year or at least 6 months.   Emilie Rutter, PA-C [facility] [phone]  Clinic MD:   Lenell Antu

## 2023-10-28 DIAGNOSIS — M7989 Other specified soft tissue disorders: Secondary | ICD-10-CM

## 2023-11-19 ENCOUNTER — Ambulatory Visit: Payer: No Typology Code available for payment source | Admitting: Family Medicine

## 2023-12-01 ENCOUNTER — Encounter: Payer: Self-pay | Admitting: Family Medicine

## 2023-12-01 ENCOUNTER — Ambulatory Visit: Payer: No Typology Code available for payment source | Admitting: Family Medicine

## 2023-12-01 VITALS — BP 150/104 | HR 91 | Temp 97.5°F | Ht 69.0 in | Wt 308.8 lb

## 2023-12-01 DIAGNOSIS — I10 Essential (primary) hypertension: Secondary | ICD-10-CM | POA: Diagnosis not present

## 2023-12-01 DIAGNOSIS — R197 Diarrhea, unspecified: Secondary | ICD-10-CM

## 2023-12-01 MED ORDER — LORAZEPAM 1 MG PO TABS: 1.0000 mg | ORAL_TABLET | Freq: Three times a day (TID) | ORAL | 0 refills | Status: DC | PRN

## 2023-12-01 MED ORDER — DILTIAZEM HCL ER COATED BEADS 120 MG PO CP24: 120.0000 mg | ORAL_CAPSULE | Freq: Every day | ORAL | 3 refills | Status: DC

## 2023-12-01 NOTE — Progress Notes (Signed)
Established Patient Office Visit  Subjective   Patient ID: Hector [name], male    DOB: 1971-10-12  Age: 52 y.o. MRN: [id number]  Chief Complaint  Patient presents with   Diarrhea    Patient complains of diarrhea, x1 day   Abdominal Pain    Patient complains of abdominal pain, x2 weeks     HPI   Hector [name] had been scheduled for follow-up today to reassess blood pressure.  Refer to last note for details.  He currently takes losartan/HCTZ and low-dose metoprolol.  Blood pressures have been consistently up.  We had challenged him to try to lose some weight but has not made any progress there.  Blood pressure still up today.  No recent headaches.  He does have history of CAD with coronary calcium score of 70 (basically all LAD) from previous studies.  He is on statin.  He has history of GERD but no recent active symptoms.  He has acute issue of diarrhea with severe diarrhea last night estimated 15 times.  Has had several days of diffuse abdominal cramping prior to then.  No bloody stools.  No fever.  Denies any nausea or vomiting.  No localizing abdominal pain.  Has had frequent belching over the past several days.  No recent antibiotics.  No recent travels.  No known sick contacts.  Past Medical History:  Diagnosis Date   Alcohol abuse    Allergy    Anal fissure    Anxiety    Asthma    Back pain    Bilateral swelling of feet    Blood in stool    Chest pain    Coronary artery disease    Depression    Drug use    Febrile seizure (HCC)    GERD (gastroesophageal reflux disease)    Heartburn    Hyperlipidemia    Hypertension    Irritable bowel syndrome with diarrhea    Jaundice, newborn    Palpitations    Panic    Panic disorder    Pre-diabetes    Seizures (HCC)    febrile   Shortness of breath    Sleep apnea    Venous reflux    Past Surgical History:  Procedure Laterality Date   CARDIAC CATHETERIZATION     COLONOSCOPY  2005   CORONARY STENT INTERVENTION N/A 08/19/2020    Procedure: CORONARY STENT INTERVENTION;  Surgeon: Corky Crafts, MD;  Location: [facility] INVASIVE CV LAB;  Service: Cardiovascular;  Laterality: N/A;   CORONARY ULTRASOUND/IVUS N/A 08/19/2020   Procedure: Intravascular Ultrasound/IVUS;  Surgeon: Corky Crafts, MD;  Location: [facility] INVASIVE CV LAB;  Service: Cardiovascular;  Laterality: N/A;   HEMORRHOID BANDING     LEFT HEART CATH AND CORONARY ANGIOGRAPHY N/A 08/19/2020   Procedure: LEFT HEART CATH AND CORONARY ANGIOGRAPHY;  Surgeon: Corky Crafts, MD;  Location: [facility] INVASIVE CV LAB;  Service: Cardiovascular;  Laterality: N/A;   SIGMOIDOSCOPY     WISDOM TOOTH EXTRACTION  1991    reports that he has never smoked. He has never used smokeless tobacco. He reports current alcohol use. He reports current drug use. Drug: Marijuana. family history includes Basal cell carcinoma in his mother; Cancer in his mother; Colitis in his brother; Colon polyps in his brother and father; Dementia in his mother; Depression in his father and mother; Diabetes in his father; Heart attack in his father; Heart disease (age of onset: 57) in his father; Hyperlipidemia in his father; Hypertension in his father and  mother; Obesity in his father; Skin cancer in his mother; Sleep apnea in his father; Squamous cell carcinoma in his mother; Stroke in his father and mother. Allergies  Allergen Reactions   Other Diarrhea    GLP-1RA    Review of Systems  Constitutional:  Negative for malaise/fatigue.  Eyes:  Negative for blurred vision.  Respiratory:  Negative for shortness of breath.   Cardiovascular:  Negative for chest pain.  Gastrointestinal:  Positive for diarrhea. Negative for blood in stool, melena, nausea and vomiting.  Neurological:  Negative for dizziness, weakness and headaches.      Objective:     BP (!) 150/104 (BP Location: Left Arm, Patient Position: Sitting, Cuff Size: Large)   Pulse 91   Temp (!) 97.5 F (36.4 C) (Oral)   Ht 5\' 9"  (1.753 m)    Wt (!) 308 lb 12.8 oz (140.1 kg)   SpO2 97%   BMI 45.60 kg/m  BP Readings from Last 3 Encounters:  12/01/23 (!) 150/104  10/12/23 (!) 146/84  10/06/23 (!) 126/90   Wt Readings from Last 3 Encounters:  12/01/23 (!) 308 lb 12.8 oz (140.1 kg)  10/12/23 (!) 310 lb 3.2 oz (140.7 kg)  10/06/23 (!) 311 lb (141.1 kg)      Physical Exam Vitals reviewed.  Constitutional:      Appearance: He is well-developed.  HENT:     Right Ear: External ear normal.     Left Ear: External ear normal.  Eyes:     Pupils: Pupils are equal, round, and reactive to light.  Neck:     Thyroid: No thyromegaly.  Cardiovascular:     Rate and Rhythm: Normal rate and regular rhythm.  Pulmonary:     Effort: Pulmonary effort is normal. No respiratory distress.     Breath sounds: Normal breath sounds. No wheezing or rales.  Abdominal:     Comments: Normal bowel sounds.  Soft and nontender  Musculoskeletal:     Cervical back: Neck supple.  Neurological:     Mental Status: He is alert and oriented to person, place, and time.      No results found for any visits on 12/01/23.    The ASCVD Risk score (Arnett DK, et al., 2019) failed to calculate for the following reasons:   The valid total cholesterol range is 130 to 320 mg/dL    Assessment & Plan:   #1 hypertension poorly controlled.  Patient already on losartan/HCTZ and low-dose metoprolol.  He is again challenged to try to lose some weight.  Restrict sodium intake.  Add diltiazem CD 120 mg once daily and set up follow-up in 6 to 8 weeks to reassess  #2 diarrhea.  1 day history of severe diarrhea symptoms.  No recent antibiotics or travels.  Does not any red flags such as fever, vomiting, bloody stools.  Stressed importance of electrolyte replacement and adequate hydration.  Fortunately, he has no vomiting.  Check basic metabolic panel to rule out any severe hypokalemia.  Consider short-term use of over-the-counter Imodium.  Reviewed things to watch out  for in terms of bloody stools, high fever, persistent diarrhea -Handout on appropriate diet given   Return in about 2 months (around 02/01/2024).    Evelena Peat, MD

## 2023-12-01 NOTE — Patient Instructions (Signed)
Set up 2 month follow up to reassess BP.

## 2023-12-02 ENCOUNTER — Other Ambulatory Visit: Payer: Self-pay

## 2023-12-02 ENCOUNTER — Emergency Department (HOSPITAL_BASED_OUTPATIENT_CLINIC_OR_DEPARTMENT_OTHER): Payer: No Typology Code available for payment source

## 2023-12-02 ENCOUNTER — Encounter (HOSPITAL_BASED_OUTPATIENT_CLINIC_OR_DEPARTMENT_OTHER): Payer: Self-pay

## 2023-12-02 ENCOUNTER — Emergency Department (HOSPITAL_BASED_OUTPATIENT_CLINIC_OR_DEPARTMENT_OTHER)
Admission: EM | Admit: 2023-12-02 | Discharge: 2023-12-02 | Disposition: A | Discharge status: Home / Self Care | Payer: No Typology Code available for payment source | Attending: Emergency Medicine | Admitting: Emergency Medicine

## 2023-12-02 DIAGNOSIS — N179 Acute kidney failure, unspecified: Secondary | ICD-10-CM | POA: Diagnosis not present

## 2023-12-02 DIAGNOSIS — I10 Essential (primary) hypertension: Secondary | ICD-10-CM | POA: Insufficient documentation

## 2023-12-02 DIAGNOSIS — Z1152 Encounter for screening for COVID-19: Secondary | ICD-10-CM | POA: Diagnosis not present

## 2023-12-02 DIAGNOSIS — Z7902 Long term (current) use of antithrombotics/antiplatelets: Secondary | ICD-10-CM | POA: Diagnosis not present

## 2023-12-02 DIAGNOSIS — I2510 Atherosclerotic heart disease of native coronary artery without angina pectoris: Secondary | ICD-10-CM | POA: Diagnosis not present

## 2023-12-02 DIAGNOSIS — R112 Nausea with vomiting, unspecified: Secondary | ICD-10-CM | POA: Diagnosis present

## 2023-12-02 DIAGNOSIS — K529 Noninfective gastroenteritis and colitis, unspecified: Secondary | ICD-10-CM | POA: Diagnosis not present

## 2023-12-02 DIAGNOSIS — D72829 Elevated white blood cell count, unspecified: Secondary | ICD-10-CM | POA: Diagnosis not present

## 2023-12-02 DIAGNOSIS — Z79899 Other long term (current) drug therapy: Secondary | ICD-10-CM | POA: Insufficient documentation

## 2023-12-02 DIAGNOSIS — Z7982 Long term (current) use of aspirin: Secondary | ICD-10-CM | POA: Insufficient documentation

## 2023-12-02 LAB — URINALYSIS, ROUTINE W REFLEX MICROSCOPIC
Bacteria, UA: NONE SEEN
Bilirubin Urine: NEGATIVE
Glucose, UA: NEGATIVE mg/dL
Hgb urine dipstick: NEGATIVE
Ketones, ur: NEGATIVE mg/dL
Leukocytes,Ua: NEGATIVE
Nitrite: NEGATIVE
Protein, ur: NEGATIVE mg/dL
Specific Gravity, Urine: >1.046 — ABNORMAL HIGH (ref 1.005–1.030)
pH: 5 (ref 5.0–8.0)

## 2023-12-02 LAB — COMPREHENSIVE METABOLIC PANEL
ALT: 23 U/L (ref 0–44)
AST: 17 U/L (ref 15–41)
Albumin: 4.6 g/dL (ref 3.5–5.0)
Alkaline Phosphatase: 70 U/L (ref 38–126)
Anion gap: 12 (ref 5–15)
BUN: 49 mg/dL — ABNORMAL HIGH (ref 6–20)
CO2: 22 mmol/L (ref 22–32)
Calcium: 9.6 mg/dL (ref 8.9–10.3)
Chloride: 102 mmol/L (ref 98–111)
Creatinine, Ser: 1.68 mg/dL — ABNORMAL HIGH (ref 0.61–1.24)
GFR, Estimated: 49 mL/min — ABNORMAL LOW (ref >60)
Glucose, Bld: 124 mg/dL — ABNORMAL HIGH (ref 70–99)
Potassium: 4.1 mmol/L (ref 3.5–5.1)
Sodium: 136 mmol/L (ref 135–145)
Total Bilirubin: 1 mg/dL (ref <1.2)
Total Protein: 8 g/dL (ref 6.5–8.1)

## 2023-12-02 LAB — CBC
HCT: 50.2 % (ref 39.0–52.0)
Hemoglobin: 17.5 g/dL — ABNORMAL HIGH (ref 13.0–17.0)
MCH: 30.1 pg (ref 26.0–34.0)
MCHC: 34.9 g/dL (ref 30.0–36.0)
MCV: 86.3 fL (ref 80.0–100.0)
Platelets: 230 10*3/uL (ref 150–400)
RBC: 5.82 MIL/uL — ABNORMAL HIGH (ref 4.22–5.81)
RDW: 12.5 % (ref 11.5–15.5)
WBC: 15.4 10*3/uL — ABNORMAL HIGH (ref 4.0–10.5)
nRBC: 0 % (ref 0.0–0.2)

## 2023-12-02 LAB — RESP PANEL BY RT-PCR (RSV, FLU A&B, COVID)  RVPGX2
Influenza A by PCR: NEGATIVE
Influenza B by PCR: NEGATIVE
Resp Syncytial Virus by PCR: NEGATIVE
SARS Coronavirus 2 by RT PCR: NEGATIVE

## 2023-12-02 LAB — LIPASE, BLOOD: Lipase: 31 U/L (ref 11–51)

## 2023-12-02 MED ORDER — IOHEXOL 300 MG/ML  SOLN
100.0000 mL | Freq: Once | INTRAMUSCULAR | Status: AC | PRN
  Administered 2023-12-02: 85 mL via INTRAVENOUS

## 2023-12-02 MED ORDER — SODIUM CHLORIDE 0.9 % IV BOLUS
1000.0000 mL | Freq: Once | INTRAVENOUS | Status: AC
  Administered 2023-12-02: 1000 mL via INTRAVENOUS

## 2023-12-02 MED ORDER — ONDANSETRON 4 MG PO TBDP
4.0000 mg | ORAL_TABLET | Freq: Once | ORAL | Status: AC | PRN
  Administered 2023-12-02: 4 mg via ORAL
  Filled 2023-12-02: qty 1

## 2023-12-02 NOTE — Discharge Instructions (Addendum)
You were seen in the emergency room today for gastroenteritis.  I would recommend staying well-hydrated, clear liquid diet for 2 days, alternating Pedialyte and Gatorade.  You can gradually introduce bland diet after that.  You can take Zofran as needed for nausea.  You can also use Imodium as needed for diarrhea.  As discussed please follow-up with primary care for repeat lab in 2 to 3 days to recheck your BUN, creatinine and GFR for kidney function.  If you are no longer tolerating eating and drinking please return to emergency room.

## 2023-12-02 NOTE — ED Notes (Signed)
Pt given ginger ale to PO challenge at this time.

## 2023-12-02 NOTE — ED Notes (Signed)
Pt tolerating PO challenge appropriately. Pt denies any nausea or abd discomfort/pain. EDP notified.

## 2023-12-02 NOTE — ED Triage Notes (Signed)
Pt c/o "severe NVD x3 days/ 2 nights," associated cramping, abd pain. States he's "trying to stay ahead of it w pedialyte but I just can't keep it down."

## 2023-12-02 NOTE — ED Provider Notes (Signed)
[place] EMERGENCY DEPARTMENT AT [facility] Provider Note   CSN: [id number] Arrival date & time: 12/02/23  1226     History  No chief complaint on file.   Hector [name] is a 52 y.o. male with past medical history of coronary artery disease, GERD, irritable bowel syndrome, hypertension presenting to emergency room with 3 days of nausea vomiting and diarrhea.  Patient reports that for the first 2 days he was able to tolerate eating and drinking but for the past 24 hours he has had increasing nausea not been able to keep anything down.  The first 2 days he was able to drink Pedialyte and eat a few crackers.  Patient was given Zofran and reports his nausea has resolved.  Reports abdominal pain with vomiting that is generalized and cramping.  Denies any current abdominal pain.  Reports possible sick contact with daughter.  Denies any recent travel.  Denies any chest pain, shortness of breath, headache or fevers or chills.  No recent antibiotic usage or medication change. History of alcohol use.  HPI     Home Medications Prior to Admission medications   Medication Sig Start Date End Date Taking? Authorizing Provider  nebivolol (BYSTOLIC) 10 MG tablet Take 10 mg by mouth daily. 08/31/23  Yes [provider]  aspirin EC 81 MG tablet Take 81 mg by mouth daily. Swallow whole.    [provider]  atorvastatin (LIPITOR) 40 MG tablet TAKE 1 TABLET BY MOUTH EVERY DAY 09/30/23   Burchette, Elberta Fortis, MD  clopidogrel (PLAVIX) 75 MG tablet Take 1 tablet (75 mg total) by mouth daily. 02/09/23   Gaston Islam., NP  diltiazem (CARDIZEM CD) 120 MG 24 hr capsule Take 1 capsule (120 mg total) by mouth daily. 12/01/23   Burchette, Elberta Fortis, MD  ezetimibe (ZETIA) 10 MG tablet Take 1 tablet by mouth once daily. 06/18/23   Burchette, Elberta Fortis, MD  hydrocortisone (ANUSOL-HC) 2.5 % rectal cream Place 1 Application rectally at bedtime. Apply internally and externally to hemorrhoids for 10 days  10/06/23   Meredith Pel, NP  LORazepam (ATIVAN) 1 MG tablet Take 1 tablet (1 mg total) by mouth every 8 (eight) hours as needed. for anxiety 12/01/23   Burchette, Elberta Fortis, MD  losartan-hydrochlorothiazide (HYZAAR) 100-12.5 MG tablet Take 1 tablet by mouth daily. 06/18/23   Burchette, Elberta Fortis, MD  metoprolol succinate (TOPROL-XL) 25 MG 24 hr tablet TAKE 1 TABLET (25 MG TOTAL) BY MOUTH DAILY (STOP ATENOLOL) 08/27/23   Gaston Islam., NP  nitroGLYCERIN (NITROSTAT) 0.4 MG SL tablet PLACE 1 TABLET UNDER THE TONGUE EVERY 5 MINUTES AS NEEDED FOR CHEST PAIN. 05/11/23   Shirline Frees, NP      Allergies    Other    Review of Systems   Review of Systems  Gastrointestinal:  Positive for diarrhea and vomiting.    Physical Exam Updated Vital Signs BP (!) 149/106 (BP Location: Right Arm)   Pulse (!) 115   Temp 97.9 F (36.6 C)   Resp 16   SpO2 98%  Physical Exam Vitals and nursing note reviewed.  Constitutional:      General: He is not in acute distress.    Appearance: He is not ill-appearing, toxic-appearing or diaphoretic.  HENT:     Head: Normocephalic and atraumatic.     Nose: No congestion or rhinorrhea.     Mouth/Throat:     Mouth: Mucous membranes are moist.     Comments: Moist  mucous membranes Eyes:     General: No scleral icterus.    Conjunctiva/sclera: Conjunctivae normal.  Cardiovascular:     Rate and Rhythm: Normal rate and regular rhythm.     Pulses: Normal pulses.     Heart sounds: Normal heart sounds.  Pulmonary:     Effort: Pulmonary effort is normal. No respiratory distress.     Breath sounds: Normal breath sounds.  Abdominal:     General: Abdomen is flat. Bowel sounds are normal.     Palpations: Abdomen is soft.     Tenderness: There is abdominal tenderness.     Comments: Epigastric TTP  Musculoskeletal:     Right lower leg: No edema.     Left lower leg: No edema.  Skin:    General: Skin is warm and dry.     Capillary Refill: Capillary refill takes less  than 2 seconds.     Findings: No lesion.  Neurological:     General: No focal deficit present.     Mental Status: He is alert and oriented to person, place, and time. Mental status is at baseline.     ED Results / Procedures / Treatments   Labs (all labs ordered are listed, but only abnormal results are displayed) Labs Reviewed  COMPREHENSIVE METABOLIC PANEL - Abnormal; Notable for the following components:      Result Value   Glucose, Bld 124 (*)    BUN 49 (*)    Creatinine, Ser 1.68 (*)    GFR, Estimated 49 (*)    All other components within normal limits  CBC - Abnormal; Notable for the following components:   WBC 15.4 (*)    RBC 5.82 (*)    Hemoglobin 17.5 (*)    All other components within normal limits  URINALYSIS, ROUTINE W REFLEX MICROSCOPIC - Abnormal; Notable for the following components:   Specific Gravity, Urine >1.046 (*)    All other components within normal limits  RESP PANEL BY RT-PCR (RSV, FLU A&B, COVID)  RVPGX2  LIPASE, BLOOD    EKG None  Radiology CT ABDOMEN PELVIS W CONTRAST  Result Date: 12/02/2023 CLINICAL DATA:  Nausea vomiting diarrhea epigastric pain. EXAM: CT ABDOMEN AND PELVIS WITH CONTRAST TECHNIQUE: Multidetector CT imaging of the abdomen and pelvis was performed using the standard protocol following bolus administration of intravenous contrast. RADIATION DOSE REDUCTION: This exam was performed according to the departmental dose-optimization program which includes automated exposure control, adjustment of the mA and/or kV according to patient size and/or use of iterative reconstruction technique. CONTRAST:  85mL OMNIPAQUE IOHEXOL 300 MG/ML  SOLN COMPARISON:  None Available. FINDINGS: Lower chest: Lung bases clear.  No pericardial or pleural effusion. Hepatobiliary: No focal liver abnormality is seen. No gallstones, gallbladder wall thickening, or biliary dilatation. Pancreas: Unremarkable. No pancreatic ductal dilatation or surrounding inflammatory  changes. Spleen: Normal in size without focal abnormality. Adrenals/Urinary Tract: Adrenal glands are unremarkable. Kidneys are normal, without renal calculi, focal lesion, or hydronephrosis. Bladder is unremarkable. Stomach/Bowel: Stomach is within normal limits. Appendix appears normal. Mild dilatation of loops of small bowel likely representing ileus on the basis of a nonspecific enteritis. Normal stool and air in the colon and rectosigmoid. No mucosal inflammatory process or mucosal enteric edema. Vascular/Lymphatic: No significant vascular findings are present. No enlarged abdominal or pelvic lymph nodes. Reproductive: Prostate is unremarkable. Other: No abdominal wall hernia or abnormality. No abdominopelvic ascites. Musculoskeletal: No acute or significant osseous findings. IMPRESSION: Mild small bowel dilatation likely representing ileus on the  basis of a nonspecific enteritis. No abscess or obstruction. Electronically Signed   By: Layla Maw M.D.   On: 12/02/2023 16:50    Procedures Procedures    Medications Ordered in ED Medications  ondansetron (ZOFRAN-ODT) disintegrating tablet 4 mg (4 mg Oral Given 12/02/23 1252)    ED Course/ Medical Decision Making/ A&P Clinical Course as of 12/02/23 1716  Thu Dec 02, 2023  1643 AKI  Prerenal GI illness.  CT pending [CC]    Clinical Course User Index [CC] Glyn Ade, MD                                 Medical Decision Making Amount and/or Complexity of Data Reviewed Labs: ordered. Radiology: ordered.  Risk Prescription drug management.   Gershon Mussel 52 y.o. presented today for NVD. Working DDx includes, but not limited to, gastroenteritis, colitis, SBO, appendicitis, cholecystitis, hepatobiliary pathology, gastritis, PUD, ACS, dissection, pancreatitis, nephrolithiasis, AAA, UTI, pyelonephritis.  R/o DDx: These are considered less likely than current impression due to history of present illness, physical exam, labs/imaging  findings.  Review of prior external notes: none  Pmhx: coronary artery disease, GERD, irritable bowel syndrome, hypertension   Unique Tests and My Interpretation:  Patient CBC with mild leukocytosis, hemoglobin 17.5.  Lipase 31.  Patient does not have elevated anion gap and no electrolyte abnormality.  Patient has elevated BUN and creatinine most likely representing a prerenal AKI secondary to dehydration secondary to gastroenteritis.  I will be obtaining a CT to rule out any acute abdominal pathology  Imaging: CT abd/pelvis    Problem List / ED Course / Critical interventions / Medication management  Patient reporting to emergency room with nausea vomiting diarrhea.  Respiratory panel obtained which is negative.  Patient's UA without any bacteria, no nitrates and no leukocytes.  Patient does have increased specific gravity.  Patient does have elevated BUN or creatinine which I feel is prerenal in nature.  Patient was given fluids here.  Patient's lipase 31.  Patient does have leukocytosis, hemoglobin 17.5 obtained CT imaging to rule out any acute abdominal pathology.  CT scan showing small bowel dilation.  Does not know an appendicitis.  No sign of obstruction.  No other acute abnormality noted. after medications patient's symptoms had greatly improved and he is tolerating p.o. intake.  Discussed admitting for AKI however patient declined.  Patient was given 2 L here and now tolerating p.o. intake.  Discussed close follow-up with primary care in 2 to 4 days to recheck kidney function or returning to emergency room with any new or worsening symptoms occur.  Patient expresses understanding and agreement to plan and requesting discharge.  Discussed case with ED attending Dr. Doran Durand prior to patient's discharge. I ordered medication including Zofran Reevaluation of the patient after these medicines showed that the patient resolved PO challenge:  Patients vitals assessed. Upon arrival patient is  hemodynamically stable.  I have reviewed the patients home medicines and have made adjustments as needed    Consult: none   Plan: Patient tolerating p.o. intake prior to discharge.  Patient is overall well-appearing and hemodynamically stable.  Patient understands diagnosis and agrees to plan.  Follow-up in 48 to 72 hours to recheck kidney function and ensure AKI is improving.  Return to emergency room at any time if no longer tolerating p.o. or new or worsening symptoms.          Final Clinical  Impression(s) / ED Diagnoses Final diagnoses:  Gastroenteritis  AKI (acute kidney injury) [facility])    Rx / DC Orders ED Discharge Orders     None         Smitty Knudsen, PA-C 12/03/23 0250    Glyn Ade, MD 12/06/23 1453

## 2023-12-02 NOTE — ED Notes (Signed)
Patient transported to CT 

## 2023-12-03 ENCOUNTER — Encounter: Payer: Self-pay | Admitting: Family Medicine

## 2023-12-03 DIAGNOSIS — N179 Acute kidney failure, unspecified: Secondary | ICD-10-CM

## 2023-12-04 ENCOUNTER — Other Ambulatory Visit: Payer: Self-pay | Admitting: Nurse Practitioner

## 2023-12-06 ENCOUNTER — Other Ambulatory Visit: Payer: No Typology Code available for payment source

## 2023-12-06 DIAGNOSIS — N179 Acute kidney failure, unspecified: Secondary | ICD-10-CM

## 2023-12-06 LAB — BASIC METABOLIC PANEL
BUN: 23 mg/dL (ref 6–23)
CO2: 25 meq/L (ref 19–32)
Calcium: 8.5 mg/dL (ref 8.4–10.5)
Chloride: 104 meq/L (ref 96–112)
Creatinine, Ser: 1.1 mg/dL (ref 0.40–1.50)
GFR: 77.38 mL/min (ref >60.00)
Glucose, Bld: 116 mg/dL — ABNORMAL HIGH (ref 70–99)
Potassium: 3.6 meq/L (ref 3.5–5.1)
Sodium: 140 meq/L (ref 135–145)

## 2023-12-07 ENCOUNTER — Telehealth: Payer: Self-pay | Admitting: Family Medicine

## 2023-12-07 NOTE — Telephone Encounter (Signed)
Pt would like his bloodwork result

## 2023-12-07 NOTE — Telephone Encounter (Signed)
Please see result note 

## 2024-01-02 ENCOUNTER — Other Ambulatory Visit: Payer: Self-pay | Admitting: Family Medicine

## 2024-01-28 ENCOUNTER — Ambulatory Visit: Payer: No Typology Code available for payment source | Admitting: Family Medicine

## 2024-01-28 ENCOUNTER — Encounter: Payer: Self-pay | Admitting: Family Medicine

## 2024-01-28 VITALS — BP 150/84 | HR 80 | Temp 97.8°F | Wt 312.4 lb

## 2024-01-28 DIAGNOSIS — I10 Essential (primary) hypertension: Secondary | ICD-10-CM

## 2024-01-28 NOTE — Progress Notes (Signed)
Established Patient Office Visit  Subjective   Patient ID: Hector [name], male    DOB: 26-Sep-1971  Age: 53 y.o. MRN: [id number]  No chief complaint on file.   HPI   Hector [name] is seen for follow-up regarding elevated blood pressure.  Refer to previous notes.  He was seen back in December and we added diltiazem 120 mg.  He had subsequent GI illness with some diarrhea and vomiting and developed acute kidney injury from dehydration.  His kidney function promptly improved after IV hydration.  His losartan HCTZ was held at that time.  [name] had done some reading and was concerned that the losartan had damaged his kidney and never started it back.  He does remain on metoprolol low-dose along with diltiazem 120 mg but is not taking the losartan HCTZ currently.  Currently not exercising.  Not monitoring blood pressures recently.  Past Medical History:  Diagnosis Date   Alcohol abuse    Allergy    Anal fissure    Anxiety    Asthma    Back pain    Bilateral swelling of feet    Blood in stool    Chest pain    Coronary artery disease    Depression    Drug use    Febrile seizure (HCC)    GERD (gastroesophageal reflux disease)    Heartburn    Hyperlipidemia    Hypertension    Irritable bowel syndrome with diarrhea    Jaundice, newborn    Palpitations    Panic    Panic disorder    Pre-diabetes    Seizures (HCC)    febrile   Shortness of breath    Sleep apnea    Venous reflux    Past Surgical History:  Procedure Laterality Date   CARDIAC CATHETERIZATION     COLONOSCOPY  2005   CORONARY STENT INTERVENTION N/A 08/19/2020   Procedure: CORONARY STENT INTERVENTION;  Surgeon: Corky Crafts, MD;  Location: [facility] INVASIVE CV LAB;  Service: Cardiovascular;  Laterality: N/A;   CORONARY ULTRASOUND/IVUS N/A 08/19/2020   Procedure: Intravascular Ultrasound/IVUS;  Surgeon: Corky Crafts, MD;  Location: [facility] INVASIVE CV LAB;  Service: Cardiovascular;  Laterality: N/A;   HEMORRHOID BANDING      LEFT HEART CATH AND CORONARY ANGIOGRAPHY N/A 08/19/2020   Procedure: LEFT HEART CATH AND CORONARY ANGIOGRAPHY;  Surgeon: Corky Crafts, MD;  Location: [facility] INVASIVE CV LAB;  Service: Cardiovascular;  Laterality: N/A;   SIGMOIDOSCOPY     WISDOM TOOTH EXTRACTION  1991    reports that he has never smoked. He has never used smokeless tobacco. He reports current alcohol use. He reports current drug use. Drug: Marijuana. family history includes Basal cell carcinoma in his mother; Cancer in his mother; Colitis in his brother; Colon polyps in his brother and father; Dementia in his mother; Depression in his father and mother; Diabetes in his father; Heart attack in his father; Heart disease (age of onset: 42) in his father; Hyperlipidemia in his father; Hypertension in his father and mother; Obesity in his father; Skin cancer in his mother; Sleep apnea in his father; Squamous cell carcinoma in his mother; Stroke in his father and mother. Allergies  Allergen Reactions   Other Diarrhea    GLP-1RA    Review of Systems  Constitutional:  Negative for malaise/fatigue.  Eyes:  Negative for blurred vision.  Respiratory:  Negative for shortness of breath.   Cardiovascular:  Negative for chest pain.  Neurological:  Negative for  dizziness, weakness and headaches.      Objective:     BP (!) 150/84 (BP Location: Left Arm, Patient Position: Sitting, Cuff Size: Large)   Pulse 80   Temp 97.8 F (36.6 C) (Oral)   Wt (!) 312 lb 6.4 oz (141.7 kg)   SpO2 98%   BMI 46.13 kg/m  BP Readings from Last 3 Encounters:  01/28/24 (!) 150/84  12/02/23 133/82  12/01/23 (!) 150/104   Wt Readings from Last 3 Encounters:  01/28/24 (!) 312 lb 6.4 oz (141.7 kg)  12/01/23 (!) 308 lb 12.8 oz (140.1 kg)  10/12/23 (!) 310 lb 3.2 oz (140.7 kg)      Physical Exam Vitals reviewed.  Constitutional:      General: He is not in acute distress.    Appearance: He is well-developed. He is not ill-appearing.  Eyes:      Pupils: Pupils are equal, round, and reactive to light.  Neck:     Thyroid: No thyromegaly.  Cardiovascular:     Rate and Rhythm: Normal rate and regular rhythm.  Pulmonary:     Effort: Pulmonary effort is normal. No respiratory distress.     Breath sounds: Normal breath sounds. No wheezing or rales.  Musculoskeletal:     Cervical back: Neck supple.  Neurological:     Mental Status: He is alert and oriented to person, place, and time.      No results found for any visits on 01/28/24.  Last CBC Lab Results  Component Value Date   WBC 15.4 (H) 12/02/2023   HGB 17.5 (H) 12/02/2023   HCT 50.2 12/02/2023   MCV 86.3 12/02/2023   MCH 30.1 12/02/2023   RDW 12.5 12/02/2023   PLT 230 12/02/2023   Last metabolic panel Lab Results  Component Value Date   GLUCOSE 116 (H) 12/06/2023   NA 140 12/06/2023   K 3.6 12/06/2023   CL 104 12/06/2023   CO2 25 12/06/2023   BUN 23 12/06/2023   CREATININE 1.10 12/06/2023   GFR 77.38 12/06/2023   CALCIUM 8.5 12/06/2023   PROT 8.0 12/02/2023   ALBUMIN 4.6 12/02/2023   LABGLOB 2.5 09/03/2021   AGRATIO 1.7 09/03/2021   BILITOT 1.0 12/02/2023   ALKPHOS 70 12/02/2023   AST 17 12/02/2023   ALT 23 12/02/2023   ANIONGAP 12 12/02/2023   Last lipids Lab Results  Component Value Date   CHOL 100 06/18/2023   HDL 27.80 (L) 06/18/2023   LDLCALC 46 06/18/2023   TRIG 132.0 06/18/2023   CHOLHDL 4 06/18/2023      The ASCVD Risk score (Arnett DK, et al., 2019) failed to calculate for the following reasons:   The valid total cholesterol range is 130 to 320 mg/dL    Assessment & Plan:   Hypertension.  Poorly controlled.  Patient never started back losartan HCTZ which likely explains why his blood pressure still up today.  He does remain on low-dose diltiazem and metoprolol.  Discussed nonpharmacologic management especially emphasized importance of weight loss and establishing more consistent exercise.  Keep sodium intake down.  Start back  losartan HCTZ.  Set up 1 month follow-up.  Recheck basic metabolic panel at follow-up.  He had concerns whether losartan was causing kidney damage.  We explained that poorly controlled hypertension can cause kidney damage and that his acute kidney injury recently was related to dehydration-and that temporary discontinuation of losartan was appropriate in that setting but he should start back at this point.Evelena Peat, MD

## 2024-01-28 NOTE — Patient Instructions (Addendum)
Get back on the Losartan hydrochlorothiazide.    Set up one month follow up.     Try to lose some weight.

## 2024-02-01 ENCOUNTER — Ambulatory Visit: Payer: No Typology Code available for payment source | Admitting: Family Medicine

## 2024-02-21 ENCOUNTER — Other Ambulatory Visit: Payer: Self-pay | Admitting: Family Medicine

## 2024-02-25 ENCOUNTER — Ambulatory Visit: Payer: No Typology Code available for payment source | Admitting: Family Medicine

## 2024-03-08 ENCOUNTER — Encounter: Payer: Self-pay | Admitting: Family Medicine

## 2024-03-08 ENCOUNTER — Ambulatory Visit: Payer: No Typology Code available for payment source | Admitting: Family Medicine

## 2024-03-08 VITALS — BP 160/96 | HR 69 | Temp 97.3°F | Wt 309.6 lb

## 2024-03-08 DIAGNOSIS — I10 Essential (primary) hypertension: Secondary | ICD-10-CM | POA: Diagnosis not present

## 2024-03-08 DIAGNOSIS — I2510 Atherosclerotic heart disease of native coronary artery without angina pectoris: Secondary | ICD-10-CM | POA: Diagnosis not present

## 2024-03-08 DIAGNOSIS — E7800 Pure hypercholesterolemia, unspecified: Secondary | ICD-10-CM

## 2024-03-08 NOTE — Patient Instructions (Signed)
 Increase the Losartan hydrochlorothiazide to one whole tablet daily  Try to lose more weight  Establish more consistent exercise.

## 2024-03-08 NOTE — Progress Notes (Signed)
 Established Patient Office Visit  Subjective   Patient ID: Hector [name], male    DOB: 02-20-1971  Age: 53 y.o. MRN: [id number]  Chief Complaint  Patient presents with   Medical Management of Chronic Issues    HPI   Lael is seen for medical follow-up.  He has history of CAD, hypertension, obesity, hyperlipidemia, metabolic syndrome.  Blood pressure was up last visit and he had not been taking his losartan HCTZ.  He had concerns that this may calls "kidney failure ".  We convinced him to go back on losartan HCTZ but unfortunately he only started back 1/2 tablet daily.  He is taking his diltiazem and metoprolol regularly.  History of poor dietary compliance.  He has lost a few pounds from last visit.  Not exercising regularly.  Has had some sleep apnea in the past but apparently did not tolerate mouthguard or CPAP.  Denies any recent chest pains.  No peripheral edema.  Does take high-dose statin for hyperlipidemia and had lipids last June with LDL cholesterol 46.  He attended weight management clinic for some time but did not have much success.  Knows he needs to lose more weight.  Past Medical History:  Diagnosis Date   Alcohol abuse    Allergy    Anal fissure    Anxiety    Asthma    Back pain    Bilateral swelling of feet    Blood in stool    Chest pain    Coronary artery disease    Depression    Drug use    Febrile seizure (HCC)    GERD (gastroesophageal reflux disease)    Heartburn    Hyperlipidemia    Hypertension    Irritable bowel syndrome with diarrhea    Jaundice, newborn    Palpitations    Panic    Panic disorder    Pre-diabetes    Seizures (HCC)    febrile   Shortness of breath    Sleep apnea    Venous reflux    Past Surgical History:  Procedure Laterality Date   CARDIAC CATHETERIZATION     COLONOSCOPY  2005   CORONARY STENT INTERVENTION N/A 08/19/2020   Procedure: CORONARY STENT INTERVENTION;  Surgeon: Corky Crafts, MD;  Location: [facility] INVASIVE  CV LAB;  Service: Cardiovascular;  Laterality: N/A;   CORONARY ULTRASOUND/IVUS N/A 08/19/2020   Procedure: Intravascular Ultrasound/IVUS;  Surgeon: Corky Crafts, MD;  Location: [facility] INVASIVE CV LAB;  Service: Cardiovascular;  Laterality: N/A;   HEMORRHOID BANDING     LEFT HEART CATH AND CORONARY ANGIOGRAPHY N/A 08/19/2020   Procedure: LEFT HEART CATH AND CORONARY ANGIOGRAPHY;  Surgeon: Corky Crafts, MD;  Location: [facility] INVASIVE CV LAB;  Service: Cardiovascular;  Laterality: N/A;   SIGMOIDOSCOPY     WISDOM TOOTH EXTRACTION  1991    reports that he has never smoked. He has never used smokeless tobacco. He reports current alcohol use. He reports current drug use. Drug: Marijuana. family history includes Basal cell carcinoma in his mother; Cancer in his mother; Colitis in his brother; Colon polyps in his brother and father; Dementia in his mother; Depression in his father and mother; Diabetes in his father; Heart attack in his father; Heart disease (age of onset: 47) in his father; Hyperlipidemia in his father; Hypertension in his father and mother; Obesity in his father; Skin cancer in his mother; Sleep apnea in his father; Squamous cell carcinoma in his mother; Stroke in his father and  mother. Allergies  Allergen Reactions   Other Diarrhea    GLP-1RA    Review of Systems  Eyes:  Negative for blurred vision.  Respiratory:  Negative for shortness of breath.   Cardiovascular:  Negative for chest pain.  Gastrointestinal:  Negative for abdominal pain.  Neurological:  Negative for dizziness, weakness and headaches.      Objective:     BP (!) 160/96 (BP Location: Left Arm, Patient Position: Sitting, Cuff Size: Large)   Pulse 69   Temp (!) 97.3 F (36.3 C) (Oral)   Wt (!) 309 lb 9.6 oz (140.4 kg)   SpO2 98%   BMI 45.72 kg/m  BP Readings from Last 3 Encounters:  03/08/24 (!) 160/96  01/28/24 (!) 150/84  12/02/23 133/82   Wt Readings from Last 3 Encounters:  03/08/24 (!) 309  lb 9.6 oz (140.4 kg)  01/28/24 (!) 312 lb 6.4 oz (141.7 kg)  12/01/23 (!) 308 lb 12.8 oz (140.1 kg)      Physical Exam Vitals reviewed.  Constitutional:      Appearance: He is well-developed. He is obese.  HENT:     Right Ear: External ear normal.     Left Ear: External ear normal.  Eyes:     Pupils: Pupils are equal, round, and reactive to light.  Neck:     Thyroid: No thyromegaly.  Cardiovascular:     Rate and Rhythm: Normal rate and regular rhythm.  Pulmonary:     Effort: Pulmonary effort is normal. No respiratory distress.     Breath sounds: Normal breath sounds. No wheezing or rales.  Musculoskeletal:     Cervical back: Neck supple.     Right lower leg: No edema.     Left lower leg: No edema.  Neurological:     Mental Status: He is alert and oriented to person, place, and time.      No results found for any visits on 03/08/24.  Last CBC Lab Results  Component Value Date   WBC 15.4 (H) 12/02/2023   HGB 17.5 (H) 12/02/2023   HCT 50.2 12/02/2023   MCV 86.3 12/02/2023   MCH 30.1 12/02/2023   RDW 12.5 12/02/2023   PLT 230 12/02/2023   Last metabolic panel Lab Results  Component Value Date   GLUCOSE 116 (H) 12/06/2023   NA 140 12/06/2023   K 3.6 12/06/2023   CL 104 12/06/2023   CO2 25 12/06/2023   BUN 23 12/06/2023   CREATININE 1.10 12/06/2023   GFR 77.38 12/06/2023   CALCIUM 8.5 12/06/2023   PROT 8.0 12/02/2023   ALBUMIN 4.6 12/02/2023   LABGLOB 2.5 09/03/2021   AGRATIO 1.7 09/03/2021   BILITOT 1.0 12/02/2023   ALKPHOS 70 12/02/2023   AST 17 12/02/2023   ALT 23 12/02/2023   ANIONGAP 12 12/02/2023   Last lipids Lab Results  Component Value Date   CHOL 100 06/18/2023   HDL 27.80 (L) 06/18/2023   LDLCALC 46 06/18/2023   TRIG 132.0 06/18/2023   CHOLHDL 4 06/18/2023   Last hemoglobin A1c Lab Results  Component Value Date   HGBA1C 5.7 03/17/2023   Last thyroid functions Lab Results  Component Value Date   TSH 3.210 09/03/2021   T3TOTAL  163 11/16/2019      The ASCVD Risk score (Arnett DK, et al., 2019) failed to calculate for the following reasons:   The valid total cholesterol range is 130 to 320 mg/dL    Assessment & Plan:   #1 hypertension.  Poorly controlled.  Patient has only been taking one half of his losartan HCTZ.  He does remain on diltiazem and metoprolol.  We discussed several items as follows  -Stressed importance of weight loss -Stressed importance of trying to keep daily sodium intake less than 2000 mg.  He is not currently monitoring this. -Increase losartan HCTZ up to 1 full tablet daily -Continue current dose of diltiazem and metoprolol.  May need to bump his diltiazem dose. -Bring home blood pressure readings back at follow-up to assess -Would probably benefit from more effective treatment of his sleep apnea but he has been intolerant of dental appliance and CPAP in the past.  #2 hyperlipidemia with history of CAD.  On atorvastatin and Zetia.  Continue low saturated fat diet.  Recommend repeat lipids and chemistries at follow-up  Evelena Peat, MD

## 2024-04-19 ENCOUNTER — Ambulatory Visit: Admitting: Family Medicine

## 2024-05-03 ENCOUNTER — Encounter: Payer: Self-pay | Admitting: Family Medicine

## 2024-05-03 ENCOUNTER — Ambulatory Visit: Admitting: Family Medicine

## 2024-05-03 VITALS — BP 138/64 | HR 75 | Temp 98.0°F | Wt 309.0 lb

## 2024-05-03 DIAGNOSIS — E8881 Metabolic syndrome: Secondary | ICD-10-CM

## 2024-05-03 DIAGNOSIS — I10 Essential (primary) hypertension: Secondary | ICD-10-CM | POA: Diagnosis not present

## 2024-05-03 DIAGNOSIS — Z125 Encounter for screening for malignant neoplasm of prostate: Secondary | ICD-10-CM | POA: Diagnosis not present

## 2024-05-03 DIAGNOSIS — E785 Hyperlipidemia, unspecified: Secondary | ICD-10-CM

## 2024-05-03 MED ORDER — LORAZEPAM 1 MG PO TABS: 1.0000 mg | ORAL_TABLET | Freq: Three times a day (TID) | ORAL | 0 refills | Status: DC | PRN

## 2024-05-03 NOTE — Progress Notes (Signed)
 Established Patient Office Visit  Subjective   Patient ID: Hector [name], male    DOB: 12-19-1971  Age: 53 y.o. MRN: [id number]  No chief complaint on file.   HPI   Hector [name] has history of CAD, hypertension, metabolic syndrome, morbid obesity, chronic anxiety.  He also has obstructive sleep apnea but intolerant of CPAP.  He is considering possibly looking at inspire.  Recent elevated blood pressure but he was not taking his full dosage of blood pressure medication.  Is now back on full tablet of losartan  HCTZ along with diltiazem  and metoprolol  regularly.  Unfortunately, has not lost any weight but blood pressure much improved today.  Denies any recent chest pains.  No consistent exercise.  He remains on Zetia  and atorvastatin  for hyperlipidemia.  Due for follow-up labs soon.  He is compliant with his medications.  He has been referred previously to medical weight management but did not have any success there with any weight loss.  Currently working mostly with portion control.  He has history of anxiety and panic disorder.  Infrequently takes lorazepam  for severe anxiety.  Requesting refill.  Last refill was in December.  We tried SSRIs previously.  Past Medical History:  Diagnosis Date   Alcohol abuse    Allergy    Anal fissure    Anxiety    Asthma    Back pain    Bilateral swelling of feet    Blood in stool    Chest pain    Coronary artery disease    Depression    Drug use    Febrile seizure (HCC)    GERD (gastroesophageal reflux disease)    Heartburn    Hyperlipidemia    Hypertension    Irritable bowel syndrome with diarrhea    Jaundice, newborn    Palpitations    Panic    Panic disorder    Pre-diabetes    Seizures (HCC)    febrile   Shortness of breath    Sleep apnea    Venous reflux    Past Surgical History:  Procedure Laterality Date   CARDIAC CATHETERIZATION     COLONOSCOPY  2005   CORONARY STENT INTERVENTION N/A 08/19/2020   Procedure: CORONARY STENT  INTERVENTION;  Surgeon: Lucendia Rusk, MD;  Location: [facility] INVASIVE CV LAB;  Service: Cardiovascular;  Laterality: N/A;   CORONARY ULTRASOUND/IVUS N/A 08/19/2020   Procedure: Intravascular Ultrasound/IVUS;  Surgeon: Lucendia Rusk, MD;  Location: [facility] INVASIVE CV LAB;  Service: Cardiovascular;  Laterality: N/A;   HEMORRHOID BANDING     LEFT HEART CATH AND CORONARY ANGIOGRAPHY N/A 08/19/2020   Procedure: LEFT HEART CATH AND CORONARY ANGIOGRAPHY;  Surgeon: Lucendia Rusk, MD;  Location: [facility] INVASIVE CV LAB;  Service: Cardiovascular;  Laterality: N/A;   SIGMOIDOSCOPY     WISDOM TOOTH EXTRACTION  1991    reports that he has never smoked. He has never used smokeless tobacco. He reports current alcohol use. He reports current drug use. Drug: Marijuana. family history includes Basal cell carcinoma in his mother; Cancer in his mother; Colitis in his brother; Colon polyps in his brother and father; Dementia in his mother; Depression in his father and mother; Diabetes in his father; Heart attack in his father; Heart disease (age of onset: 1) in his father; Hyperlipidemia in his father; Hypertension in his father and mother; Obesity in his father; Skin cancer in his mother; Sleep apnea in his father; Squamous cell carcinoma in his mother; Stroke in his father and  mother. Allergies  Allergen Reactions   Other Diarrhea    GLP-1RA    Review of Systems  Eyes:  Negative for blurred vision.  Respiratory:  Negative for shortness of breath.   Cardiovascular:  Negative for chest pain.  Gastrointestinal:  Negative for abdominal pain.  Neurological:  Negative for dizziness, weakness and headaches.      Objective:     BP 138/64 (BP Location: Left Arm, Cuff Size: Large)   Pulse 75   Temp 98 F (36.7 C) (Oral)   Wt (!) 309 lb (140.2 kg)   SpO2 96%   BMI 45.63 kg/m  BP Readings from Last 3 Encounters:  05/03/24 138/64  03/08/24 (!) 160/96  01/28/24 (!) 150/84   Wt Readings from Last 3  Encounters:  05/03/24 (!) 309 lb (140.2 kg)  03/08/24 (!) 309 lb 9.6 oz (140.4 kg)  01/28/24 (!) 312 lb 6.4 oz (141.7 kg)      Physical Exam Vitals reviewed.  Constitutional:      Appearance: He is well-developed.  Eyes:     Pupils: Pupils are equal, round, and reactive to light.  Neck:     Thyroid : No thyromegaly.  Cardiovascular:     Rate and Rhythm: Normal rate and regular rhythm.  Pulmonary:     Effort: Pulmonary effort is normal. No respiratory distress.     Breath sounds: Normal breath sounds. No wheezing or rales.  Musculoskeletal:     Cervical back: Neck supple.     Right lower leg: No edema.     Left lower leg: No edema.  Neurological:     Mental Status: He is alert and oriented to person, place, and time.      No results found for any visits on 05/03/24.    The ASCVD Risk score (Arnett DK, et al., 2019) failed to calculate for the following reasons:   The valid total cholesterol range is 130 to 320 mg/dL    Assessment & Plan:   Problem List Items Addressed This Visit       Unprioritized   Metabolic syndrome   Relevant Orders   CMP   Hemoglobin A1c   Hypertension - Primary   Relevant Orders   CMP   Other Visit Diagnoses       Hyperlipidemia, unspecified hyperlipidemia type       Relevant Orders   Lipid panel     Prostate cancer screening       Relevant Orders   PSA     Hypertension improved.  Continue current blood pressure medications.  Continue low-sodium diet.  He is again challenged to try to lose some weight and establish more consistent exercise.  Suspect his blood pressure and perhaps weight would also improve significantly with treatment of his sleep apnea.  He is looking at possible Inspire as he did not tolerate CPAP.  -Future lab order placed for lipid, CMP, A1c  -Patient also requesting follow-up PSA as last 1 was little over a year ago.  -Refilled lorazepam  to use infrequently only for severe anxiety symptoms.  -Discussed  weight loss strategies in some detail.  -Set up  Return in about 3 months (around 08/03/2024).    Glean Lamy, MD

## 2024-05-10 ENCOUNTER — Other Ambulatory Visit

## 2024-05-19 ENCOUNTER — Other Ambulatory Visit: Payer: Self-pay | Admitting: Family Medicine

## 2024-06-15 ENCOUNTER — Other Ambulatory Visit: Payer: Self-pay | Admitting: Family Medicine

## 2024-06-20 ENCOUNTER — Other Ambulatory Visit: Payer: Self-pay

## 2024-06-20 MED ORDER — CLOPIDOGREL BISULFATE 75 MG PO TABS: 75.0000 mg | ORAL_TABLET | Freq: Every day | ORAL | 1 refills | Status: DC

## 2024-07-16 ENCOUNTER — Other Ambulatory Visit: Payer: Self-pay | Admitting: Family Medicine

## 2024-07-24 ENCOUNTER — Telehealth: Payer: Self-pay | Admitting: Nurse Practitioner

## 2024-07-24 NOTE — Telephone Encounter (Signed)
 Spoke with the patient who states that over the past couple of weeks he has had chest pain. He states that it started out as a dull ache and turned into more of a burning sensation in his chest. He reports that it comes and goes. He is not currently having any active chest pain.  He states that he did take 1 nitroglycerin  over the weekend with some relief in his chest pain. He states that he has noticed that he has been getting more short of breath with minimal exertion such as doing chores around his house. Patient has been scheduled for a DOD appointment on 7/29 and advised on ER precautions in the meantime. Patient was previously seeing Dr. Dann and has not reestablished with a new cardiologist. Has been seen by Jackee Alberts, NP most recently.

## 2024-07-24 NOTE — Telephone Encounter (Signed)
   Pt c/o of Chest Pain: STAT if active CP, including tightness, pressure, jaw pain, radiating pain to shoulder/upper arm/back, CP unrelieved by Nitro. Symptoms reported of SOB, nausea, vomiting, sweating.  1. Are you having CP right now?     2. Are you experiencing any other symptoms (ex. SOB, nausea, vomiting, sweating)?    3. Is your CP continuous or coming and going?    4. Have you taken Nitroglycerin ?    5. How long have you been experiencing CP?     6. If NO CP at time of call then end call with telling Pt to call back or call 911 if Chest pain returns prior to return call from triage team.   Patient sent message to scheduling, nothing available with an APP and new providers are not available until September for scheduling. Should he be seen on DOD?  Been having pain/tightness in my chest, it seems to come and go, not necessarily tied to exertion. (I don't notice that it comes on with activity and goes away at rest.) Also noticed persistent increased shortness of breath, and a few days ago, I had to spend a few minutes doing breathing exercises to be able to fully fill my lungs. I have usually been seeing Cardiology in Nov/Dec, but in light of these symptoms, I want to come in sooner rather than later.

## 2024-07-25 ENCOUNTER — Encounter: Payer: Self-pay | Admitting: Cardiology

## 2024-07-25 ENCOUNTER — Ambulatory Visit: Attending: Cardiology | Admitting: Cardiology

## 2024-07-25 VITALS — BP 148/89 | HR 73 | Resp 16 | Ht 69.0 in | Wt 311.0 lb

## 2024-07-25 DIAGNOSIS — Z955 Presence of coronary angioplasty implant and graft: Secondary | ICD-10-CM

## 2024-07-25 DIAGNOSIS — Z6841 Body Mass Index (BMI) 40.0 and over, adult: Secondary | ICD-10-CM

## 2024-07-25 DIAGNOSIS — I10 Essential (primary) hypertension: Secondary | ICD-10-CM

## 2024-07-25 DIAGNOSIS — I25118 Atherosclerotic heart disease of native coronary artery with other forms of angina pectoris: Secondary | ICD-10-CM

## 2024-07-25 DIAGNOSIS — E66813 Obesity, class 3: Secondary | ICD-10-CM

## 2024-07-25 DIAGNOSIS — R072 Precordial pain: Secondary | ICD-10-CM

## 2024-07-25 DIAGNOSIS — R0602 Shortness of breath: Secondary | ICD-10-CM | POA: Diagnosis not present

## 2024-07-25 DIAGNOSIS — E782 Mixed hyperlipidemia: Secondary | ICD-10-CM

## 2024-07-25 MED ORDER — ISOSORBIDE MONONITRATE ER 30 MG PO TB24
30.0000 mg | ORAL_TABLET | Freq: Every day | ORAL | 3 refills | Status: DC
Start: 2024-07-25 — End: 2024-10-23

## 2024-07-25 NOTE — Progress Notes (Signed)
 Cardiology Office Note:  .   Date:  07/25/2024  ID:  Hector [name], DOB 02-Aug-1971, MRN [id number] PCP:  Micheal Wolm ORN, MD  Former Cardiology Providers: Dr. Candyce Reek [facility] Providers Cardiologist:  Madonna Large, DO , [facility] (established care 07/25/24) Electrophysiologist:  None  Click to update primary MD,subspecialty MD or APP then REFRESH:1}    Chief Complaint  Patient presents with   Chest Pain   Shortness of Breath   Follow-up    History of Present Illness: .   Hector [name] is a 53 y.o. Caucasian male whose past medical history and cardiovascular risk factors includes: CAD s/p DES to mid LAD 2021, HTN, HLD, morbid obesity, GERD, sleep apnea.  Formally under the care of Dr. Candyce Reek who last saw Hector [name] back in November 2022. I am seeing him for the first time to re-establishing care.   Patient has known history of coronary artery disease dating back to his 2021.  He underwent left heart catheterization which noted 95% mid LAD disease status post DES x 1.  He was started on dual antiplatelet therapy.  Patient was seen by Jackee Alberts back in August 2024 patient did endorse precordial discomfort at that time.  He was recommended to start using sublingual nitroglycerin  tablets on as needed basis.  Atenolol was changed to metoprolol  succinate.  He was also educated on importance of weight loss and [employer] prep program.  He presents today for 1 year follow-up visit.  Patient endorses precordial chest pain. Ongoing for the last 2 weeks. Substernal and left-sided. Dull/burning-like sensation. Intermittent. Gets short of breath with effort related activities more quicker when compared to last visit. Also endorses bendopnea. Has to take frequent breaks to do his house chores. Despite being morbidly obese his weight has not gone up since last office visit to explain the symptoms. He symptoms are not as anginal equivalents which he experienced back in  2021. Has taken sublingual nitroglycerin  tablet once with improvement in symptoms.  Patient does not check his blood pressures at home. Patient states that he has tried GLP 1 agonist to help facilitate weight loss but cannot tolerate the GI side effects.   Review of Systems: .   Review of Systems  Cardiovascular:  Positive for chest pain and dyspnea on exertion. Negative for claudication, irregular heartbeat, leg swelling, near-syncope, orthopnea, palpitations, paroxysmal nocturnal dyspnea and syncope.  Respiratory:  Negative for shortness of breath.   Hematologic/Lymphatic: Negative for bleeding problem.    Studies Reviewed:   EKG: EKG Interpretation Date/Time:  Tuesday July 25 2024 15:55:41 EDT Ventricular Rate:  64 PR Interval:  192 QRS Duration:  84 QT Interval:  422 QTC Calculation: 435 R Axis:   61  Text Interpretation: Normal sinus rhythm When compared with ECG of 26-Aug-2023 15:08, Premature ventricular complexes are no longer Present Confirmed by Large Madonna ([id number]) on 07/25/2024 4:23:53 PM  Echocardiogram: December 2023 1. Technically difficult study with limited echo windows, grossly normal  LVEF with Definity  contrast   2. Left ventricular ejection fraction, by estimation, is 65 to 70%. The  left ventricle has normal function. The left ventricle has no regional  wall motion abnormalities. Left ventricular diastolic parameters are  consistent with Grade I diastolic  dysfunction (impaired relaxation).   3. Right ventricular systolic function is normal. The right ventricular  size is normal. Tricuspid regurgitation signal is inadequate for assessing  PA pressure.   4. The mitral valve was not well visualized. No  evidence of mitral valve  regurgitation.   5. The aortic valve was not well visualized. Aortic valve regurgitation  is not visualized.   6. The inferior vena cava is normal in size with greater than 50%  respiratory variability, suggesting right atrial  pressure of 3 mmHg.   Stress Testing: December 2023 Myocardial perfusion imaging: Low risk study  Left heart catheterization: August 19, 2020 Mid LAD lesion is 95% stenosed. Right to left collaterals. A drug-eluting stent was successfully placed using a STENT RESOLUTE ONYX 3.0X30, postdilated to > 4 mm in the mid and proximal stent and optimized with IVUS. Post intervention, there is a 0% residual stenosis. Prox RCA lesion is 30% stenosed. Minimal circumflex disease. The left ventricular systolic function is normal. LV end diastolic pressure is normal. The left ventricular ejection fraction is 55-65% by visual estimate. There is no aortic valve stenosis. Prox LAD lesion is 20% stenosed.    RADIOLOGY: NA  Risk Assessment/Calculations:   NA   Labs:       Latest Ref Rng & Units 12/02/2023   12:53 PM 03/17/2023    9:36 AM 09/03/2021   10:22 AM  CBC  WBC 4.0 - 10.5 K/uL 15.4  6.1  8.3   Hemoglobin 13.0 - 17.0 g/dL 82.4  84.5  85.3   Hematocrit 39.0 - 52.0 % 50.2  44.2  42.8   Platelets 150 - 400 K/uL 230  173.0  158        Latest Ref Rng & Units 12/06/2023    9:08 AM 12/02/2023   12:53 PM 03/17/2023    9:36 AM  BMP  Glucose 70 - 99 mg/dL 883  875  893   BUN 6 - 23 mg/dL 23  49  23   Creatinine 0.40 - 1.50 mg/dL 8.89  8.31  8.96   Sodium 135 - 145 mEq/L 140  136  139   Potassium 3.5 - 5.1 mEq/L 3.6  4.1  3.9   Chloride 96 - 112 mEq/L 104  102  107   CO2 19 - 32 mEq/L 25  22  22    Calcium  8.4 - 10.5 mg/dL 8.5  9.6  9.0       Latest Ref Rng & Units 12/06/2023    9:08 AM 12/02/2023   12:53 PM 03/17/2023    9:36 AM  CMP  Glucose 70 - 99 mg/dL 883  875  893   BUN 6 - 23 mg/dL 23  49  23   Creatinine 0.40 - 1.50 mg/dL 8.89  8.31  8.96   Sodium 135 - 145 mEq/L 140  136  139   Potassium 3.5 - 5.1 mEq/L 3.6  4.1  3.9   Chloride 96 - 112 mEq/L 104  102  107   CO2 19 - 32 mEq/L 25  22  22    Calcium  8.4 - 10.5 mg/dL 8.5  9.6  9.0   Total Protein 6.5 - 8.1 g/dL  8.0  6.9    Total Bilirubin <1.2 mg/dL  1.0  0.7   Alkaline Phos 38 - 126 U/L  70  67   AST 15 - 41 U/L  17  25   ALT 0 - 44 U/L  23  30     Lab Results  Component Value Date   CHOL 100 06/18/2023   HDL 27.80 (L) 06/18/2023   LDLCALC 46 06/18/2023   TRIG 132.0 06/18/2023   CHOLHDL 4 06/18/2023   No results for input(s): LIPOA in the  last 8760 hours. No components found for: NTPROBNP No results for input(s): PROBNP in the last 8760 hours. No results for input(s): TSH in the last 8760 hours.  Physical Exam:    Today's Vitals   07/25/24 1552  BP: (!) 148/89  Pulse: 73  Resp: 16  SpO2: 98%  Weight: (!) 311 lb (141.1 kg)  Height: 5' 9 (1.753 m)   Body mass index is 45.93 kg/m. Wt Readings from Last 3 Encounters:  07/25/24 (!) 311 lb (141.1 kg)  05/03/24 (!) 309 lb (140.2 kg)  03/08/24 (!) 309 lb 9.6 oz (140.4 kg)    Physical Exam  Constitutional: No distress.  hemodynamically stable  Neck: No JVD present.  Cardiovascular: Normal rate, regular rhythm, S1 normal and S2 normal. Exam reveals no gallop, no S3 and no S4.  No murmur heard. Pulmonary/Chest: Effort normal and breath sounds normal. No stridor. He has no wheezes. He has no rales.  Abdominal:  Abdominal obesity  Musculoskeletal:        General: No edema.     Cervical back: Neck supple.  Skin: Skin is warm.     Impression & Recommendation(s):  Impression:   ICD-10-CM   1. Precordial pain  R07.2 EKG 12-Lead    isosorbide  mononitrate (IMDUR ) 30 MG 24 hr tablet    ECHOCARDIOGRAM COMPLETE    NM PET CT CARDIAC PERFUSION MULTI W/ABSOLUTE BLOODFLOW    2. Coronary artery disease involving native coronary artery of native heart with other form of angina pectoris (HCC)  I25.118 EKG 12-Lead    isosorbide  mononitrate (IMDUR ) 30 MG 24 hr tablet    ECHOCARDIOGRAM COMPLETE    NM PET CT CARDIAC PERFUSION MULTI W/ABSOLUTE BLOODFLOW    3. Shortness of breath  R06.02 EKG 12-Lead    isosorbide  mononitrate (IMDUR ) 30 MG 24  hr tablet    ECHOCARDIOGRAM COMPLETE    NM PET CT CARDIAC PERFUSION MULTI W/ABSOLUTE BLOODFLOW    4. History of placement of stent in LAD coronary artery  Z95.5 EKG 12-Lead    isosorbide  mononitrate (IMDUR ) 30 MG 24 hr tablet    ECHOCARDIOGRAM COMPLETE    NM PET CT CARDIAC PERFUSION MULTI W/ABSOLUTE BLOODFLOW    5. Primary hypertension  I10     6. Mixed hyperlipidemia  E78.2     7. Class 3 severe obesity due to excess calories with serious comorbidity and body mass index (BMI) of 45.0 to 49.9 in adult  E66.813    Z68.42        Recommendation(s):  Precordial pain Coronary artery disease involving native coronary artery of native heart with other form of angina pectoris (HCC) Shortness of breath History of placement of stent in LAD coronary artery Continues to have precordial discomfort suggestive of cardiac discomfort. No significant improvement since last office visit with Jackee Alberts, nurse petitioner EKG is not ischemic. Antianginal therapy: Cardizem , Toprol -XL, sublingual nitroglycerin  tablets Start Imdur  30 mg p.o. every evening. Patient is not on PDE 5 inhibitors.  Drug-drug interactions discussed.  Patient is aware to discontinue long acting nitrates if erectile dysfunction medications are initiated. Echo will be ordered to evaluate for structural heart disease and left ventricular systolic function. Plan cardiac PET/CT to evaluate for myocardial flow reserve and reversible ischemia.  He will also help decrease soft tissue attenuation given his BMI of 45.  Primary hypertension Office blood pressures are not at goal. Does not check home blood pressures. Will add Imdur  as discussed above Reemphasized importance of low-salt diet.  Mixed hyperlipidemia  Currently on Lipitor 40 mg p.o. daily, Zetia  10 mg p.o. daily.   He denies myalgia or other side effects. Most recent lipids dated June 2024 [facility] database, independently.  LDL is 46 mg/dL.  Cardiology is following  peripherally.   Class 3 severe obesity due to excess calories with serious comorbidity and body mass index (BMI) of 45.0 to 49.9 in adult Body mass index is 45.93 kg/m. I reviewed with him importance of diet, regular physical activity/exercise, weight loss.   Until ischemic workup is not complete patient is advised not to overexert. Discussed GLP-1 agonist.  Patient states that he is tried them in the past but cannot tolerate the GI side effects Patient is educated on the importance of increasing physical activity gradually as tolerated with a goal of moderate intensity exercise for 30 minutes a day 5 days a week.  Orders Placed:  Orders Placed This Encounter  Procedures   NM PET CT CARDIAC PERFUSION MULTI W/ABSOLUTE BLOODFLOW    Standing Status:   Future    Expected Date:   07/26/2024    Expiration Date:   07/25/2025    If indicated for the ordered procedure, I authorize the administration of a radiopharmaceutical per Radiology protocol:   Yes   EKG 12-Lead   ECHOCARDIOGRAM COMPLETE    Standing Status:   Future    Expiration Date:   07/25/2025    Where [facility] this test be performed[facility]    Does the patient weigh less than or greater than 250 lbs?:   Patient weighs less than 250 lbs    Perflutren  DEFINITY  (image enhancing agent) should be administered unless hypersensitivity or allergy exist:   Administer Perflutren     Reason for exam-Echo:   Other-Full Diagnosis List    Full ICD-10/Reason for Exam:   Precordial pain [786.51.ICD-9-CM]     Final Medication List:    Meds ordered this encounter  Medications   isosorbide  mononitrate (IMDUR ) 30 MG 24 hr tablet    Sig: Take 1 tablet (30 mg total) by mouth daily.    Dispense:  30 tablet    Refill:  3    There are no discontinued medications.   Current Outpatient Medications:    aspirin  EC 81 MG tablet, Take 81 mg by mouth daily. Swallow whole., Disp: , Rfl:    atorvastatin  (LIPITOR) 40 MG tablet, TAKE 1 TABLET BY  MOUTH EVERY DAY, Disp: 30 tablet, Rfl: 0   clopidogrel  (PLAVIX ) 75 MG tablet, Take 1 tablet (75 mg total) by mouth daily., Disp: 90 tablet, Rfl: 1   diltiazem  (CARDIZEM  CD) 120 MG 24 hr capsule, TAKE 1 CAPSULE BY MOUTH EVERY DAY, Disp: 90 capsule, Rfl: 1   ezetimibe  (ZETIA ) 10 MG tablet, TAKE 1 TABLET BY MOUTH EVERY DAY, Disp: 90 tablet, Rfl: 0   hydrocortisone  (ANUSOL -HC) 2.5 % rectal cream, Place 1 Application rectally at bedtime. Apply internally and externally to hemorrhoids for 10 days, Disp: 30 g, Rfl: 0   isosorbide  mononitrate (IMDUR ) 30 MG 24 hr tablet, Take 1 tablet (30 mg total) by mouth daily., Disp: 30 tablet, Rfl: 3   LORazepam  (ATIVAN ) 1 MG tablet, Take 1 tablet (1 mg total) by mouth every 8 (eight) hours as needed. for anxiety, Disp: 30 tablet, Rfl: 0   losartan -hydrochlorothiazide  (HYZAAR) 100-12.5 MG tablet, Take 1 tablet by mouth daily., Disp: , Rfl:    metoprolol  succinate (TOPROL -XL) 25 MG 24 hr tablet, TAKE 1 TABLET (25 MG TOTAL) BY MOUTH DAILY (  STOP ATENOLOL), Disp: 90 tablet, Rfl: 3   nitroGLYCERIN  (NITROSTAT ) 0.4 MG SL tablet, PLACE 1 TABLET UNDER THE TONGUE EVERY 5 MINUTES AS NEEDED FOR CHEST PAIN., Disp: 450 tablet, Rfl: 1  Consent:   NA  Disposition:   70-month follow-up sooner if needed  His questions and concerns were addressed to his satisfaction. He voices understanding of the recommendations provided during this encounter.    Signed, Madonna Michele HAS, [facility]  A Division of [facility] [address], [place], [place] [address]  [place], [place] [address] 07/25/2024 7:23 PM

## 2024-07-25 NOTE — Patient Instructions (Addendum)
 Medication Instructions:  START Isosorbide  Mononitrate (Imdur ) 30 mg once daily in the evening  *If you need a refill on your cardiac medications before your next appointment, please call your pharmacy*  Lab Work: None ordered today. If you have labs (blood work) drawn today and your tests are completely normal, you will receive your results only by: MyChart Message (if you have MyChart) OR A paper copy in the mail If you have any lab test that is abnormal or we need to change your treatment, we will call you to review the results.  Testing/Procedures: Your physician has requested that you have an echocardiogram. Echocardiography is a painless test that uses sound waves to create images of your heart. It provides your doctor with information about the size and shape of your heart and how well your heart's chambers and valves are working. This procedure takes approximately one hour. There are no restrictions for this procedure. Please do NOT wear cologne, perfume, aftershave, or lotions (deodorant is allowed). Please arrive 15 minutes prior to your appointment time.  Please note: We ask at that you not bring children with you during ultrasound (echo/ vascular) testing. Due to room size and safety concerns, children are not allowed in the ultrasound rooms during exams. Our front office staff cannot provide observation of children in our lobby area while testing is being conducted. An adult accompanying a patient to their appointment will only be allowed in the ultrasound room at the discretion of the ultrasound technician under special circumstances. We apologize for any inconvenience.   Follow-Up: At [facility], you and your health needs are our priority.  As part of our continuing mission to provide you with exceptional heart care, our providers are all part of one team.  This team includes your primary Cardiologist (physician) and Advanced Practice Providers or APPs (Physician  Assistants and Nurse Practitioners) who all work together to provide you with the care you need, when you need it.  Your next appointment:   6 month(s)  Provider:   Madonna Large, DO    We recommend signing up for the patient portal called MyChart.  Sign up information is provided on this After Visit Summary.  MyChart is used to connect with patients for Virtual Visits (Telemedicine).  Patients are able to view lab/test results, encounter notes, upcoming appointments, etc.  Non-urgent messages can be sent to your provider as well.   To learn more about what you can do with MyChart, go to [email or web address].   Other Instructions How to Prepare for Your Cardiac PET/CT Stress Test:  1. Please do not take these medications before your test:  ~Medications that may interfere with the cardiac pharmacological stress agent (ex. nitrates - including erectile dysfunction medications, isosorbide  mononitrate, tamulosin or beta-blockers) the day of the exam. (Erectile dysfunction medication should be held for at least 72 hrs prior to test) ~Theophylline containing medications for 12 hours. ~Dipyridamole 48 hours prior to the test. ~Your remaining medications may be taken with water.  HOLD Imdur  and Metoprolol  the day of the scan. Can restart the day after.  2. Nothing to eat or drink, except water, 3 hours prior to arrival time.   ~ NO caffeine/decaffeinated products, or chocolate 12 hours prior to arrival.  3. NO perfume, cologne or lotion on chest or abdomen area.         - FEMALES - Please avoid wearing dresses to this appointment.  4. Total time is 1 to 2 hours; you may want to  bring reading material for the waiting time.  Please report to Radiology at the [facility] Main Entrance 30 minutes early for your test. [address] [place], [place] [address]  OR  Please report to Radiology at [facility] Main Entrance, medical mall, 30 mins prior to your  test. [address] [place], [place] [phone]  Diabetic Preparation: - Hold oral medications. - You may take NPH and Lantus insulin . - Do not take Humalog or Humulin R  (Regular Insulin ) the day of your test. - Check blood sugars prior to leaving the house. - If able to eat breakfast prior to 3 hour fasting, you may take all medications, including your insulin , - Do not worry if you miss your breakfast dose of insulin  - start at your next meal. - Patients who wear a continuous glucose monitor MUST remove the device prior to scanning.  IF YOU THINK YOU MAY BE PREGNANT, OR ARE NURSING PLEASE INFORM THE TECHNOLOGIST.  In preparation for your appointment, medication and supplies will be purchased.  Appointment availability is limited, so if you need to cancel or reschedule, please call the Radiology Department at [phone] [facility]) OR [phone] [facility])  24 hours in advance to avoid a cancellation fee of $100.00  What to Expect After you Arrive:  Once you arrive and check in for your appointment, you will be taken to a preparation room within the Radiology Department.  A technologist or Nurse will obtain your medical history, verify that you are correctly prepped for the exam, and explain the procedure.  Afterwards,  an IV will be started in your arm and electrodes will be placed on your skin for EKG monitoring during the stress portion of the exam. Then you will be escorted to the PET/CT scanner.  There, staff will get you positioned on the scanner and obtain a blood pressure and EKG.  During the exam, you will continue to be connected to the EKG and blood pressure machines.  A small, safe amount of a radioactive tracer will be injected in your IV to obtain a series of pictures of your heart along with an injection of a stress agent.    After your Exam:  It is recommended that you eat a meal and drink a caffeinated beverage to counter act any effects of the stress agent.   Drink plenty of fluids for the remainder of the day and urinate frequently for the first couple of hours after the exam.  Your doctor will inform you of your test results within 7-10 business days.  For more information and frequently asked questions, please visit our website : [email or web address]  For questions about your test or how to prepare for your test, please call: Cardiac Imaging Nurse Navigators Office: [phone]

## 2024-07-27 ENCOUNTER — Other Ambulatory Visit (INDEPENDENT_AMBULATORY_CARE_PROVIDER_SITE_OTHER)

## 2024-07-27 DIAGNOSIS — Z955 Presence of coronary angioplasty implant and graft: Secondary | ICD-10-CM

## 2024-07-27 DIAGNOSIS — I25118 Atherosclerotic heart disease of native coronary artery with other forms of angina pectoris: Secondary | ICD-10-CM | POA: Diagnosis not present

## 2024-07-27 DIAGNOSIS — R0602 Shortness of breath: Secondary | ICD-10-CM

## 2024-07-27 DIAGNOSIS — R072 Precordial pain: Secondary | ICD-10-CM | POA: Diagnosis not present

## 2024-07-27 LAB — ECHOCARDIOGRAM COMPLETE
AR max vel: 2.72 cm2
AV Area VTI: 2.77 cm2
AV Area mean vel: 2.66 cm2
AV Mean grad: 4 mmHg
AV Peak grad: 7.3 mmHg
Ao pk vel: 1.35 m/s
Area-P 1/2: 3.05 cm2
S' Lateral: 3.27 cm

## 2024-07-27 MED ORDER — PERFLUTREN LIPID MICROSPHERE
1.0000 mL | INTRAVENOUS | Status: AC | PRN
Start: 2024-07-27 — End: 2024-07-27
  Administered 2024-07-27: 8 mL via INTRAVENOUS

## 2024-07-31 ENCOUNTER — Ambulatory Visit: Payer: Self-pay | Admitting: Cardiology

## 2024-08-14 ENCOUNTER — Other Ambulatory Visit: Payer: Self-pay | Admitting: Family Medicine

## 2024-08-16 ENCOUNTER — Other Ambulatory Visit: Payer: Self-pay | Admitting: Family Medicine

## 2024-08-22 ENCOUNTER — Other Ambulatory Visit (HOSPITAL_BASED_OUTPATIENT_CLINIC_OR_DEPARTMENT_OTHER)

## 2024-08-23 ENCOUNTER — Encounter (HOSPITAL_COMMUNITY): Payer: Self-pay

## 2024-08-24 ENCOUNTER — Ambulatory Visit
Admission: RE | Admit: 2024-08-24 | Discharge: 2024-08-24 | Disposition: A | Discharge status: Home / Self Care | Source: Ambulatory Visit | Attending: Cardiology | Admitting: Cardiology

## 2024-08-24 DIAGNOSIS — I25118 Atherosclerotic heart disease of native coronary artery with other forms of angina pectoris: Secondary | ICD-10-CM | POA: Insufficient documentation

## 2024-08-24 DIAGNOSIS — R072 Precordial pain: Secondary | ICD-10-CM | POA: Diagnosis not present

## 2024-08-24 DIAGNOSIS — Z955 Presence of coronary angioplasty implant and graft: Secondary | ICD-10-CM | POA: Insufficient documentation

## 2024-08-24 DIAGNOSIS — I898 Other specified noninfective disorders of lymphatic vessels and lymph nodes: Secondary | ICD-10-CM | POA: Diagnosis not present

## 2024-08-24 DIAGNOSIS — R0602 Shortness of breath: Secondary | ICD-10-CM | POA: Insufficient documentation

## 2024-08-24 MED ORDER — REGADENOSON 0.4 MG/5ML IV SOLN
INTRAVENOUS | Status: AC
  Filled 2024-08-24: qty 5

## 2024-08-24 MED ORDER — RUBIDIUM RB82 GENERATOR (RUBYFILL)
25.0000 | PACK | Freq: Once | INTRAVENOUS | Status: AC
  Administered 2024-08-24: 24.84 via INTRAVENOUS

## 2024-08-24 MED ORDER — REGADENOSON 0.4 MG/5ML IV SOLN
0.4000 mg | Freq: Once | INTRAVENOUS | Status: AC
  Administered 2024-08-24: 0.4 mg via INTRAVENOUS
  Filled 2024-08-24: qty 5

## 2024-08-24 MED ORDER — RUBIDIUM RB82 GENERATOR (RUBYFILL)
25.0000 | PACK | Freq: Once | INTRAVENOUS | Status: AC
  Administered 2024-08-24: 25 via INTRAVENOUS

## 2024-08-25 LAB — NM PET CT CARDIAC PERFUSION MULTI W/ABSOLUTE BLOODFLOW
LV dias vol: 107 mL (ref 62–150)
LV sys vol: 33 mL (ref 4.2–5.8)
MBFR: 2.94
Nuc Rest EF: 69 %
Nuc Stress EF: 66 %
Peak HR: 113 {beats}/min
Rest HR: 82 {beats}/min
Rest MBF: 0.93 ml/g/min
Rest Nuclear Isotope Dose: 25 mCi
SRS: 1
SSS: 1
ST Depression (mm): 0 mm
Stress MBF: 2.73 ml/g/min
Stress Nuclear Isotope Dose: 24.8 mCi
TID: 0.92

## 2024-08-29 ENCOUNTER — Encounter: Payer: Self-pay | Admitting: Family Medicine

## 2024-09-04 ENCOUNTER — Ambulatory Visit: Admitting: Family Medicine

## 2024-09-04 ENCOUNTER — Encounter: Payer: Self-pay | Admitting: Family Medicine

## 2024-09-04 VITALS — BP 160/90 | HR 74 | Temp 97.5°F | Wt 312.9 lb

## 2024-09-04 DIAGNOSIS — Z125 Encounter for screening for malignant neoplasm of prostate: Secondary | ICD-10-CM

## 2024-09-04 DIAGNOSIS — R590 Localized enlarged lymph nodes: Secondary | ICD-10-CM

## 2024-09-04 DIAGNOSIS — J984 Other disorders of lung: Secondary | ICD-10-CM

## 2024-09-04 DIAGNOSIS — E785 Hyperlipidemia, unspecified: Secondary | ICD-10-CM

## 2024-09-04 DIAGNOSIS — E559 Vitamin D deficiency, unspecified: Secondary | ICD-10-CM

## 2024-09-04 DIAGNOSIS — R5383 Other fatigue: Secondary | ICD-10-CM

## 2024-09-04 MED ORDER — ATORVASTATIN CALCIUM 40 MG PO TABS: 40.0000 mg | ORAL_TABLET | Freq: Every day | ORAL | 3 refills | Status: AC

## 2024-09-04 NOTE — Progress Notes (Signed)
 Established Patient Office Visit  Subjective   Patient ID: Hector [name], male    DOB: [birth year]  Age: 53 y.o. MRN: [id number]  Chief Complaint  Patient presents with   Medical Management of Chronic Issues    HPI   Hector [name] has history of CAD, hypertension, GERD, hyperlipidemia, metabolic syndrome, morbid obesity.  He had recent PET cardiac perfusion imaging.  This showed normal EF and was a low risk study but he did have incidental finding of calcified mediastinal and hilar lymph nodes compatible with remote granulomatous disease. In looking back he had coronary calcium  score 2019 which showed calcified right lower lobe granuloma.  Patient has had some progressive dyspnea over the past year specially.  No cough.  Increased fatigue.  No reported fever.  No hemoptysis.  He has hypertension and blood pressures up today.  We had recently added diltiazem  CD 120 mg and he has not been taking this consistently.  He states he simply got tired of taking so many medications.  He has hyperlipidemia and is currently treated with Zetia  and atorvastatin  40 mg daily.  Also takes baby aspirin  daily.  Needs follow-up lipid panel but not fasting today.  He does have some mild neuropathy symptoms in both feet but thinks some of this is due to his excessive weight and increased pressure on his feet.  He relates history of low vitamin D .  He had been taking 50,000 international unit dosage but recently taking over-the-counter.  Would like to get follow-up levels.  Past Medical History:  Diagnosis Date   Alcohol abuse    Allergy    Anal fissure    Anxiety    Asthma    Back pain    Bilateral swelling of feet    Blood in stool    Chest pain    Coronary artery disease    Depression    Drug use    Febrile seizure (HCC)    GERD (gastroesophageal reflux disease)    Heartburn    Hyperlipidemia    Hypertension    Irritable bowel syndrome with diarrhea    Jaundice, newborn    Palpitations    Panic     Panic disorder    Pre-diabetes    Seizures (HCC)    febrile   Shortness of breath    Sleep apnea    Venous reflux    Past Surgical History:  Procedure Laterality Date   CARDIAC CATHETERIZATION     COLONOSCOPY  2005   CORONARY STENT INTERVENTION N/A 08/19/2020   Procedure: CORONARY STENT INTERVENTION;  Surgeon: Dann Candyce RAMAN, MD;  Location: [facility] INVASIVE CV LAB;  Service: Cardiovascular;  Laterality: N/A;   CORONARY ULTRASOUND/IVUS N/A 08/19/2020   Procedure: Intravascular Ultrasound/IVUS;  Surgeon: Dann Candyce RAMAN, MD;  Location: [facility] INVASIVE CV LAB;  Service: Cardiovascular;  Laterality: N/A;   HEMORRHOID BANDING     LEFT HEART CATH AND CORONARY ANGIOGRAPHY N/A 08/19/2020   Procedure: LEFT HEART CATH AND CORONARY ANGIOGRAPHY;  Surgeon: Dann Candyce RAMAN, MD;  Location: [facility] INVASIVE CV LAB;  Service: Cardiovascular;  Laterality: N/A;   SIGMOIDOSCOPY     WISDOM TOOTH EXTRACTION  1991    reports that he has never smoked. He has never used smokeless tobacco. He reports current alcohol use. He reports current drug use. Drug: Marijuana. family history includes Basal cell carcinoma in his mother; Cancer in his mother; Colitis in his brother; Colon polyps in his brother and father; Dementia in his mother; Depression in his  father and mother; Diabetes in his father; Heart attack in his father; Heart disease (age of onset: 59) in his father; Hyperlipidemia in his father; Hypertension in his father and mother; Obesity in his father; Skin cancer in his mother; Sleep apnea in his father; Squamous cell carcinoma in his mother; Stroke in his father and mother. Allergies  Allergen Reactions   Other Diarrhea    GLP-1RA    Review of Systems  Constitutional:  Positive for malaise/fatigue. Negative for chills, fever and weight loss.  Respiratory:  Positive for shortness of breath. Negative for cough, hemoptysis and wheezing.   Cardiovascular:  Negative for chest pain.  Gastrointestinal:   Negative for abdominal pain.  Genitourinary:  Negative for dysuria.      Objective:     BP (!) 160/90   Pulse 74   Temp (!) 97.5 F (36.4 C) (Oral)   Wt (!) 312 lb 14.4 oz (141.9 kg)   SpO2 98%   BMI 46.21 kg/m  BP Readings from Last 3 Encounters:  09/04/24 (!) 160/90  08/24/24 130/67  07/25/24 (!) 148/89   Wt Readings from Last 3 Encounters:  09/04/24 (!) 312 lb 14.4 oz (141.9 kg)  07/25/24 (!) 311 lb (141.1 kg)  05/03/24 (!) 309 lb (140.2 kg)      Physical Exam Vitals reviewed.  Constitutional:      General: He is not in acute distress.    Appearance: He is not ill-appearing.  Cardiovascular:     Rate and Rhythm: Normal rate and regular rhythm.  Pulmonary:     Effort: Pulmonary effort is normal.     Breath sounds: Normal breath sounds. No wheezing or rales.  Musculoskeletal:     Right lower leg: No edema.     Left lower leg: No edema.  Neurological:     Mental Status: He is alert.      No results found for any visits on 09/04/24.    The ASCVD Risk score (Arnett DK, et al., 2019) failed to calculate for the following reasons:   The valid total cholesterol range is 130 to 320 mg/dL    Assessment & Plan:   Problem List Items Addressed This Visit   None Visit Diagnoses       Vitamin D  deficiency    -  Primary   Relevant Orders   VITAMIN D  25 Hydroxy (Vit-D Deficiency, Fractures)     Hilar adenopathy         Calcified granuloma of lung       Relevant Orders   CBC with Differential/Platelet   CMP   Sedimentation Rate   QuantiFERON-TB Gold Plus   ANA   ANCA screen with reflex titer   Protein Electrophoresis, Serum     Fatigue, unspecified type       Relevant Orders   CBC with Differential/Platelet   CMP     Hyperlipidemia, unspecified hyperlipidemia type       Relevant Medications   atorvastatin  (LIPITOR) 40 MG tablet   Other Relevant Orders   Lipid panel     Prostate cancer screening       Relevant Orders   PSA     Patient seen with  recently noted calcified mediastinal and hilar nodes compatible with remote granulomatous disease.  He has had some nonspecific fatigue and dyspnea with exertion but no chronic cough and no reported fever.  -Check labs as above - Set up labs for this coming Friday as above - If above unrevealing consider pulmonary  referral for further evaluation  -We discussed his weight at some length.  His blood pressure is currently poorly controlled and we recommended getting back on diltiazem  which he has not been taking recently.  We strongly advised lifestyle changes to address his weight and his previous motivation has been low  No follow-ups on file.    Wolm Scarlet, MD

## 2024-09-08 ENCOUNTER — Other Ambulatory Visit (INDEPENDENT_AMBULATORY_CARE_PROVIDER_SITE_OTHER)

## 2024-09-08 ENCOUNTER — Ambulatory Visit: Payer: Self-pay | Admitting: Family Medicine

## 2024-09-08 DIAGNOSIS — E785 Hyperlipidemia, unspecified: Secondary | ICD-10-CM | POA: Diagnosis not present

## 2024-09-08 DIAGNOSIS — J984 Other disorders of lung: Secondary | ICD-10-CM | POA: Diagnosis not present

## 2024-09-08 DIAGNOSIS — R5383 Other fatigue: Secondary | ICD-10-CM

## 2024-09-08 DIAGNOSIS — E559 Vitamin D deficiency, unspecified: Secondary | ICD-10-CM

## 2024-09-08 DIAGNOSIS — Z125 Encounter for screening for malignant neoplasm of prostate: Secondary | ICD-10-CM

## 2024-09-08 LAB — CBC WITH DIFFERENTIAL/PLATELET
Basophils Absolute: 0 K/uL (ref 0.0–0.1)
Basophils Relative: 0.5 % (ref 0.0–3.0)
Eosinophils Absolute: 0.2 K/uL (ref 0.0–0.7)
Eosinophils Relative: 2.9 % (ref 0.0–5.0)
HCT: 44.2 % (ref 39.0–52.0)
Hemoglobin: 15.3 g/dL (ref 13.0–17.0)
Lymphocytes Relative: 21.5 % (ref 12.0–46.0)
Lymphs Abs: 1.2 K/uL (ref 0.7–4.0)
MCHC: 34.6 g/dL (ref 30.0–36.0)
MCV: 86.9 fl (ref 78.0–100.0)
Monocytes Absolute: 0.4 K/uL (ref 0.1–1.0)
Monocytes Relative: 7.8 % (ref 3.0–12.0)
Neutro Abs: 3.7 K/uL (ref 1.4–7.7)
Neutrophils Relative %: 67.3 % (ref 43.0–77.0)
Platelets: 174 K/uL (ref 150.0–400.0)
RBC: 5.08 Mil/uL (ref 4.22–5.81)
RDW: 12.7 % (ref 11.5–15.5)
WBC: 5.5 K/uL (ref 4.0–10.5)

## 2024-09-08 LAB — COMPREHENSIVE METABOLIC PANEL WITH GFR
ALT: 28 U/L (ref 0–53)
AST: 20 U/L (ref 0–37)
Albumin: 4.2 g/dL (ref 3.5–5.2)
Alkaline Phosphatase: 69 U/L (ref 39–117)
BUN: 17 mg/dL (ref 6–23)
CO2: 28 meq/L (ref 19–32)
Calcium: 8.9 mg/dL (ref 8.4–10.5)
Chloride: 104 meq/L (ref 96–112)
Creatinine, Ser: 1.16 mg/dL (ref 0.40–1.50)
GFR: 72.22 mL/min (ref >60.00)
Glucose, Bld: 113 mg/dL — ABNORMAL HIGH (ref 70–99)
Potassium: 3.8 meq/L (ref 3.5–5.1)
Sodium: 142 meq/L (ref 135–145)
Total Bilirubin: 0.7 mg/dL (ref 0.2–1.2)
Total Protein: 6.6 g/dL (ref 6.0–8.3)

## 2024-09-08 LAB — LIPID PANEL
Cholesterol: 99 mg/dL (ref 0–200)
HDL: 30.8 mg/dL — ABNORMAL LOW (ref >39.00)
LDL Cholesterol: 48 mg/dL (ref 0–99)
NonHDL: 68.66
Total CHOL/HDL Ratio: 3
Triglycerides: 101 mg/dL (ref 0.0–149.0)
VLDL: 20.2 mg/dL (ref 0.0–40.0)

## 2024-09-08 LAB — VITAMIN D 25 HYDROXY (VIT D DEFICIENCY, FRACTURES): VITD: 29.02 ng/mL — ABNORMAL LOW (ref 30.00–100.00)

## 2024-09-08 LAB — PSA: PSA: 0.78 ng/mL (ref 0.10–4.00)

## 2024-09-08 LAB — SEDIMENTATION RATE: Sed Rate: 8 mm/h (ref 0–20)

## 2024-09-12 ENCOUNTER — Other Ambulatory Visit: Payer: Self-pay | Admitting: Family Medicine

## 2024-09-14 LAB — PROTEIN ELECTROPHORESIS, SERUM
Albumin ELP: 3.8 g/dL (ref 3.8–4.8)
Alpha 1: 0.3 g/dL (ref 0.2–0.3)
Alpha 2: 0.7 g/dL (ref 0.5–0.9)
Beta 2: 0.5 g/dL (ref 0.2–0.5)
Beta Globulin: 0.4 g/dL (ref 0.4–0.6)
Gamma Globulin: 0.9 g/dL (ref 0.8–1.7)
Total Protein: 6.6 g/dL (ref 6.1–8.1)

## 2024-09-14 LAB — QUANTIFERON-TB GOLD PLUS
Mitogen-NIL: 8.45 [IU]/mL
NIL: 0.02 [IU]/mL
QuantiFERON-TB Gold Plus: NEGATIVE
TB1-NIL: 0 [IU]/mL
TB2-NIL: 0 [IU]/mL

## 2024-09-14 LAB — ANA: Anti Nuclear Antibody (ANA): NEGATIVE

## 2024-09-14 LAB — ANCA SCREEN W REFLEX TITER: ANCA SCREEN: NEGATIVE

## 2024-09-18 ENCOUNTER — Encounter: Payer: Self-pay | Admitting: Family Medicine

## 2024-09-19 ENCOUNTER — Other Ambulatory Visit: Payer: Self-pay | Admitting: Family Medicine

## 2024-09-19 NOTE — Telephone Encounter (Unsigned)
 Copied from CRM #[id number]. Topic: Clinical - Medication Refill >> Sep 19, 2024  3:53 PM Mesmerise C wrote: Medication: metoprolol  succinate (TOPROL -XL) 25 MG 24 hr tablet  Has the patient contacted their pharmacy? Yes (Agent: If no, request that the patient contact the pharmacy for the refill. If patient does not wish to contact the pharmacy document the reason why and proceed with request.) (Agent: If yes, when and what did the pharmacy advise?)  This is the patient's preferred pharmacy:  [facility] [id number] [place], [place] - [address] [place] [address] Phone: [phone] Fax: [phone] Hours: Not open 24 hours      Is this the correct pharmacy for this prescription? Yes If no, delete pharmacy and type the correct one.   Has the prescription been filled recently? No  Is the patient out of the medication? Yes  Has the patient been seen for an appointment in the last year OR does the patient have an upcoming appointment? Yes  Can we respond through MyChart? Yes  Agent: Please be advised that Rx refills may take up to 3 business days. We ask that you follow-up with your pharmacy.

## 2024-09-20 ENCOUNTER — Encounter: Payer: Self-pay | Admitting: Family Medicine

## 2024-09-20 DIAGNOSIS — J984 Other disorders of lung: Secondary | ICD-10-CM

## 2024-09-20 MED ORDER — METOPROLOL SUCCINATE ER 25 MG PO TB24: ORAL_TABLET | ORAL | 3 refills | Status: DC

## 2024-09-20 MED ORDER — METOPROLOL SUCCINATE ER 25 MG PO TB24: ORAL_TABLET | ORAL | 0 refills | Status: DC

## 2024-09-20 NOTE — Telephone Encounter (Signed)
 Go ahead with pulmonary referral for further evaluation of multiple pulmonary granulomas.  Autoimmune screens and TB screen negative.  I refilled the Metoprolol  for one year.    Wolm LELON Scarlet MD [facility] at [facility]

## 2024-09-20 NOTE — Telephone Encounter (Signed)
 Yes-- just give him 90 day supply only without refills-- that way they won't send me future refill requests

## 2024-09-21 NOTE — Telephone Encounter (Signed)
 Noted

## 2024-10-18 ENCOUNTER — Encounter: Payer: Self-pay | Admitting: Family Medicine

## 2024-10-19 ENCOUNTER — Ambulatory Visit: Admitting: *Deleted

## 2024-10-19 ENCOUNTER — Encounter: Payer: Self-pay | Admitting: Pulmonary Disease

## 2024-10-19 ENCOUNTER — Ambulatory Visit: Admitting: Pulmonary Disease

## 2024-10-19 VITALS — BP 150/93 | HR 76 | Ht 69.0 in | Wt 214.6 lb

## 2024-10-19 DIAGNOSIS — J8410 Pulmonary fibrosis, unspecified: Secondary | ICD-10-CM

## 2024-10-19 DIAGNOSIS — I898 Other specified noninfective disorders of lymphatic vessels and lymph nodes: Secondary | ICD-10-CM | POA: Diagnosis not present

## 2024-10-19 LAB — PULMONARY FUNCTION TEST
DL/VA % pred: 121 %
DL/VA: 5.32 ml/min/mmHg/L
DLCO cor % pred: 116 %
DLCO cor: 33.58 ml/min/mmHg
DLCO unc % pred: 124 %
DLCO unc: 36.03 ml/min/mmHg
FEF 25-75 Post: 3.55 L/s
FEF 25-75 Pre: 3.95 L/s
FEF2575-%Change-Post: -10 %
FEF2575-%Pred-Post: 106 %
FEF2575-%Pred-Pre: 118 %
FEV1-%Change-Post: -1 %
FEV1-%Pred-Post: 91 %
FEV1-%Pred-Pre: 93 %
FEV1-Post: 3.52 L
FEV1-Pre: 3.58 L
FEV1FVC-%Change-Post: 1 %
FEV1FVC-%Pred-Pre: 106 %
FEV6-%Change-Post: -3 %
FEV6-%Pred-Post: 87 %
FEV6-%Pred-Pre: 90 %
FEV6-Post: 4.19 L
FEV6-Pre: 4.35 L
FEV6FVC-%Change-Post: 0 %
FEV6FVC-%Pred-Post: 103 %
FEV6FVC-%Pred-Pre: 103 %
FVC-%Change-Post: -3 %
FVC-%Pred-Post: 84 %
FVC-%Pred-Pre: 87 %
FVC-Post: 4.22 L
FVC-Pre: 4.35 L
Post FEV1/FVC ratio: 83 %
Post FEV6/FVC ratio: 99 %
Pre FEV1/FVC ratio: 82 %
Pre FEV6/FVC Ratio: 100 %
RV % pred: 76 %
RV: 1.61 L
TLC % pred: 88 %
TLC: 6.16 L

## 2024-10-19 NOTE — Patient Instructions (Signed)
 Full PFT performed today.

## 2024-10-19 NOTE — Progress Notes (Unsigned)
 Subjective:   PATIENT ID: Hector [name] GENDER: male DOB: 1971/03/31, MRN: [id number]   HPI Discussed the use of AI scribe software for clinical note transcription with the patient, who gave verbal consent to proceed.  History of Present Illness   Hector [name] is a 53 year old male with coronary artery disease and sleep apnea who presents with granulomas in the chest. He was referred by Dr. Micheal for evaluation of granulomas noted on a recent heart scan.  Granulomas are present in the hilar and mediastinal lymph nodes, identified on a recent heart scan. A previous scan in 2019 showed calcification at the tip of a lung lobe. Blood tests, including for tuberculosis, are negative. He is concerned about the implications of these findings.  He experiences shortness of breath during activities such as bending over and standing up, and occasionally upon waking or during the night. Some episodes are associated with sleep apnea. Chest pain is rated at 2-3 out of 10, located in the middle of the chest and sometimes around the back, with a sensation of congestion or tightness. A stent was placed in the LAD artery four years ago due to a 95% blockage.  He has sleep apnea and is non-compliant with CPAP therapy due to discomfort and panic disorder. An oral appliance caused jaw pain, leading to non-compliance. His father had a fatal event related to sleep apnea.  He has lived in [place] , [place] , and currently in [place]. He quit cannabis use six months ago due to shortness of breath and has a past history of nitrous oxide abuse. He describes potential environmental exposures, including mold in a previous residence and significant dust exposure from an air vent in his current home.      Past Medical History:  Diagnosis Date   Alcohol abuse    Allergy    Anal fissure    Anxiety    Asthma    Back pain    Bilateral swelling of feet    Blood in stool    Chest pain    Coronary artery disease     Depression    Drug use    Febrile seizure (HCC)    GERD (gastroesophageal reflux disease)    Heartburn    Hyperlipidemia    Hypertension    Irritable bowel syndrome with diarrhea    Jaundice, newborn    Palpitations    Panic    Panic disorder    Pre-diabetes    Seizures (HCC)    febrile   Shortness of breath    Sleep apnea    Venous reflux      Family History  Problem Relation Age of Onset   Cancer Mother    Dementia Mother    Stroke Mother    Hypertension Mother    Skin cancer Mother    Basal cell carcinoma Mother    Squamous cell carcinoma Mother    Depression Mother    Diabetes Father    Hyperlipidemia Father    Stroke Father    Heart disease Father 30   Colon polyps Father    Heart attack Father    Hypertension Father    Depression Father    Sleep apnea Father    Obesity Father    Colitis Brother    Colon polyps Brother      Social History   Socioeconomic History   Marital status: Divorced    Spouse name: Not on file   Number of children: 1  Years of education: Not on file   Highest education level: Bachelor's degree (e.g., BA, AB, BS)  Occupational History   Occupation: Cytogeneticist  Tobacco Use   Smoking status: Never   Smokeless tobacco: Never  Vaping Use   Vaping status: Former  Substance and Sexual Activity   Alcohol use: Yes    Alcohol/week: 0.0 standard drinks of alcohol    Comment: 1-5 per week   Drug use: Yes    Types: Marijuana    Comment: Reports sometimes he smokes daily sometimes weekly   Sexual activity: Yes  Other Topics Concern   Not on file  Social History Narrative   Patient is right-handed. He lives in a 2 story home. He drinks 2 20 oz sodas a day. He does not exercise.   Social Drivers of Corporate investment banker Strain: Low Risk  (09/01/2024)   Overall Financial Resource Strain (CARDIA)    Difficulty of Paying Living Expenses: Not very hard  Food Insecurity: No Food Insecurity (09/01/2024)   Hunger Vital Sign     Worried About Running Out of Food in the Last Year: Never true    Ran Out of Food in the Last Year: Never true  Transportation Needs: No Transportation Needs (09/01/2024)   PRAPARE - Administrator, Civil Service (Medical): No    Lack of Transportation (Non-Medical): No  Physical Activity: Inactive (09/01/2024)   Exercise Vital Sign    Days of Exercise per Week: 0 days    Minutes of Exercise per Session: Not on file  Stress: Stress Concern Present (09/01/2024)   [employer] of Occupational Health - Occupational Stress Questionnaire    Feeling of Stress: Rather much  Social Connections: Socially Isolated (09/01/2024)   Social Connection and Isolation Panel    Frequency of Communication with Friends and Family: Once a week    Frequency of Social Gatherings with Friends and Family: Never    Attends Religious Services: Never    Database administrator or Organizations: No    Attends Engineer, structural: Not on file    Marital Status: Divorced  Catering manager Violence: Not on file     Allergies  Allergen Reactions   Other Diarrhea    GLP-1RA     Outpatient Medications Prior to Visit  Medication Sig Dispense Refill   aspirin  EC 81 MG tablet Take 81 mg by mouth daily. Swallow whole.     atorvastatin  (LIPITOR) 40 MG tablet Take 1 tablet (40 mg total) by mouth daily. 90 tablet 3   clopidogrel  (PLAVIX ) 75 MG tablet Take 1 tablet (75 mg total) by mouth daily. 90 tablet 1   diltiazem  (CARDIZEM  CD) 120 MG 24 hr capsule TAKE 1 CAPSULE BY MOUTH EVERY DAY 90 capsule 1   ezetimibe  (ZETIA ) 10 MG tablet TAKE 1 TABLET BY MOUTH EVERY DAY 90 tablet 2   isosorbide  mononitrate (IMDUR ) 30 MG 24 hr tablet Take 1 tablet (30 mg total) by mouth daily. 30 tablet 3   LORazepam  (ATIVAN ) 1 MG tablet TAKE 1 TABLET (1 MG TOTAL) BY MOUTH EVERY 8 (EIGHT) HOURS AS NEEDED. FOR ANXIETY 30 tablet 0   losartan -hydrochlorothiazide  (HYZAAR) 100-12.5 MG tablet TAKE 1 TABLET BY MOUTH EVERY DAY 90  tablet 2   metoprolol  succinate (TOPROL -XL) 25 MG 24 hr tablet TAKE 1 TABLET (25 MG TOTAL) BY MOUTH DAILY (STOP ATENOLOL) 90 tablet 3   nitroGLYCERIN  (NITROSTAT ) 0.4 MG SL tablet PLACE 1 TABLET UNDER THE TONGUE EVERY  5 MINUTES AS NEEDED FOR CHEST PAIN. 450 tablet 1   venlafaxine XR (EFFEXOR-XR) 75 MG 24 hr capsule Take 75 mg by mouth every morning.     No facility-administered medications prior to visit.   Review of Systems  Constitutional:  Negative for chills, fever, malaise/fatigue and weight loss.  HENT:  Negative for congestion, sinus pain and sore throat.   Eyes: Negative.   Respiratory:  Positive for shortness of breath. Negative for cough, hemoptysis, sputum production and wheezing.   Cardiovascular:  Negative for chest pain, palpitations, orthopnea, claudication and leg swelling.  Gastrointestinal:  Negative for abdominal pain, heartburn, nausea and vomiting.  Genitourinary: Negative.   Musculoskeletal:  Negative for joint pain and myalgias.  Skin:  Negative for rash.  Neurological:  Negative for weakness.  Endo/Heme/Allergies: Negative.   Psychiatric/Behavioral: Negative.      Objective:   Vitals:   10/19/24 1354  BP: (!) 150/93  Pulse: 76  SpO2: 98%  Weight: 214 lb 9.6 oz (97.3 kg)  Height: 5' 9 (1.753 m)    Physical Exam Constitutional:      General: He is not in acute distress.    Appearance: Normal appearance. He is obese.  Eyes:     General: No scleral icterus.    Conjunctiva/sclera: Conjunctivae normal.  Cardiovascular:     Rate and Rhythm: Normal rate and regular rhythm.  Pulmonary:     Breath sounds: No wheezing, rhonchi or rales.  Musculoskeletal:     Right lower leg: No edema.     Left lower leg: No edema.  Skin:    General: Skin is warm and dry.  Neurological:     General: No focal deficit present.    CBC    Component Value Date/Time   WBC 5.5 09/08/2024 0851   RBC 5.08 09/08/2024 0851   HGB 15.3 09/08/2024 0851   HGB 14.6 09/03/2021  1022   HCT 44.2 09/08/2024 0851   HCT 42.8 09/03/2021 1022   PLT 174.0 09/08/2024 0851   PLT 158 09/03/2021 1022   MCV 86.9 09/08/2024 0851   MCV 90 09/03/2021 1022   MCH 30.1 12/02/2023 1253   MCHC 34.6 09/08/2024 0851   RDW 12.7 09/08/2024 0851   RDW 12.8 09/03/2021 1022   LYMPHSABS 1.2 09/08/2024 0851   LYMPHSABS 1.7 09/03/2021 1022   MONOABS 0.4 09/08/2024 0851   EOSABS 0.2 09/08/2024 0851   EOSABS 0.9 (H) 09/03/2021 1022   BASOSABS 0.0 09/08/2024 0851   BASOSABS 0.1 09/03/2021 1022      Latest Ref Rng & Units 09/08/2024    8:51 AM 12/06/2023    9:08 AM 12/02/2023   12:53 PM  BMP  Glucose 70 - 99 mg/dL 886  883  875   BUN 6 - 23 mg/dL 17  23  49   Creatinine 0.40 - 1.50 mg/dL 8.83  8.89  8.31   Sodium 135 - 145 mEq/L 142  140  136   Potassium 3.5 - 5.1 mEq/L 3.8  3.6  4.1   Chloride 96 - 112 mEq/L 104  104  102   CO2 19 - 32 mEq/L 28  25  22    Calcium  8.4 - 10.5 mg/dL 8.9  8.5  9.6    Chest imaging: NM PET CT Cardiac Scan 08/24/24 Mediastinum/Nodes: Calcified mediastinal and hilar lymph nodes compatible with remote granulomatous disease.   Lungs/Pleura: No pleural fluid, interstitial edema or airspace consolidation. No pneumothorax. Central airways appear patent. No suspicious lung nodule or mass identified.  PFT:    Latest Ref Rng & Units 10/19/2024    3:54 PM  PFT Results  FVC-Pre L 4.35  P  FVC-Predicted Pre % 87  P  FVC-Post L 4.22  P  FVC-Predicted Post % 84  P  Pre FEV1/FVC % % 82  P  Post FEV1/FCV % % 83  P  FEV1-Pre L 3.58  P  FEV1-Predicted Pre % 93  P  FEV1-Post L 3.52  P  DLCO uncorrected ml/min/mmHg 36.03  P  DLCO UNC% % 124  P  DLCO corrected ml/min/mmHg 33.58  P  DLCO COR %Predicted % 116  P  DLVA Predicted % 121  P  TLC L 6.16  P  TLC % Predicted % 88  P  RV % Predicted % 76  P    P Preliminary result    Labs:  Path:  Echo:  Heart Catheterization:    Assessment & Plan:   Granulomatous lung disease (HCC) - Plan: CT Chest Wo  Contrast, Pulmonary Function Test  Calcified lymph nodes - Plan: CT Chest Wo Contrast Assessment and Plan    Shortness of breath Multifactorial, related to obesity, possible deconditioning, and past cannabis use. Potential obstructive lung disease due to past smoking. - Schedule pulmonary function tests. - Consider inhaler therapy if tests indicate obstructive lung disease.  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 No current signs of active inflammation or infection. Differential includes past fungal exposure or mold inhalation. - Order CT chest scan. - Schedule follow-up CT chest scan in one year.  Obstructive sleep apnea Non-compliance to CPAP due to discomfort and panic disorder. Risks include shortness of breath, arrhythmias, and potential heart failure. Previous oral appliances unsuccessful. Discussed surgical option (Inspire device) and referral to sleep specialists. - Consider referral to sleep specialist for Inspire device evaluation. - Discuss weight loss strategies.  Obesity Contributing to shortness of breath and potential sleep apnea exacerbation. Difficulty with weight management and non-compliance with previous programs. Acknowledged need for lifestyle changes. - Discuss weight loss strategies. - Consider referral to weight management programs.     Follow up in 1 year  Dorn Chill, MD [facility] Office: [phone]   Current Outpatient Medications:    aspirin  EC 81 MG tablet, Take 81 mg by mouth daily. Swallow whole., Disp: , Rfl:    atorvastatin  (LIPITOR) 40 MG tablet, Take 1 tablet (40 mg total) by mouth daily., Disp: 90 tablet, Rfl: 3   clopidogrel  (PLAVIX ) 75 MG tablet, Take 1 tablet (75 mg total) by mouth daily., Disp: 90 tablet, Rfl: 1   diltiazem  (CARDIZEM  CD) 120 MG 24 hr capsule, TAKE 1 CAPSULE BY MOUTH EVERY DAY, Disp: 90 capsule, Rfl: 1    ezetimibe  (ZETIA ) 10 MG tablet, TAKE 1 TABLET BY MOUTH EVERY DAY, Disp: 90 tablet, Rfl: 2   isosorbide  mononitrate (IMDUR ) 30 MG 24 hr tablet, Take 1 tablet (30 mg total) by mouth daily., Disp: 30 tablet, Rfl: 3   LORazepam  (ATIVAN ) 1 MG tablet, TAKE 1 TABLET (1 MG TOTAL) BY MOUTH EVERY 8 (EIGHT) HOURS AS NEEDED. FOR ANXIETY, Disp: 30 tablet, Rfl: 0   losartan -hydrochlorothiazide  (HYZAAR) 100-12.5 MG tablet, TAKE 1 TABLET BY MOUTH EVERY DAY, Disp: 90 tablet, Rfl: 2   metoprolol  succinate (TOPROL -XL) 25 MG 24 hr tablet, TAKE 1 TABLET (25 MG TOTAL) BY MOUTH DAILY (STOP ATENOLOL), Disp: 90 tablet, Rfl: 3   nitroGLYCERIN  (NITROSTAT ) 0.4 MG SL tablet,  PLACE 1 TABLET UNDER THE TONGUE EVERY 5 MINUTES AS NEEDED FOR CHEST PAIN., Disp: 450 tablet, Rfl: 1   venlafaxine XR (EFFEXOR-XR) 75 MG 24 hr capsule, Take 75 mg by mouth every morning., Disp: , Rfl:

## 2024-10-19 NOTE — Patient Instructions (Addendum)
 I believe your granulomatous disease is old and not active  We will get a CT Chest scan to evaluate further  Schedule pulmonary function tests at the front desk   Follow up in 1 year

## 2024-10-19 NOTE — Progress Notes (Signed)
 Full PFT performed today.

## 2024-10-20 ENCOUNTER — Other Ambulatory Visit: Payer: Self-pay | Admitting: Cardiology

## 2024-10-20 ENCOUNTER — Encounter: Payer: Self-pay | Admitting: Pulmonary Disease

## 2024-10-20 DIAGNOSIS — R072 Precordial pain: Secondary | ICD-10-CM

## 2024-10-20 DIAGNOSIS — I25118 Atherosclerotic heart disease of native coronary artery with other forms of angina pectoris: Secondary | ICD-10-CM

## 2024-10-20 DIAGNOSIS — R0602 Shortness of breath: Secondary | ICD-10-CM

## 2024-10-20 DIAGNOSIS — Z955 Presence of coronary angioplasty implant and graft: Secondary | ICD-10-CM

## 2024-11-14 ENCOUNTER — Other Ambulatory Visit: Payer: Self-pay | Admitting: Family Medicine

## 2024-12-06 ENCOUNTER — Encounter: Payer: Self-pay | Admitting: Cardiology

## 2024-12-09 ENCOUNTER — Other Ambulatory Visit: Payer: Self-pay | Admitting: Nurse Practitioner

## 2024-12-15 ENCOUNTER — Ambulatory Visit (HOSPITAL_COMMUNITY)
Admission: RE | Admit: 2024-12-15 | Discharge: 2024-12-15 | Disposition: A | Discharge status: Home / Self Care | Source: Ambulatory Visit | Attending: Pulmonary Disease | Admitting: Pulmonary Disease

## 2024-12-15 DIAGNOSIS — I898 Other specified noninfective disorders of lymphatic vessels and lymph nodes: Secondary | ICD-10-CM | POA: Insufficient documentation

## 2024-12-15 DIAGNOSIS — J8410 Pulmonary fibrosis, unspecified: Secondary | ICD-10-CM | POA: Diagnosis present

## 2024-12-16 ENCOUNTER — Other Ambulatory Visit: Payer: Self-pay | Admitting: Family Medicine

## 2024-12-25 ENCOUNTER — Telehealth: Payer: Self-pay

## 2024-12-25 NOTE — Telephone Encounter (Signed)
 Copied from CRM #[id number]. Topic: Clinical - Lab/Test Results >> Dec 25, 2024  2:48 PM Isabell A wrote: Reason for CRM: Patient is requesting for Dr.Dewald to release his results for CT Chest Wo Contrast (Accession [id number]) (Order [id number]) so he can view it on MyChart.  Called and spoke with the pt and advised CT scan has not been read yet is why he is unable to view it in Mychart. Pt verbalized understanding. Nothing further needed.

## 2024-12-28 ENCOUNTER — Ambulatory Visit: Payer: Self-pay | Admitting: Pulmonary Disease
